# Patient Record
Sex: Female | Born: 1965 | Race: White | Hispanic: No | Marital: Single | Smoking: Never smoker
Health system: Southern US, Community
[De-identification: ages and names within clinical notes are randomized; demographics above are authoritative.]

## PROBLEM LIST (undated history)

## (undated) DIAGNOSIS — Z5189 Encounter for other specified aftercare: Secondary | ICD-10-CM

## (undated) DIAGNOSIS — D649 Anemia, unspecified: Secondary | ICD-10-CM

## (undated) DIAGNOSIS — E079 Disorder of thyroid, unspecified: Secondary | ICD-10-CM

## (undated) DIAGNOSIS — R7303 Prediabetes: Secondary | ICD-10-CM

## (undated) DIAGNOSIS — B001 Herpesviral vesicular dermatitis: Secondary | ICD-10-CM

## (undated) DIAGNOSIS — R000 Tachycardia, unspecified: Secondary | ICD-10-CM

## (undated) DIAGNOSIS — D509 Iron deficiency anemia, unspecified: Secondary | ICD-10-CM

## (undated) DIAGNOSIS — T7840XA Allergy, unspecified, initial encounter: Secondary | ICD-10-CM

## (undated) DIAGNOSIS — E049 Nontoxic goiter, unspecified: Secondary | ICD-10-CM

## (undated) DIAGNOSIS — E039 Hypothyroidism, unspecified: Secondary | ICD-10-CM

## (undated) DIAGNOSIS — C801 Malignant (primary) neoplasm, unspecified: Secondary | ICD-10-CM

## (undated) HISTORY — DX: Allergy, unspecified, initial encounter: T78.40XA

## (undated) HISTORY — DX: Prediabetes: R73.03

## (undated) HISTORY — PX: APPENDECTOMY: SHX54

## (undated) HISTORY — DX: Disorder of thyroid, unspecified: E07.9

## (undated) HISTORY — DX: Herpesviral vesicular dermatitis: B00.1

## (undated) HISTORY — DX: Nontoxic goiter, unspecified: E04.9

## (undated) HISTORY — DX: Encounter for other specified aftercare: Z51.89

## (undated) HISTORY — DX: Hypothyroidism, unspecified: E03.9

## (undated) HISTORY — DX: Anemia, unspecified: D64.9

---

## 2006-02-14 HISTORY — PX: KNEE ARTHROSCOPY: SUR90

## 2006-02-14 HISTORY — PX: KNEE SURGERY: SHX244

## 2007-01-04 HISTORY — PX: KNEE ARTHROSCOPY: SUR90

## 2008-05-15 ENCOUNTER — Inpatient Hospital Stay: Payer: Self-pay | Admitting: General Surgery

## 2008-08-09 ENCOUNTER — Emergency Department: Payer: Self-pay | Admitting: Emergency Medicine

## 2009-05-21 HISTORY — PX: BREAST BIOPSY: SHX20

## 2013-02-14 HISTORY — PX: BREAST BIOPSY: SHX20

## 2014-04-30 ENCOUNTER — Ambulatory Visit (INDEPENDENT_AMBULATORY_CARE_PROVIDER_SITE_OTHER): Payer: BC Managed Care – PPO

## 2014-04-30 ENCOUNTER — Encounter: Payer: Self-pay | Admitting: Podiatry

## 2014-04-30 ENCOUNTER — Ambulatory Visit (INDEPENDENT_AMBULATORY_CARE_PROVIDER_SITE_OTHER): Payer: BC Managed Care – PPO | Admitting: Podiatry

## 2014-04-30 VITALS — BP 119/80 | HR 82 | Resp 16 | Ht 67.5 in | Wt 165.0 lb

## 2014-04-30 DIAGNOSIS — G5782 Other specified mononeuropathies of left lower limb: Secondary | ICD-10-CM

## 2014-04-30 DIAGNOSIS — M779 Enthesopathy, unspecified: Secondary | ICD-10-CM

## 2014-04-30 DIAGNOSIS — G5762 Lesion of plantar nerve, left lower limb: Secondary | ICD-10-CM | POA: Diagnosis not present

## 2014-04-30 MED ORDER — MELOXICAM 15 MG PO TABS: 15.0000 mg | ORAL_TABLET | Freq: Every day | ORAL | Status: DC

## 2014-04-30 NOTE — Progress Notes (Signed)
   Subjective:    Patient ID: Annette Phillips, female    DOB: 1965-06-09, 49 y.o.   MRN: [id number]  HPI  It started oct/ nov i was redecorating my sons room and i was standing on a five gallon bucket . And the foot started to hurt. 4th met left foot. 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 Martin Majestic to [facility] and they did no xrays or anything. Gave me two alcohol injections my insurance will not cover that so we did a cortisone injection.  Gave me a pair of inserts that hurt more than it helped.    Review of Systems  All other systems reviewed and are negative.      Objective:   Physical Exam: I have reviewed her past history medications allergies surgery social history and review of systems. Pulses are strongly palpable bilateral. Neurologic sensorium is intact per Semmes-Weinstein monofilament. Palpable Mulder's click to the third interdigital space of the left foot with pain. Deep tendon reflexes are intact bilaterally muscle strength is 5 over 5 dorsiflexion plantar flexors and inverters everters onto the musculature is intact. Orthopedic evaluation of his result was distal to the ankle and full range of motion without crepitation. Cutaneous evaluation Mr. supple well-hydrated cutis no erythema edema cellulitis drainage or odor. Radiographs confirm normal rectus foot type without complications without fractures.        Assessment & Plan:  Assessment: Neuroma third interdigital space left foot.  Plan: We discussed the etiology pathology conservative versus surgical therapies. We discussed appropriate shoe gear stretching exercises ice therapy and shoe modifications. We discussed in great detail our options. I offered her alcohol and cortisone injections and she declined. We started her on meloxicam 15 mg 1 by mouth daily 30 with 3 refills. I also had her scan for set of orthotics. We did  discuss the possible need for surgical intervention.

## 2014-05-26 ENCOUNTER — Ambulatory Visit: Payer: BC Managed Care – PPO

## 2014-05-26 ENCOUNTER — Ambulatory Visit: Payer: BC Managed Care – PPO | Admitting: Podiatry

## 2014-05-28 ENCOUNTER — Ambulatory Visit (INDEPENDENT_AMBULATORY_CARE_PROVIDER_SITE_OTHER): Payer: BC Managed Care – PPO | Admitting: *Deleted

## 2014-05-28 DIAGNOSIS — G5782 Other specified mononeuropathies of left lower limb: Secondary | ICD-10-CM

## 2014-05-28 DIAGNOSIS — G5762 Lesion of plantar nerve, left lower limb: Secondary | ICD-10-CM

## 2014-05-28 DIAGNOSIS — M779 Enthesopathy, unspecified: Secondary | ICD-10-CM

## 2014-05-28 NOTE — Progress Notes (Signed)
Orthotics dispensed. Instructions given. Will see in 1 mo. for recheck, if needed.

## 2014-05-28 NOTE — Patient Instructions (Signed)

## 2014-06-09 ENCOUNTER — Encounter: Payer: Self-pay | Admitting: Podiatry

## 2014-06-09 ENCOUNTER — Ambulatory Visit (INDEPENDENT_AMBULATORY_CARE_PROVIDER_SITE_OTHER): Payer: BC Managed Care – PPO | Admitting: Podiatry

## 2014-06-09 VITALS — Ht 67.0 in | Wt 170.0 lb

## 2014-06-09 DIAGNOSIS — G5782 Other specified mononeuropathies of left lower limb: Secondary | ICD-10-CM

## 2014-06-09 DIAGNOSIS — G5762 Lesion of plantar nerve, left lower limb: Secondary | ICD-10-CM

## 2014-06-09 NOTE — Progress Notes (Signed)
She presents today for follow-up of her neuroma bilaterally. She states that his lungs are taken medication neuroma feels fine. However the orthotics seem to bother her particularly with metatarsal pad.  Objective: Vital signs are stable alert and oriented 3. Pulses are palpable bilateral. She has tenderness on palpation third interspace bilateral. Orthotics appear to fit well however the metatarsal pad does extend too far proximal into the mid arch.  Assessment: Neuroma bilateral.  Plan: We are sending her orthotics back to have the met pad removed. Once these return we will notify her that she may pick them up.

## 2014-06-30 ENCOUNTER — Ambulatory Visit: Payer: BC Managed Care – PPO | Admitting: Podiatry

## 2014-07-11 LAB — HM MAMMOGRAPHY

## 2015-10-15 LAB — HM COLONOSCOPY

## 2016-04-27 DIAGNOSIS — E039 Hypothyroidism, unspecified: Secondary | ICD-10-CM | POA: Insufficient documentation

## 2016-07-12 DIAGNOSIS — E785 Hyperlipidemia, unspecified: Secondary | ICD-10-CM | POA: Insufficient documentation

## 2016-12-02 ENCOUNTER — Emergency Department
Admission: EM | Admit: 2016-12-02 | Discharge: 2016-12-03 | Disposition: A | Discharge status: Home / Self Care | Payer: BC Managed Care – PPO | Attending: Emergency Medicine | Admitting: Emergency Medicine

## 2016-12-02 DIAGNOSIS — R112 Nausea with vomiting, unspecified: Secondary | ICD-10-CM | POA: Insufficient documentation

## 2016-12-02 DIAGNOSIS — R42 Dizziness and giddiness: Secondary | ICD-10-CM | POA: Diagnosis present

## 2016-12-02 DIAGNOSIS — Z79899 Other long term (current) drug therapy: Secondary | ICD-10-CM | POA: Insufficient documentation

## 2016-12-03 ENCOUNTER — Emergency Department: Payer: BC Managed Care – PPO

## 2016-12-03 LAB — URINALYSIS, COMPLETE (UACMP) WITH MICROSCOPIC
Bilirubin Urine: NEGATIVE
GLUCOSE, UA: 50 mg/dL — AB
Hgb urine dipstick: NEGATIVE
Ketones, ur: NEGATIVE mg/dL
Leukocytes, UA: NEGATIVE
NITRITE: NEGATIVE
PROTEIN: NEGATIVE mg/dL
Specific Gravity, Urine: 1.014 (ref 1.005–1.030)
pH: 8 (ref 5.0–8.0)

## 2016-12-03 LAB — COMPREHENSIVE METABOLIC PANEL
ALK PHOS: 54 U/L (ref 38–126)
ALT: 24 U/L (ref 14–54)
AST: 25 U/L (ref 15–41)
Albumin: 4.2 g/dL (ref 3.5–5.0)
Anion gap: 9 (ref 5–15)
BUN: 19 mg/dL (ref 6–20)
CALCIUM: 9 mg/dL (ref 8.9–10.3)
CO2: 25 mmol/L (ref 22–32)
CREATININE: 0.9 mg/dL (ref 0.44–1.00)
Chloride: 102 mmol/L (ref 101–111)
GLUCOSE: 158 mg/dL — AB (ref 65–99)
Potassium: 3.9 mmol/L (ref 3.5–5.1)
SODIUM: 136 mmol/L (ref 135–145)
Total Bilirubin: 0.5 mg/dL (ref 0.3–1.2)
Total Protein: 7.8 g/dL (ref 6.5–8.1)

## 2016-12-03 LAB — GLUCOSE, CAPILLARY: Glucose-Capillary: 168 mg/dL — ABNORMAL HIGH (ref 65–99)

## 2016-12-03 LAB — CBC
HEMATOCRIT: 37.1 % (ref 35.0–47.0)
Hemoglobin: 12.6 g/dL (ref 12.0–16.0)
MCH: 29.4 pg (ref 26.0–34.0)
MCHC: 34 g/dL (ref 32.0–36.0)
MCV: 86.3 fL (ref 80.0–100.0)
Platelets: 233 10*3/uL (ref 150–440)
RBC: 4.3 MIL/uL (ref 3.80–5.20)
RDW: 13.2 % (ref 11.5–14.5)
WBC: 15.7 10*3/uL — ABNORMAL HIGH (ref 3.6–11.0)

## 2016-12-03 LAB — TROPONIN I: Troponin I: <0.03 ng/mL (ref <0.03)

## 2016-12-03 LAB — LIPASE, BLOOD: Lipase: 26 U/L (ref 11–51)

## 2016-12-03 MED ORDER — MECLIZINE HCL 25 MG PO TABS: 25.0000 mg | ORAL_TABLET | Freq: Three times a day (TID) | ORAL | 0 refills | Status: DC | PRN

## 2016-12-03 MED ORDER — MECLIZINE HCL 25 MG PO TABS
25.0000 mg | ORAL_TABLET | Freq: Once | ORAL | Status: AC
  Administered 2016-12-03: 25 mg via ORAL
  Filled 2016-12-03: qty 1

## 2016-12-03 MED ORDER — ONDANSETRON HCL 4 MG/2ML IJ SOLN
INTRAMUSCULAR | Status: AC
  Administered 2016-12-03: 4 mg via INTRAVENOUS
  Filled 2016-12-03: qty 2

## 2016-12-03 MED ORDER — PROCHLORPERAZINE EDISYLATE 5 MG/ML IJ SOLN
10.0000 mg | INTRAMUSCULAR | Status: AC
  Administered 2016-12-03: 10 mg via INTRAVENOUS
  Filled 2016-12-03: qty 2

## 2016-12-03 MED ORDER — SODIUM CHLORIDE 0.9 % IV BOLUS (SEPSIS)
500.0000 mL | INTRAVENOUS | Status: AC
  Administered 2016-12-03: 500 mL via INTRAVENOUS

## 2016-12-03 MED ORDER — DIPHENHYDRAMINE HCL 50 MG/ML IJ SOLN
12.5000 mg | INTRAMUSCULAR | Status: AC
  Administered 2016-12-03: 12.5 mg via INTRAVENOUS
  Filled 2016-12-03: qty 1

## 2016-12-03 MED ORDER — ONDANSETRON HCL 4 MG/2ML IJ SOLN
4.0000 mg | Freq: Once | INTRAMUSCULAR | Status: AC
  Administered 2016-12-03: 4 mg via INTRAVENOUS

## 2016-12-03 NOTE — ED Notes (Signed)
Patient transported to MRI 

## 2016-12-03 NOTE — ED Triage Notes (Signed)
Per EMS: patient called out for nausea, emesis and dizziness. Patient had eaten dinner at 1800, began to become nauseated and vomit at approx 2300. Patient pale, diaphoretic, and clammy at EMS arrival.  Patient denies pain.

## 2016-12-03 NOTE — ED Notes (Signed)
Patient given 200 mL NaCl and 4 mg of Zofran by EMS

## 2016-12-03 NOTE — ED Notes (Signed)
Ambulated patient to the bathroom with NT Scott. Patient very unsteady on feet. MD informed

## 2016-12-03 NOTE — ED Notes (Signed)
Patient instructed that urine specimen is needed. Patient reports she will alert RN when able to give specimen

## 2016-12-03 NOTE — ED Notes (Signed)
Reviewed discharge instructions, follow-up care, prescription. Patient verbalized understanding.

## 2016-12-03 NOTE — ED Notes (Signed)
ED Provider at bedside. 

## 2016-12-03 NOTE — Discharge Instructions (Signed)
We believe your symptoms were caused by benign vertigo.  Please read through the included information and take any prescribed medication(s).  Follow up with your doctor as listed above.  If you develop any new or worsening symptoms that concern you, including but not limited to persistent dizziness/vertigo, numbness or weakness in your arms or legs, altered mental status, persistent vomiting, or fever greater than 101, please return immediately to the Emergency Department.  

## 2016-12-03 NOTE — ED Provider Notes (Signed)
[facility] Emergency Department Provider Note  ____________________________________________   First MD Initiated Contact with Patient 12/03/16 [phone]     (approximate)  I have reviewed the triage vital signs and the nursing notes.   HISTORY  Chief Complaint Emesis and Dizziness    HPI Annette Phillips is a 51 y.o. female Whose only medical history includes hypothyroidism on Synthroidwho presents by EMS for evaluation of acute onset dizziness with persistent vomiting.  This occurred a few hours ago when she was sitting on the couch and then got up and the dizziness/vertigo began acutely and was severe.  She states she has been vomiting for hours.  As long as she remains absolutely still she feels better, but any movement of her head causes her to become very nauseated, dizzy (room spinning), and to frequently vomit.  Nothing makes her symptoms better except for remaining still.  She denies any recent illnesses and denies fever/chills, chest pain, shortness of breath, abdominal pain, and dysuria.  she has never had symptoms like this before.  She has had no numbness nor tingling in any of her extremities.  She has not had any recent ear pain or infections.  she has no neck pain or stiffness.   Past Medical History:  Diagnosis Date  . Thyroid disease     There are no active problems to display for this patient.   Past Surgical History:  Procedure Laterality Date  . APPENDECTOMY    . KNEE SURGERY  2008    Prior to Admission medications   Medication Sig Start Date End Date Taking? Authorizing Provider  meclizine (ANTIVERT) 25 MG tablet Take 1 tablet (25 mg total) by mouth 3 (three) times daily as needed for dizziness. 12/03/16   Loleta Rose, MD  meloxicam (MOBIC) 15 MG tablet Take 1 tablet (15 mg total) by mouth daily. 04/30/14   Hyatt, Max T, DPM  Multiple Vitamin (MULTI-VITAMINS) TABS Take by mouth.    [provider]  SYNTHROID 100 MCG tablet   03/19/14   [provider]    Allergies Aspirin and Penicillin g  No family history on file.  Social History Social History  Substance Use Topics  . Smoking status: Never Smoker  . Smokeless tobacco: Never Used  . Alcohol use Not on file    Review of Systems Constitutional: No fever/chills Eyes: No visual changes. ENT: No sore throat. Cardiovascular: Denies chest pain. Respiratory: Denies shortness of breath. Gastrointestinal: No abdominal pain.  persistent vomiting and nausea that seems to be associated with movement of her head.  No diarrhea.  No constipation. Genitourinary: Negative for dysuria. Musculoskeletal: Negative for neck pain.  Negative for back pain. Integumentary: Negative for rash. Neurological: severe dizziness/vertigo. Negative for headaches, focal weakness or numbness.   ____________________________________________   PHYSICAL EXAM:  VITAL SIGNS: ED Triage Vitals  Enc Vitals Group     BP 12/03/16 0007 (!) 163/106     Pulse Rate 12/03/16 0007 89     Resp 12/03/16 0007 13     Temp 12/03/16 0007 97.6 F (36.4 C)     Temp Source 12/03/16 0007 Oral     SpO2 12/03/16 0001 96 %     Weight 12/03/16 0003 77.1 kg (170 lb)     Height --      Head Circumference --      Peak Flow --      Pain Score --      Pain Loc --  Pain Edu? --      Excl. in GC? --     Constitutional: Alert and oriented. appears uncomfortable and pale but nontoxic Eyes: Conjunctivae are normal. PERRL. EOMI. no nystagmus. Head: Atraumatic. Nose: No congestion/rhinnorhea. Mouth/Throat: Mucous membranes are moist. Neck: No stridor.  No meningeal signs.   Cardiovascular: Normal rate, regular rhythm. Good peripheral circulation. Grossly normal heart sounds. Respiratory: Normal respiratory effort.  No retractions. Lungs CTAB. Gastrointestinal: Soft and nontender. No distention.  Musculoskeletal: No lower extremity tenderness nor edema. No gross deformities of  extremities. Neurologic:  Normal speech and language. No gross focal neurologic deficits are appreciated. when I have the patient turn her head to the left or right she immediately becomes severely nauseated. Skin:  Skin is warm, dry and intact. No rash noted. Psychiatric: Mood and affect are normal. Speech and behavior are normal.  ____________________________________________   LABS (all labs ordered are listed, but only abnormal results are displayed)  Labs Reviewed  COMPREHENSIVE METABOLIC PANEL - Abnormal; Notable for the following:       Result Value   Glucose, Bld 158 (*)    All other components within normal limits  CBC - Abnormal; Notable for the following:    WBC 15.7 (*)    All other components within normal limits  URINALYSIS, COMPLETE (UACMP) WITH MICROSCOPIC - Abnormal; Notable for the following:    Color, Urine YELLOW (*)    APPearance CLOUDY (*)    Glucose, UA 50 (*)    Bacteria, UA RARE (*)    Squamous Epithelial / LPF 0-5 (*)    All other components within normal limits  GLUCOSE, CAPILLARY - Abnormal; Notable for the following:    Glucose-Capillary 168 (*)    All other components within normal limits  LIPASE, BLOOD  TROPONIN I  CBG MONITORING, ED   ____________________________________________  EKG  ED ECG REPORT I, Harace Mccluney, the attending physician, personally viewed and interpreted this ECG.  Date: 12/03/2016 EKG Time: 00:05 Rate: 89 Rhythm: normal sinus rhythm QRS Axis: normal Intervals: normal ST/T Wave abnormalities: normal Narrative Interpretation: Non-specific ST segment / T-wave changes, but no evidence of acute ischemia.   ____________________________________________  RADIOLOGY   Mr Angiogram Head Wo Contrast  Result Date: 12/03/2016 CLINICAL DATA:  51 y/o F; nausea, emesis, and dizziness, suspected stroke. EXAM: MRI HEAD WITHOUT CONTRAST MRA HEAD WITHOUT CONTRAST TECHNIQUE: Multiplanar, multiecho pulse sequences of the brain and  surrounding structures were obtained without intravenous contrast. Angiographic images of the head were obtained using MRA technique without contrast. COMPARISON:  None. FINDINGS: MRI HEAD FINDINGS Brain: No acute infarction, hemorrhage, hydrocephalus, extra-axial collection or mass lesion. Vascular: As below. Skull and upper cervical spine: Normal marrow signal. Sinuses/Orbits: Right maxillary sinus mucous retention cyst. Otherwise negative. Other: None. MRA HEAD FINDINGS Internal carotid arteries:  Patent. Anterior cerebral arteries: Patent bilateral ACA distributions. Large right A1, diminutive left A1, and large anterior communicating artery, normal variant. Middle cerebral arteries: Patent. Anterior communicating artery: Patent. Posterior communicating arteries: Patent right. No left identified, likely hypoplastic or absent. Posterior cerebral arteries:  Patent. Basilar artery:  Patent. Vertebral arteries:  Patent. No evidence of high-grade stenosis, large vessel occlusion, or aneurysm unless noted above. IMPRESSION: MRI head: 1. No acute intracranial abnormality. 2. Unremarkable MRI of the brain for age. MRA head Patent circle of Willis. No large vessel occlusion, aneurysm, or significant stenosis is identified. Electronically Signed   By: Mitzi HansenLance  Furusawa-Stratton M.D.   On: 12/03/2016 04:09   Mr Brain Ilda BassetWo  Contrast  Result Date: 12/03/2016 CLINICAL DATA:  51 y/o F; nausea, emesis, and dizziness, suspected stroke. EXAM: MRI HEAD WITHOUT CONTRAST MRA HEAD WITHOUT CONTRAST TECHNIQUE: Multiplanar, multiecho pulse sequences of the brain and surrounding structures were obtained without intravenous contrast. Angiographic images of the head were obtained using MRA technique without contrast. COMPARISON:  None. FINDINGS: MRI HEAD FINDINGS Brain: No acute infarction, hemorrhage, hydrocephalus, extra-axial collection or mass lesion. Vascular: As below. Skull and upper cervical spine: Normal marrow signal.  Sinuses/Orbits: Right maxillary sinus mucous retention cyst. Otherwise negative. Other: None. MRA HEAD FINDINGS Internal carotid arteries:  Patent. Anterior cerebral arteries: Patent bilateral ACA distributions. Large right A1, diminutive left A1, and large anterior communicating artery, normal variant. Middle cerebral arteries: Patent. Anterior communicating artery: Patent. Posterior communicating arteries: Patent right. No left identified, likely hypoplastic or absent. Posterior cerebral arteries:  Patent. Basilar artery:  Patent. Vertebral arteries:  Patent. No evidence of high-grade stenosis, large vessel occlusion, or aneurysm unless noted above. IMPRESSION: MRI head: 1. No acute intracranial abnormality. 2. Unremarkable MRI of the brain for age. MRA head Patent circle of Willis. No large vessel occlusion, aneurysm, or significant stenosis is identified. Electronically Signed   By: Mitzi Hansen M.D.   On: 12/03/2016 04:09    ____________________________________________   PROCEDURES  Critical Care performed: No   Procedure(s) performed:   Procedures   ____________________________________________   INITIAL IMPRESSION / ASSESSMENT AND PLAN / ED COURSE  As part of my medical decision making, I reviewed the following data within the electronic MEDICAL RECORD NUMBER Nursing notes reviewed and incorporated and Labs reviewed     Differential diagnosis includes intracranial hemorrhage, CVA/TIA, peripheral vertigo, cerebral vascular insufficiency, acute viral illness.  Less likely is an acute intra-abdominal pathology given that the patient is asymptomatic as long she is not moving.  I had my usual customary vertigo discussion with the patient and family.  Initially we will hold off on MRI given that this is most likely peripheral and I will give a dose of meclizine and some IV fluids and then reassess the need for additional medication and/or imaging.  Blood work is pending.  Clinical  Course as of Dec 04 506  Sat Dec 03, 2016  0139 patient still vomiting after2 rounds of Zofran 4 mg IV.  Given the severity of her symptoms and her difficulty even with assisted ambulation, I will check an MRI/A to rule out acute CVA and posterior circulation deficiency.  Ordering Compazine 10 mg IV with Benadryl 12.5 mg IV to assist with (thus far) intractable vomiting.  [CF]  0157 labs including UA are essentially unremarkable except for a leukocytosis of about 16.  [CF]  [phone] no evidence of acute intracranial abnormality on MRI/A MR Brain Wo Contrast [CF]  0505 the patient feels significantly better and was able to ambulate without assistance to and from the bathroom without any repeat of her vertigo/dizziness.  I updated her with the reassuring MRI findings and offered continued observation and care here at [facility] versus going home, and she prefers to go home.  Discharging with meclizine Rx and my usual/customary return precautions.  [CF]    Clinical Course User Index [CF] Loleta Rose, MD    ____________________________________________  FINAL CLINICAL IMPRESSION(S) / ED DIAGNOSES  Final diagnoses:  Vertigo     MEDICATIONS GIVEN DURING THIS VISIT:  Medications  ondansetron (ZOFRAN) injection 4 mg (4 mg Intravenous Given 12/03/16 0029)  meclizine (ANTIVERT) tablet 25 mg (25 mg Oral  Given 12/03/16 0138)  sodium chloride 0.9 % bolus 500 mL (0 mLs Intravenous Stopped 12/03/16 0230)  prochlorperazine (COMPAZINE) injection 10 mg (10 mg Intravenous Given 12/03/16 0143)  diphenhydrAMINE (BENADRYL) injection 12.5 mg (12.5 mg Intravenous Given 12/03/16 0143)     NEW OUTPATIENT MEDICATIONS STARTED DURING THIS VISIT:  New Prescriptions   MECLIZINE (ANTIVERT) 25 MG TABLET    Take 1 tablet (25 mg total) by mouth 3 (three) times daily as needed for dizziness.    Modified Medications   No medications on file    Discontinued Medications   No medications on  file     Note:  This document was prepared using Dragon voice recognition software and may include unintentional dictation errors.    Loleta Rose, MD 12/03/16 [phone]

## 2016-12-18 DIAGNOSIS — H6990 Unspecified Eustachian tube disorder, unspecified ear: Secondary | ICD-10-CM | POA: Insufficient documentation

## 2016-12-18 DIAGNOSIS — H6980 Other specified disorders of Eustachian tube, unspecified ear: Secondary | ICD-10-CM | POA: Insufficient documentation

## 2016-12-18 DIAGNOSIS — R42 Dizziness and giddiness: Secondary | ICD-10-CM | POA: Insufficient documentation

## 2017-07-13 LAB — TSH: TSH: 0.59 (ref 0.41–5.90)

## 2017-07-13 LAB — LIPID PANEL
Cholesterol: 170 (ref 0–200)
HDL: 41 (ref 35–70)
LDL Cholesterol: 106
Triglycerides: 129 (ref 40–160)

## 2017-07-13 LAB — COMPREHENSIVE METABOLIC PANEL
Albumin: 4.2 (ref 3.5–5.0)
Calcium: 9.8 (ref 8.7–10.7)
Globulin: 2.8

## 2017-07-13 LAB — HEPATIC FUNCTION PANEL
ALT: 27 (ref 7–35)
AST: 33 (ref 13–35)
Alkaline Phosphatase: 58 (ref 25–125)
Bilirubin, Total: 0.3

## 2017-07-13 LAB — BASIC METABOLIC PANEL
BUN: 21 (ref 4–21)
Creatinine: 0.8 (ref 0.5–1.1)
Glucose: 99

## 2017-07-13 LAB — CBC: RBC: 4.39 (ref 3.87–5.11)

## 2017-07-13 LAB — CBC AND DIFFERENTIAL
HCT: 39 (ref 36–46)
Hemoglobin: 12.7 (ref 12.0–16.0)
Platelets: 239 (ref 150–399)
WBC: 6.5

## 2017-07-17 LAB — HM PAP SMEAR: HM Pap smear: NORMAL

## 2017-10-25 ENCOUNTER — Ambulatory Visit (INDEPENDENT_AMBULATORY_CARE_PROVIDER_SITE_OTHER): Payer: BC Managed Care – PPO

## 2017-10-25 ENCOUNTER — Encounter: Payer: Self-pay | Admitting: Podiatry

## 2017-10-25 ENCOUNTER — Ambulatory Visit: Payer: BC Managed Care – PPO | Admitting: Podiatry

## 2017-10-25 DIAGNOSIS — M779 Enthesopathy, unspecified: Secondary | ICD-10-CM

## 2017-10-25 DIAGNOSIS — M778 Other enthesopathies, not elsewhere classified: Secondary | ICD-10-CM

## 2017-10-25 NOTE — Progress Notes (Signed)
  Subjective:  Patient ID: Annette Phillips, female    DOB: Apr 01, 1965,  MRN: [id number] HPI Chief Complaint  Patient presents with  . Foot Pain    Patient presents today for flare up of neuroma left foot under 2nd toe/forefoot x 2-3 months.  She reports the pain is stabbing and comes and goes.  She reports today is a good day.  She has been using her orthotics and hard soled shoes which help some.    52 y.o. female presents with the above complaint.   ROS: Denies fever chills nausea vomiting muscle aches pains calf pain back pain chest pain shortness of breath.  Past Medical History:  Diagnosis Date  . Thyroid disease    Past Surgical History:  Procedure Laterality Date  . APPENDECTOMY    . KNEE SURGERY  2008    Current Outpatient Medications:  Marland Kitchen  Multiple Vitamin (MULTI-VITAMINS) TABS, Take by mouth., Disp: , Rfl:  .  SYNTHROID 100 MCG tablet, , Disp: , Rfl:   Allergies  Allergen Reactions  . Aspirin Other (See Comments)  . Penicillin G Other (See Comments)   Review of Systems Objective:  There were no vitals filed for this visit.  General: Well developed, nourished, in no acute distress, alert and oriented x3   Dermatological: Skin is warm, dry and supple bilateral. Nails x 10 are well maintained; remaining integument appears unremarkable at this time. There are no open sores, no preulcerative lesions, no rash or signs of infection present.  Vascular: Dorsalis Pedis artery and Posterior Tibial artery pedal pulses are 2/4 bilateral with immedate capillary fill time. Pedal hair growth present. No varicosities and no lower extremity edema present bilateral.   Neruologic: Grossly intact via light touch bilateral. Vibratory intact via tuning fork bilateral. Protective threshold with Semmes Wienstein monofilament intact to all pedal sites bilateral. Patellar and Achilles deep tendon reflexes 2+ bilateral. No Babinski or clonus noted bilateral.   Musculoskeletal: No gross boney  pedal deformities bilateral. No pain, crepitus, or limitation noted with foot and ankle range of motion bilateral. Muscular strength 5/5 in all groups tested bilateral.  Pain on end range of motion of the second metatarsal phalangeal joint of the left foot with mild soft tissue swelling. Gait: Unassisted, Nonantalgic.    Radiographs:  No acute findings  Assessment & Plan:   Assessment: Capsulitis plantar flexed second metatarsal phalangeal joint left foot  Plan: Referred her to [facility] for a pair of orthotics to be made.  She will need to cut out beneath the second metatarsal phalangeal joint.     [place], [place]

## 2017-11-01 ENCOUNTER — Ambulatory Visit (INDEPENDENT_AMBULATORY_CARE_PROVIDER_SITE_OTHER): Payer: BC Managed Care – PPO | Admitting: Orthotics

## 2017-11-01 DIAGNOSIS — M778 Other enthesopathies, not elsewhere classified: Secondary | ICD-10-CM

## 2017-11-01 DIAGNOSIS — M779 Enthesopathy, unspecified: Secondary | ICD-10-CM | POA: Diagnosis not present

## 2017-11-01 NOTE — Progress Notes (Signed)
Patient presents today per Dr. Milinda Pointer for new CMFO; she has Richie in past and they have done her well; however they are worn out; in addition she needs more cushioning and 2nd met left

## 2017-11-22 ENCOUNTER — Ambulatory Visit: Payer: BC Managed Care – PPO | Admitting: Orthotics

## 2017-11-22 DIAGNOSIS — M779 Enthesopathy, unspecified: Principal | ICD-10-CM

## 2017-11-22 DIAGNOSIS — M778 Other enthesopathies, not elsewhere classified: Secondary | ICD-10-CM

## 2017-11-22 NOTE — Progress Notes (Addendum)
Patient came in today to pick up custom made foot orthotics.  The goals were accomplished and the patient reported no dissatisfaction with said orthotics.  Patient was advised of breakin period and how to report any issues.  Patient was unsure about met pad. If necessary we can remove.

## 2018-11-16 IMAGING — MR MR HEAD W/O CM
9 of 11 series · 32 of 48 positions shown · non-contrast
Comparison: None.

CLINICAL DATA: 50 y/o F; nausea, emesis, and dizziness, suspected
stroke.

EXAM:
MRI HEAD WITHOUT CONTRAST
MRA HEAD WITHOUT CONTRAST
TECHNIQUE: Multiplanar, multiecho pulse sequences of the brain and surrounding
structures were obtained without intravenous contrast. Angiographic
images of the head were obtained using MRA technique without
contrast.

[Series 2: GRE · sagittal · 5.0mm · 0.45mm/px · 2 of 21 slices shown (1 of 2)]
[im 1/21]
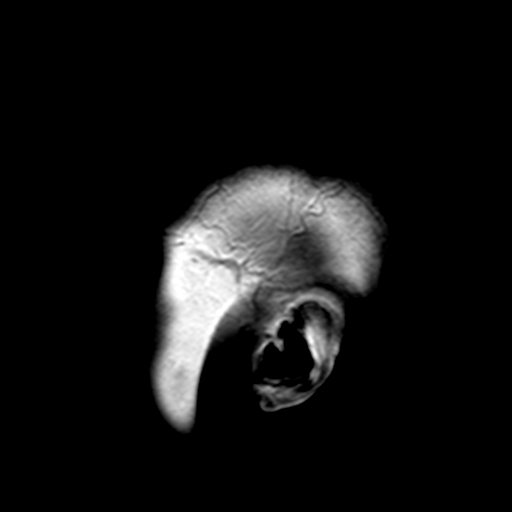
[im 21/21]
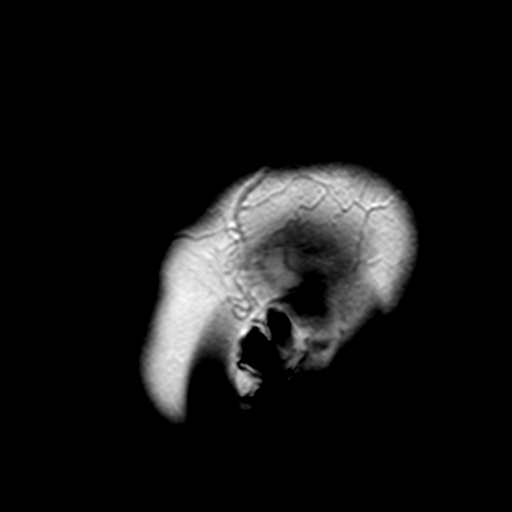

[Series 4: DWI · axial · 3.0mm · 1.80mm/px · z∈[-39,+104]mm · 5 of 48 slices shown (1 of 2)]
[im 1/48]
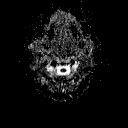
[im 12/48]
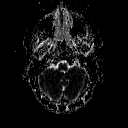
[im 24/48]
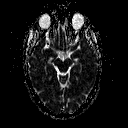
[im 36/48]
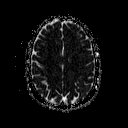
[im 48/48]
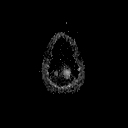

[Series 6: DWI · coronal · 3.0mm · 1.80mm/px · 4 of 44 slices shown (2 of 2)]
[im 1/44]
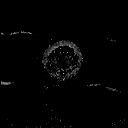
[im 15/44]
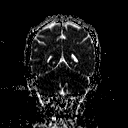
[im 29/44]
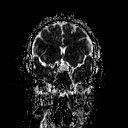
[im 44/44]
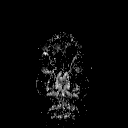

[Series 7: TOF · axial · non-contrast · 0.7mm · 0.37mm/px · z∈[-12,+17]mm · 4 of 110 slices shown]
[im 1/110]
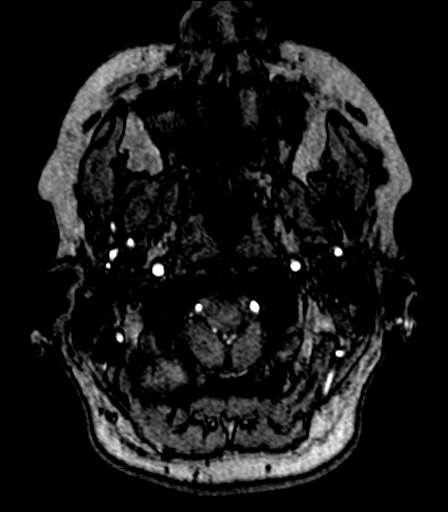
[im 22/110]
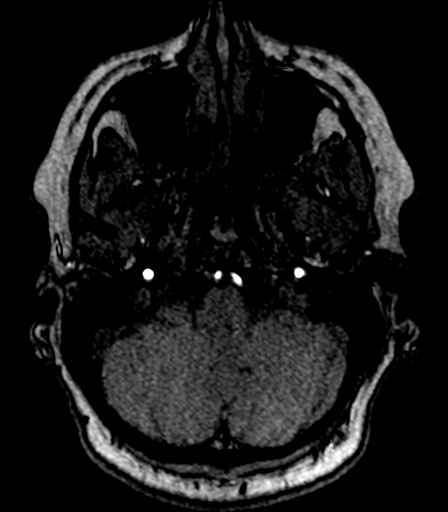
[im 33/110]
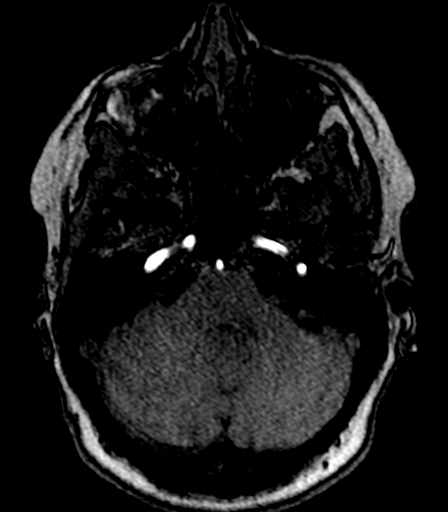
[im 44/110]
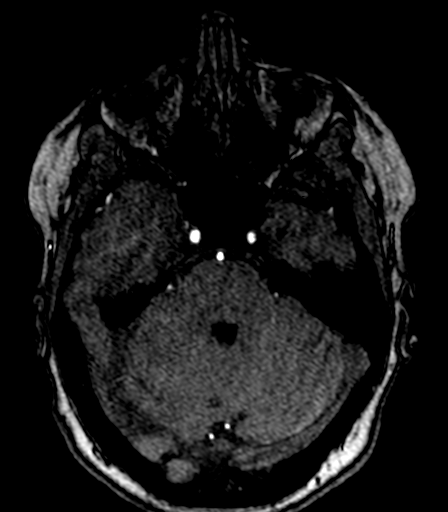

[Series 11: T2 · axial · 5.0mm · 0.45mm/px · z∈[-43,+109]mm · 3 of 25 slices shown (1 of 3)]
[im 1/25]
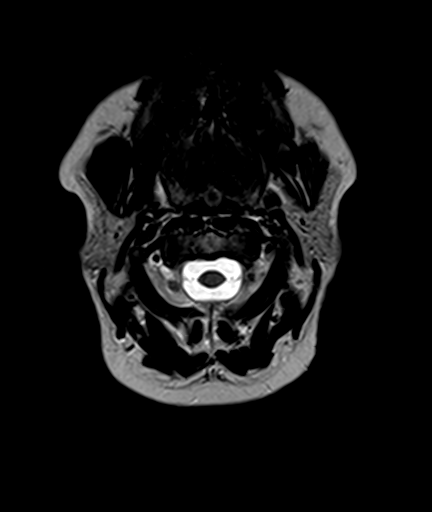
[im 13/25]
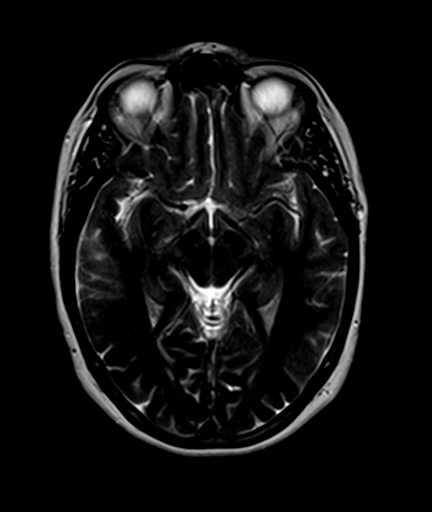
[im 25/25]
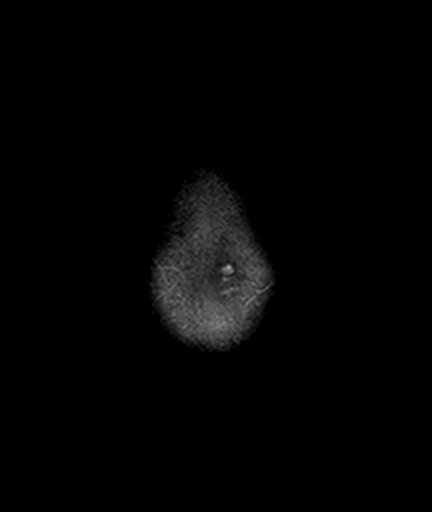

[Series 13: FLAIR · axial · 3.0mm · 0.45mm/px · z∈[-43,+109]mm · 5 of 53 slices shown]
[im 1/53]
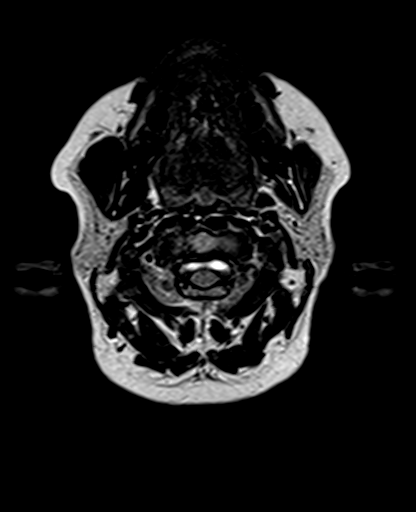
[im 14/53]
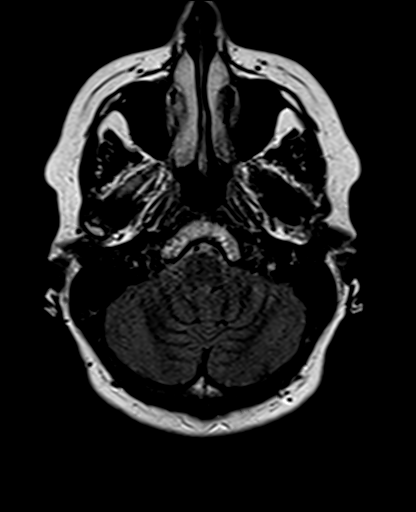
[im 27/53]
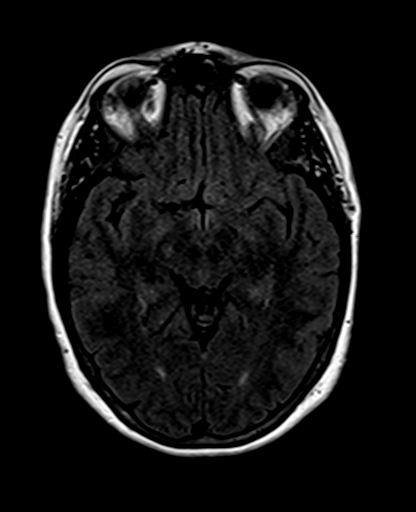
[im 40/53]
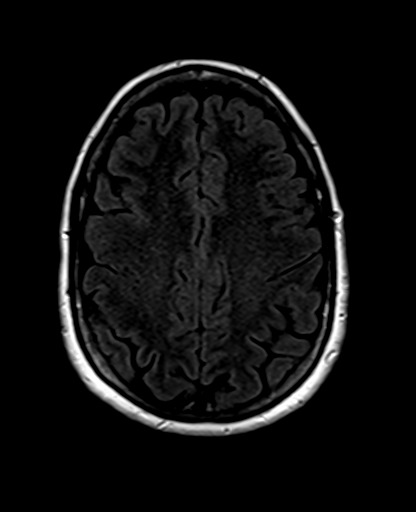
[im 53/53]
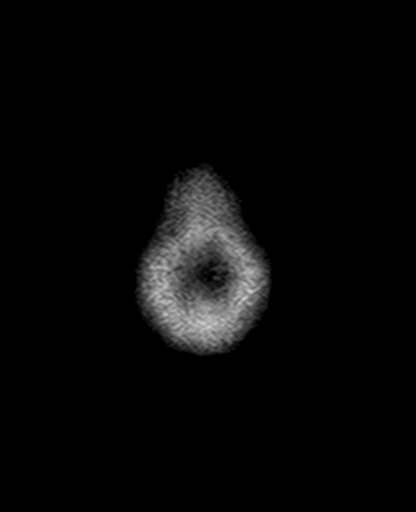

[Series 14: T2 · axial · 5.0mm · 1.20mm/px · z∈[-44,+108]mm · 3 of 25 slices shown (2 of 3)]
[im 1/25]
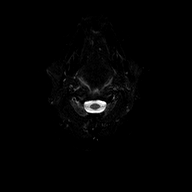
[im 13/25]
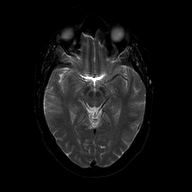
[im 25/25]
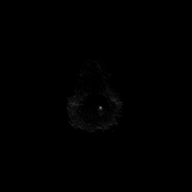

[Series 15: GRE · axial · 5.0mm · 0.45mm/px · z∈[-44,+108]mm · 3 of 25 slices shown (2 of 2)]
[im 1/25]
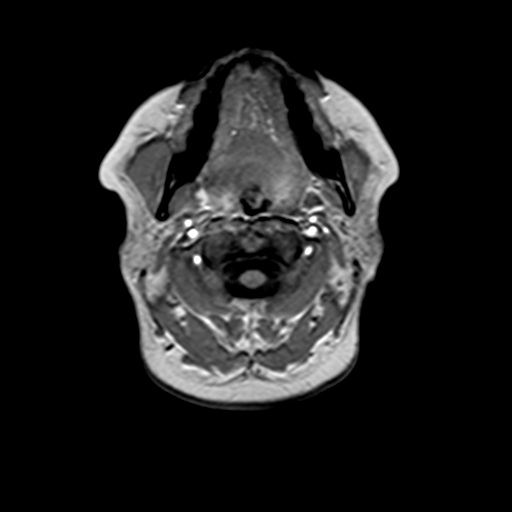
[im 13/25]
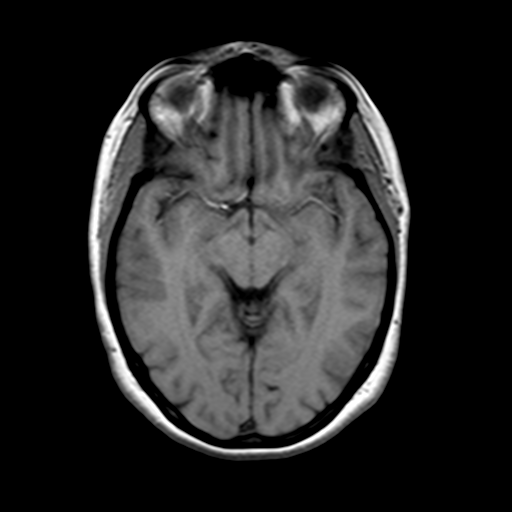
[im 25/25]
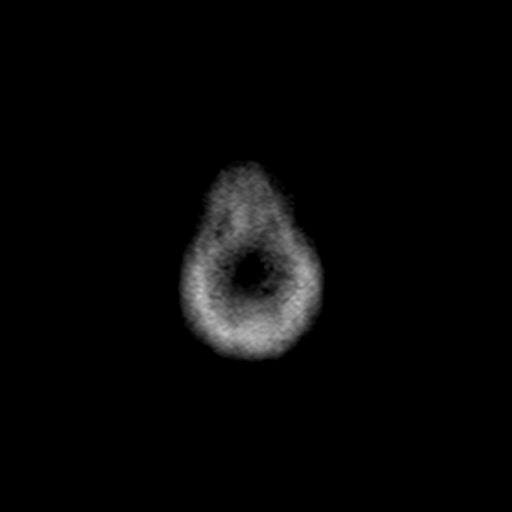

[Series 16: T2 · coronal · 5.0mm · 0.45mm/px · 3 of 27 slices shown (3 of 3)]
[im 1/27]
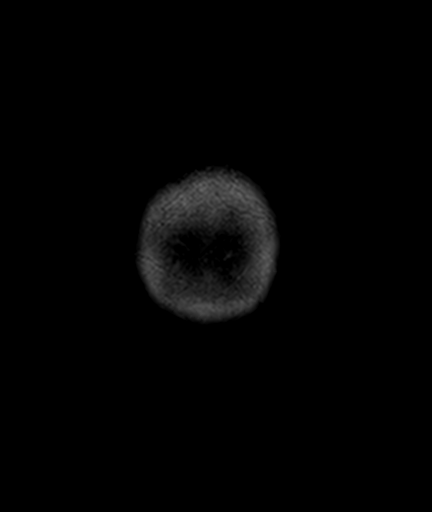
[im 14/27]
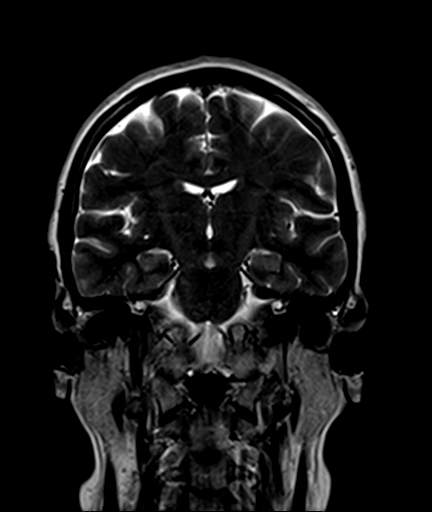
[im 27/27]
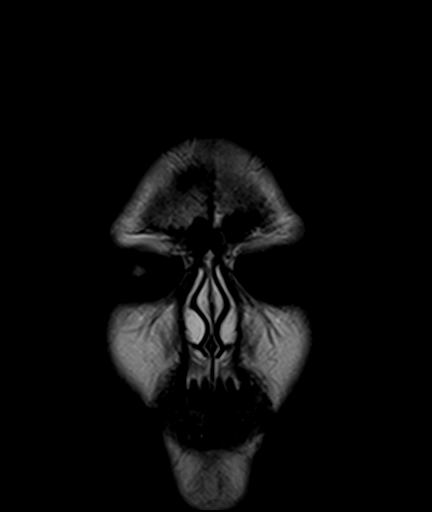

[32 of 48 positions shown; findings below may reference images not displayed]

FINDINGS: MRI HEAD FINDINGS

Brain: No acute infarction, hemorrhage, hydrocephalus, extra-axial
collection or mass lesion.

Vascular: As below.

Skull and upper cervical spine: Normal marrow signal.

Sinuses/Orbits: Right maxillary sinus mucous retention cyst.
Otherwise negative.

Other: None.

MRA HEAD FINDINGS

Internal carotid arteries:  Patent.

Anterior cerebral arteries: Patent bilateral ACA distributions.
Large right A1, diminutive left A1, and large anterior communicating
artery, normal variant.

Middle cerebral arteries: Patent.

Anterior communicating artery: Patent.

Posterior communicating arteries: Patent right. No left identified,
likely hypoplastic or absent.

Posterior cerebral arteries:  Patent.

Basilar artery:  Patent.

Vertebral arteries:  Patent.

No evidence of high-grade stenosis, large vessel occlusion, or
aneurysm unless noted above.
IMPRESSION: MRI head:

1. No acute intracranial abnormality.
2. Unremarkable MRI of the brain for age.
MRA head

Patent circle of Willis. No large vessel occlusion, aneurysm, or
significant stenosis is identified.

By: Bobsu Kytaigora M.D.

## 2019-01-30 ENCOUNTER — Encounter: Payer: Self-pay | Admitting: Family Medicine

## 2019-01-30 ENCOUNTER — Other Ambulatory Visit: Payer: Self-pay

## 2019-01-30 ENCOUNTER — Ambulatory Visit: Payer: BC Managed Care – PPO | Admitting: Family Medicine

## 2019-01-30 VITALS — BP 129/79 | HR 79 | Temp 96.9°F | Resp 16 | Ht 67.0 in | Wt 159.8 lb

## 2019-01-30 DIAGNOSIS — Z833 Family history of diabetes mellitus: Secondary | ICD-10-CM

## 2019-01-30 DIAGNOSIS — K644 Residual hemorrhoidal skin tags: Secondary | ICD-10-CM

## 2019-01-30 DIAGNOSIS — E039 Hypothyroidism, unspecified: Secondary | ICD-10-CM

## 2019-01-30 DIAGNOSIS — Z23 Encounter for immunization: Secondary | ICD-10-CM | POA: Diagnosis not present

## 2019-01-30 DIAGNOSIS — Z13220 Encounter for screening for lipoid disorders: Secondary | ICD-10-CM

## 2019-01-30 MED ORDER — HYDROCORT-PRAMOXINE (PERIANAL) 1-1 % EX FOAM: 1.0000 | Freq: Two times a day (BID) | CUTANEOUS | 3 refills | Status: DC

## 2019-01-30 NOTE — Patient Instructions (Signed)

## 2019-01-30 NOTE — Assessment & Plan Note (Signed)
Strong family history of both type II and type 1 diabetes Will check annual A1c

## 2019-01-30 NOTE — Assessment & Plan Note (Signed)
Patient with intermittently bleeding external hemorrhoids as described on exam We will first try Proctofoam twice daily If continues to struggle with bleeding despite not suffering from any constipation, we could consider having her see GI or Gen surg to discuss procedures for her hemorrhoids

## 2019-01-30 NOTE — Assessment & Plan Note (Signed)
Chronic and previously well controlled Asymptomatic Reviewed last TSH Continue Synthroid 100 mcg daily Needs brand-name Synthroid Recheck TSH today

## 2019-01-30 NOTE — Progress Notes (Signed)
Patient: Annette Phillips, Female    DOB: Mar 30, 1965, 53 y.o.   MRN: [id number] Visit Date: 01/30/2019  Today's Provider: Shirlee Latch, MD   Chief Complaint  Patient presents with  . New Patient (Initial Visit)   Subjective:    I Sulibeya S. Dimas, CMA, am acting as scribe for Shirlee Latch, MD.   Annual physical exam Annette Phillips is a 53 y.o. female who presents today to establish care. Patient reports she is transferring from [facility] in [place] (recors received and reviewed with patient).   Intermittent issues with bleeding external hemorrhoids.  More bleeding recently.  Has not used any creams or topicals.  Denies constipation. Believes these are related to past pregnancies.  Hypothyroidism: Denies any symptoms  Has been on Synthroid daily for a long time.  She takes only brand name.  Takes with good compliance.  Recurrent cold sores: Takes Valtrex prn. Seems to be related to stress. -----------------------------------------------------------------   Review of Systems  Constitutional: Negative.   HENT: Negative.   Eyes: Negative.   Respiratory: Negative.   Cardiovascular: Negative.   Gastrointestinal: Negative.   Endocrine: Negative.   Genitourinary: Negative.   Musculoskeletal: Negative.   Skin: Negative.   Allergic/Immunologic: Negative.   Neurological: Negative.   Hematological: Negative.   Psychiatric/Behavioral: Negative.     Social History      She  reports that she has never smoked. She has never used smokeless tobacco. She reports current alcohol use. She reports that she does not use drugs.       Social History   Socioeconomic History  . Marital status: Divorced    Spouse name: Not on file  . Number of children: 3  . Years of education: Not on file  . Highest education level: Not on file  Occupational History  . Not on file  Tobacco Use  . Smoking status: Never Smoker  . Smokeless tobacco: Never Used    Substance and Sexual Activity  . Alcohol use: Yes    Alcohol/week: 0.0 standard drinks    Comment: rare  . Drug use: Never  . Sexual activity: Not Currently    Birth control/protection: Post-menopausal  Other Topics Concern  . Not on file  Social History Narrative  . Not on file   Social Determinants of Health   Financial Resource Strain:   . Difficulty of Paying Living Expenses: Not on file  Food Insecurity:   . Worried About Programme researcher, broadcasting/film/video in the Last Year: Not on file  . Ran Out of Food in the Last Year: Not on file  Transportation Needs:   . Lack of Transportation (Medical): Not on file  . Lack of Transportation (Non-Medical): Not on file  Physical Activity:   . Days of Exercise per Week: Not on file  . Minutes of Exercise per Session: Not on file  Stress:   . Feeling of Stress : Not on file  Social Connections:   . Frequency of Communication with Friends and Family: Not on file  . Frequency of Social Gatherings with Friends and Family: Not on file  . Attends Religious Services: Not on file  . Active Member of Clubs or Organizations: Not on file  . Attends Banker Meetings: Not on file  . Marital Status: Not on file    Past Medical History:  Diagnosis Date  . Goiter   . Hypothyroid   . Prediabetes   . Recurrent cold sores  Patient Active Problem List   Diagnosis Date Noted  . Hypothyroid   . Eustachian tube dysfunction 12/18/2016  . Hyperlipidemia 07/12/2016    Past Surgical History:  Procedure Laterality Date  . APPENDECTOMY    . KNEE ARTHROSCOPY Left 2008   torn meniscus    Family History        Family Status  Relation Name Status  . Mother  Deceased at age 54  . Father  Alive  . Sister  Alive  . Daughter  Alive  . Son  Alive  . Son  Alive  . MGM  (Not Specified)  . MGF  (Not Specified)  . Neg Hx  (Not Specified)        Her family history includes Diabetes Mellitus I in her son; Diabetes Mellitus II in her mother;  Hypothyroidism in her daughter and son; Kidney cancer in her maternal grandmother; Lung cancer in her maternal grandfather; Thyroid disease in her father and mother. There is no history of Colon cancer or Breast cancer.      Allergies  Allergen Reactions  . Aspirin Other (See Comments)  . Penicillin G Other (See Comments)     Current Outpatient Medications:  Marland Kitchen.  Multiple Vitamin (MULTI-VITAMINS) TABS, Take by mouth., Disp: , Rfl:  .  SYNTHROID 100 MCG tablet, Take 100 mcg by mouth daily before breakfast. , Disp: , Rfl:  .  hydrocortisone-pramoxine (PROCTOFOAM-HC) rectal foam, Place 1 applicator rectally 2 (two) times daily., Disp: 10 g, Rfl: 3   Patient Care Team: Erasmo DownerBacigalupo, Tulsi Crossett M, MD as PCP - General (Family Medicine)    Objective:    Vitals: BP 129/79 (BP Location: Left Arm, Patient Position: Sitting, Cuff Size: Normal)   Pulse 79   Temp (!) 96.9 F (36.1 C) (Temporal)   Resp 16   Ht 5\' 7"  (1.702 m)   Wt 159 lb 12.8 oz (72.5 kg)   BMI 25.03 kg/m    Vitals:   01/30/19 1012  BP: 129/79  Pulse: 79  Resp: 16  Temp: (!) 96.9 F (36.1 C)  TempSrc: Temporal  Weight: 159 lb 12.8 oz (72.5 kg)  Height: 5\' 7"  (1.702 m)     Physical Exam Vitals reviewed.  Constitutional:      General: She is not in acute distress.    Appearance: Normal appearance. She is well-developed. She is not diaphoretic.  HENT:     Head: Normocephalic and atraumatic.     Right Ear: Tympanic membrane, ear canal and external ear normal.     Left Ear: Tympanic membrane, ear canal and external ear normal.  Eyes:     General: No scleral icterus.    Conjunctiva/sclera: Conjunctivae normal.     Pupils: Pupils are equal, round, and reactive to light.  Neck:     Thyroid: No thyromegaly.  Cardiovascular:     Rate and Rhythm: Normal rate and regular rhythm.     Pulses: Normal pulses.     Heart sounds: Normal heart sounds. No murmur.  Pulmonary:     Effort: Pulmonary effort is normal. No respiratory  distress.     Breath sounds: Normal breath sounds. No wheezing or rales.  Abdominal:     General: There is no distension.     Palpations: Abdomen is soft.     Tenderness: There is no abdominal tenderness.  Genitourinary:    Comments: Rectal exam: +external hemorrhoids, not thrombosed or actively bleeding. Normal rectal tone. Small internal hemorrhoids palpable. Musculoskeletal:  General: No deformity.     Cervical back: Neck supple.     Right lower leg: No edema.     Left lower leg: No edema.  Lymphadenopathy:     Cervical: No cervical adenopathy.  Skin:    General: Skin is warm and dry.     Capillary Refill: Capillary refill takes less than 2 seconds.     Findings: No rash.  Neurological:     Mental Status: She is alert and oriented to person, place, and time. Mental status is at baseline.  Psychiatric:        Mood and Affect: Mood normal.        Behavior: Behavior normal.        Thought Content: Thought content normal.      Depression Screen PHQ 2/9 Scores 01/30/2019  PHQ - 2 Score 0  PHQ- 9 Score 0      Assessment & Plan:     Establish care  Exercise Activities and Dietary recommendations Goals   None     Immunization History  Administered Date(s) Administered  . Td 01/30/2019    Health Maintenance  Topic Date Due  . HIV Screening  01/12/1980  . PAP SMEAR-Modifier  01/11/1986  . MAMMOGRAM  01/12/2015  . COLONOSCOPY  01/12/2015  . INFLUENZA VACCINE  05/15/2019 (Originally 09/15/2018)  . TETANUS/TDAP  01/29/2029     Discussed health benefits of physical activity, and encouraged her to engage in regular exercise appropriate for her age and condition.     Last colonoscopy in 2017.  Normal and recommended to repeat in 10 years.  Reviewed with the patient and will be abstracted to the chart Last Pap smear from 2019 also reviewed.  It is normal.  It will be abstracted to the chart. Patient reports she has not had a mammogram for many years.  She is  not interested in getting a mammogram at this time. Patient declines flu shot --------------------------------------------------------------------  Problem List Items Addressed This Visit      Cardiovascular and Mediastinum   External hemorrhoid, bleeding    Patient with intermittently bleeding external hemorrhoids as described on exam We will first try Proctofoam twice daily If continues to struggle with bleeding despite not suffering from any constipation, we could consider having her see GI or Gen surg to discuss procedures for her hemorrhoids      Relevant Orders   CBC   CMP (Comprehensive metabolic panel)     Endocrine   Hypothyroid - Primary    Chronic and previously well controlled Asymptomatic Reviewed last TSH Continue Synthroid 100 mcg daily Needs brand-name Synthroid Recheck TSH today      Relevant Orders   TSH   CBC   CMP (Comprehensive metabolic panel)     Other   Family history of diabetes mellitus    Strong family history of both type II and type 1 diabetes Will check annual A1c      Relevant Orders   Hemoglobin A1c    Other Visit Diagnoses    Screening for lipid disorders       Relevant Orders   Lipid panel   CBC   CMP (Comprehensive metabolic panel)   Need for Td vaccine       Relevant Orders   Td : Tetanus/diphtheria >7yo Preservative  free (Completed)       Return in about 6 months (around 07/31/2019) for CPE.   The entirety of the information documented in the History of Present Illness, Review of  Systems and Physical Exam were personally obtained by me. Portions of this information were initially documented by Rondel Baton, CMA and reviewed by me for thoroughness and accuracy.    Adonys Wildes, Marzella Schlein, MD MPH [facility]

## 2019-01-31 ENCOUNTER — Telehealth: Payer: Self-pay

## 2019-01-31 LAB — LIPID PANEL
Chol/HDL Ratio: 4.4 ratio (ref 0.0–4.4)
Cholesterol, Total: 157 mg/dL (ref 100–199)
HDL: 36 mg/dL — ABNORMAL LOW (ref >39)
LDL Chol Calc (NIH): 96 mg/dL (ref 0–99)
Triglycerides: 143 mg/dL (ref 0–149)
VLDL Cholesterol Cal: 25 mg/dL (ref 5–40)

## 2019-01-31 LAB — COMPREHENSIVE METABOLIC PANEL
ALT: 299 IU/L — ABNORMAL HIGH (ref 0–32)
AST: 177 IU/L — ABNORMAL HIGH (ref 0–40)
Albumin/Globulin Ratio: 1.7 (ref 1.2–2.2)
Albumin: 4.4 g/dL (ref 3.8–4.9)
Alkaline Phosphatase: 77 IU/L (ref 39–117)
BUN/Creatinine Ratio: 26 — ABNORMAL HIGH (ref 9–23)
BUN: 19 mg/dL (ref 6–24)
Bilirubin Total: 0.6 mg/dL (ref 0.0–1.2)
CO2: 23 mmol/L (ref 20–29)
Calcium: 9.8 mg/dL (ref 8.7–10.2)
Chloride: 102 mmol/L (ref 96–106)
Creatinine, Ser: 0.73 mg/dL (ref 0.57–1.00)
GFR calc Af Amer: 108 mL/min/{1.73_m2} (ref >59)
GFR calc non Af Amer: 94 mL/min/{1.73_m2} (ref >59)
Globulin, Total: 2.6 g/dL (ref 1.5–4.5)
Glucose: 105 mg/dL — ABNORMAL HIGH (ref 65–99)
Potassium: 4.2 mmol/L (ref 3.5–5.2)
Sodium: 138 mmol/L (ref 134–144)
Total Protein: 7 g/dL (ref 6.0–8.5)

## 2019-01-31 LAB — HEMOGLOBIN A1C
Est. average glucose Bld gHb Est-mCnc: 120 mg/dL
Hgb A1c MFr Bld: 5.8 % — ABNORMAL HIGH (ref 4.8–5.6)

## 2019-01-31 LAB — CBC
Hematocrit: 36.3 % (ref 34.0–46.6)
Hemoglobin: 12.2 g/dL (ref 11.1–15.9)
MCH: 28.7 pg (ref 26.6–33.0)
MCHC: 33.6 g/dL (ref 31.5–35.7)
MCV: 85 fL (ref 79–97)
Platelets: 223 10*3/uL (ref 150–450)
RBC: 4.25 x10E6/uL (ref 3.77–5.28)
RDW: 12.9 % (ref 11.7–15.4)
WBC: 6.5 10*3/uL (ref 3.4–10.8)

## 2019-01-31 LAB — TSH: TSH: 0.098 u[IU]/mL — ABNORMAL LOW (ref 0.450–4.500)

## 2019-01-31 NOTE — Telephone Encounter (Signed)
lmtcb

## 2019-01-31 NOTE — Telephone Encounter (Signed)
-----   Message from Virginia Crews, MD sent at 01/31/2019  8:47 AM EST ----- Normal blood counts, cholesterol, kidney function, electrolytes.  A1c, 40-month average of blood sugars, is in the prediabetic range.  In order to prevent progression to diabetes, recommend low carb diet and regular exercise.  TSH is quite low, suggesting that Synthroid dose is too high and she is overcorrected.  I know that she said she hasn't felt well when Synthroid dose has been changed in the past, but this dose is too high. We can make a small change by alternating 166mcg and 55mcg every other day.  Would recheck TSH in 2 months after change is made.  AST and ALT (liver function tests) are elevated and have increased significantly since last labs were checked.  Recommend holding any Tylenol or alcohol intake and staying well-hydrated.  Recommend rechecking in 2 weeks and if still elevated, we should get more labs to look into this further.

## 2019-02-02 ENCOUNTER — Other Ambulatory Visit: Payer: Self-pay | Admitting: Family Medicine

## 2019-02-03 NOTE — Telephone Encounter (Signed)
Requested medication (s) are due for refill today: no  Requested medication (s) are on the active medication list: yes  Last refill:  10/11/2018  Future visit scheduled: yes  Notes to clinic: review for refill Looks like dose maybe changed   Requested Prescriptions  Pending Prescriptions Disp Refills   SYNTHROID 100 MCG tablet [Pharmacy Med Name: SYNTHROID 100 MCG TABLET] 90 tablet 0    Sig: TAKE 1 TABLET BY MOUTH EVERY DAY (NEEDS OFFICE VISIT FOR FUTURE REFILL)      Endocrinology:  Hypothyroid Agents Failed - 02/02/2019  1:03 PM      Failed - TSH needs to be rechecked within 3 months after an abnormal result. Refill until TSH is due.      Failed - TSH in normal range and within 360 days    TSH  Date Value Ref Range Status  01/30/2019 0.098 (L) 0.450 - 4.500 uIU/mL Final          Passed - Valid encounter within last 12 months    Recent Outpatient Visits           4 days ago Acquired hypothyroidism   [facility], Dionne Bucy, MD

## 2019-02-04 NOTE — Telephone Encounter (Signed)
Patient advised as below. Patient refuses to change Synthroid dose. Patient is requesting an alternative for Tylenol in case she has a headache. Patient reports she can not take Ibuprofen. Please advise.

## 2019-02-04 NOTE — Telephone Encounter (Signed)
Left detailed message on voicemail.  

## 2019-02-04 NOTE — Telephone Encounter (Signed)
Ok to take Tylenol rarely, just not every day

## 2019-02-20 ENCOUNTER — Telehealth: Payer: Self-pay

## 2019-02-20 DIAGNOSIS — R748 Abnormal levels of other serum enzymes: Secondary | ICD-10-CM

## 2019-02-20 DIAGNOSIS — R7989 Other specified abnormal findings of blood chemistry: Secondary | ICD-10-CM

## 2019-02-20 NOTE — Telephone Encounter (Signed)
Orders placed.  Patient advised.

## 2019-02-20 NOTE — Telephone Encounter (Signed)
Probably easiest to just order hepatic function panel instead of AST/ALT.  Order for elevated liver enzymes. Thanks!

## 2019-02-20 NOTE — Telephone Encounter (Signed)
Order for ALT and AST pulled. Please advise if these are the correct test that you wanted to have rechecked at this time.     Copied from CRM [id number]. Topic: General - Inquiry >> Feb 20, 2019 12:07 PM Crist Infante wrote: Reason for CRM: pt would like an order for labs that Dr B said need to be repeated. Pt would like to come in the morning.  Can you put the orders in? Ok to [facility] that orders are in.

## 2019-02-22 ENCOUNTER — Ambulatory Visit: Payer: Self-pay

## 2019-02-22 ENCOUNTER — Telehealth: Payer: Self-pay

## 2019-02-22 ENCOUNTER — Encounter: Payer: Self-pay | Admitting: Family Medicine

## 2019-02-22 DIAGNOSIS — R7989 Other specified abnormal findings of blood chemistry: Secondary | ICD-10-CM

## 2019-02-22 LAB — HEPATIC FUNCTION PANEL
ALT: 278 IU/L — ABNORMAL HIGH (ref 0–32)
AST: 211 IU/L — ABNORMAL HIGH (ref 0–40)
Albumin: 4 g/dL (ref 3.8–4.9)
Alkaline Phosphatase: 83 IU/L (ref 39–117)
Bilirubin Total: 0.8 mg/dL (ref 0.0–1.2)
Bilirubin, Direct: 0.33 mg/dL (ref 0.00–0.40)
Total Protein: 6.6 g/dL (ref 6.0–8.5)

## 2019-02-22 NOTE — Telephone Encounter (Signed)
-----   Message from Angela M Bacigalupo, MD sent at 02/22/2019  9:12 AM EST ----- Liver function tests continue to rise.  Time to see GI.  Ok to place referral if patient agrees. 

## 2019-02-22 NOTE — Telephone Encounter (Signed)
-----   Message from Erasmo Downer, MD sent at 02/22/2019  9:12 AM EST ----- Liver function tests continue to rise.  Time to see GI.  Ok to place referral if patient agrees.

## 2019-02-22 NOTE — Telephone Encounter (Signed)
LMTCB

## 2019-02-22 NOTE — Telephone Encounter (Signed)
Patient states that she responded per my chart that she agrees.   Place  Referral.. Also request that communication be done via cell phone.

## 2019-02-22 NOTE — Telephone Encounter (Signed)
Referral placed.

## 2019-02-25 ENCOUNTER — Encounter: Payer: Self-pay | Admitting: Family Medicine

## 2019-02-25 ENCOUNTER — Other Ambulatory Visit: Payer: Self-pay

## 2019-02-25 DIAGNOSIS — R7989 Other specified abnormal findings of blood chemistry: Secondary | ICD-10-CM

## 2019-02-25 DIAGNOSIS — R112 Nausea with vomiting, unspecified: Secondary | ICD-10-CM

## 2019-02-25 DIAGNOSIS — R748 Abnormal levels of other serum enzymes: Secondary | ICD-10-CM

## 2019-02-25 NOTE — Telephone Encounter (Signed)
Patient advised as below.  

## 2019-02-25 NOTE — Telephone Encounter (Signed)
Can we please order lipase and abdominal US for this patient? And then let her know that it has been ordered to look closer at her liver and pancreas.  If she would like something for nausea, we can give Zofran 4mg  q8h prn #30 r0.  Eat bland diet and stay hydrated.  If she wants to discuss further, we can set up virtual visit. Thanks!

## 2019-02-26 ENCOUNTER — Inpatient Hospital Stay
Admission: EM | Admit: 2019-02-26 | Discharge: 2019-03-01 | DRG: 358 | Disposition: A | Discharge status: Home / Self Care | Payer: BC Managed Care – PPO | Source: Ambulatory Visit | Attending: Family Medicine | Admitting: Family Medicine

## 2019-02-26 ENCOUNTER — Emergency Department: Payer: BC Managed Care – PPO

## 2019-02-26 ENCOUNTER — Other Ambulatory Visit: Payer: Self-pay

## 2019-02-26 ENCOUNTER — Ambulatory Visit: Payer: Self-pay | Admitting: *Deleted

## 2019-02-26 DIAGNOSIS — R7401 Elevation of levels of liver transaminase levels: Secondary | ICD-10-CM | POA: Diagnosis present

## 2019-02-26 DIAGNOSIS — K209 Esophagitis, unspecified without bleeding: Secondary | ICD-10-CM | POA: Diagnosis not present

## 2019-02-26 DIAGNOSIS — R591 Generalized enlarged lymph nodes: Secondary | ICD-10-CM | POA: Diagnosis present

## 2019-02-26 DIAGNOSIS — K824 Cholesterolosis of gallbladder: Secondary | ICD-10-CM | POA: Diagnosis not present

## 2019-02-26 DIAGNOSIS — D649 Anemia, unspecified: Secondary | ICD-10-CM | POA: Diagnosis present

## 2019-02-26 DIAGNOSIS — K648 Other hemorrhoids: Secondary | ICD-10-CM | POA: Diagnosis present

## 2019-02-26 DIAGNOSIS — R112 Nausea with vomiting, unspecified: Secondary | ICD-10-CM

## 2019-02-26 DIAGNOSIS — K644 Residual hemorrhoidal skin tags: Secondary | ICD-10-CM | POA: Diagnosis present

## 2019-02-26 DIAGNOSIS — Z886 Allergy status to analgesic agent status: Secondary | ICD-10-CM

## 2019-02-26 DIAGNOSIS — Z20822 Contact with and (suspected) exposure to covid-19: Secondary | ICD-10-CM | POA: Diagnosis present

## 2019-02-26 DIAGNOSIS — K449 Diaphragmatic hernia without obstruction or gangrene: Secondary | ICD-10-CM | POA: Diagnosis present

## 2019-02-26 DIAGNOSIS — Z880 Allergy status to penicillin: Secondary | ICD-10-CM

## 2019-02-26 DIAGNOSIS — Z833 Family history of diabetes mellitus: Secondary | ICD-10-CM

## 2019-02-26 DIAGNOSIS — Z8349 Family history of other endocrine, nutritional and metabolic diseases: Secondary | ICD-10-CM

## 2019-02-26 DIAGNOSIS — K660 Peritoneal adhesions (postprocedural) (postinfection): Secondary | ICD-10-CM | POA: Diagnosis present

## 2019-02-26 DIAGNOSIS — E039 Hypothyroidism, unspecified: Secondary | ICD-10-CM | POA: Diagnosis present

## 2019-02-26 DIAGNOSIS — Z79890 Hormone replacement therapy (postmenopausal): Secondary | ICD-10-CM

## 2019-02-26 LAB — COMPREHENSIVE METABOLIC PANEL
ALT: 204 U/L — ABNORMAL HIGH (ref 0–44)
AST: 135 U/L — ABNORMAL HIGH (ref 15–41)
Albumin: 4.3 g/dL (ref 3.5–5.0)
Alkaline Phosphatase: 67 U/L (ref 38–126)
Anion gap: 9 (ref 5–15)
BUN: 21 mg/dL — ABNORMAL HIGH (ref 6–20)
CO2: 24 mmol/L (ref 22–32)
Calcium: 9.5 mg/dL (ref 8.9–10.3)
Chloride: 103 mmol/L (ref 98–111)
Creatinine, Ser: 0.76 mg/dL (ref 0.44–1.00)
GFR calc Af Amer: >60 mL/min (ref >60)
GFR calc non Af Amer: >60 mL/min (ref >60)
Glucose, Bld: 118 mg/dL — ABNORMAL HIGH (ref 70–99)
Potassium: 3.7 mmol/L (ref 3.5–5.1)
Sodium: 136 mmol/L (ref 135–145)
Total Bilirubin: 1.2 mg/dL (ref 0.3–1.2)
Total Protein: 8 g/dL (ref 6.5–8.1)

## 2019-02-26 LAB — URINALYSIS, COMPLETE (UACMP) WITH MICROSCOPIC
Bilirubin Urine: NEGATIVE
Glucose, UA: NEGATIVE mg/dL
Hgb urine dipstick: NEGATIVE
Ketones, ur: NEGATIVE mg/dL
Leukocytes,Ua: NEGATIVE
Nitrite: NEGATIVE
Protein, ur: NEGATIVE mg/dL
Specific Gravity, Urine: 1.02 (ref 1.005–1.030)
pH: 5 (ref 5.0–8.0)

## 2019-02-26 LAB — CBC
HCT: 34.7 % — ABNORMAL LOW (ref 36.0–46.0)
Hemoglobin: 11.4 g/dL — ABNORMAL LOW (ref 12.0–15.0)
MCH: 28.4 pg (ref 26.0–34.0)
MCHC: 32.9 g/dL (ref 30.0–36.0)
MCV: 86.3 fL (ref 80.0–100.0)
Platelets: 265 10*3/uL (ref 150–400)
RBC: 4.02 MIL/uL (ref 3.87–5.11)
RDW: 13.6 % (ref 11.5–15.5)
WBC: 7.6 10*3/uL (ref 4.0–10.5)
nRBC: 0 % (ref 0.0–0.2)

## 2019-02-26 LAB — TSH: TSH: 0.027 u[IU]/mL — ABNORMAL LOW (ref 0.350–4.500)

## 2019-02-26 LAB — LIPASE, BLOOD: Lipase: 37 U/L (ref 11–51)

## 2019-02-26 MED ORDER — ACETAMINOPHEN 325 MG PO TABS
650.0000 mg | ORAL_TABLET | Freq: Four times a day (QID) | ORAL | Status: DC | PRN
  Administered 2019-02-27: 650 mg via ORAL
  Filled 2019-02-26: qty 2

## 2019-02-26 MED ORDER — ONDANSETRON HCL 4 MG/2ML IJ SOLN: 4.0000 mg | Freq: Once | INTRAMUSCULAR | Status: AC

## 2019-02-26 MED ORDER — SODIUM CHLORIDE 0.9 % IV SOLN: INTRAVENOUS | Status: DC

## 2019-02-26 MED ORDER — MORPHINE SULFATE (PF) 2 MG/ML IV SOLN
2.0000 mg | Freq: Once | INTRAVENOUS | Status: AC
  Administered 2019-02-26: 2 mg via INTRAVENOUS
  Filled 2019-02-26: qty 1

## 2019-02-26 MED ORDER — SODIUM CHLORIDE 0.9 % IV BOLUS
1000.0000 mL | Freq: Once | INTRAVENOUS | Status: AC
  Administered 2019-02-26: 1000 mL via INTRAVENOUS

## 2019-02-26 MED ORDER — PANTOPRAZOLE SODIUM 40 MG IV SOLR
40.0000 mg | Freq: Two times a day (BID) | INTRAVENOUS | Status: DC
  Administered 2019-02-26 – 2019-03-01 (×6): 40 mg via INTRAVENOUS
  Filled 2019-02-26 (×10): qty 40

## 2019-02-26 MED ORDER — ONDANSETRON HCL 4 MG PO TABS: 4.0000 mg | ORAL_TABLET | Freq: Four times a day (QID) | ORAL | Status: DC | PRN

## 2019-02-26 MED ORDER — ACETAMINOPHEN 650 MG RE SUPP: 650.0000 mg | Freq: Four times a day (QID) | RECTAL | Status: DC | PRN

## 2019-02-26 MED ORDER — HEPARIN SODIUM (PORCINE) 5000 UNIT/ML IJ SOLN
5000.0000 [IU] | Freq: Three times a day (TID) | INTRAMUSCULAR | Status: DC
  Administered 2019-02-26 – 2019-02-27 (×3): 5000 [IU] via SUBCUTANEOUS
  Filled 2019-02-26 (×2): qty 1

## 2019-02-26 MED ORDER — ONDANSETRON HCL 4 MG/2ML IJ SOLN
4.0000 mg | Freq: Once | INTRAMUSCULAR | Status: AC
  Administered 2019-02-26: 4 mg via INTRAVENOUS
  Filled 2019-02-26: qty 2

## 2019-02-26 MED ORDER — IOHEXOL 300 MG/ML  SOLN
100.0000 mL | Freq: Once | INTRAMUSCULAR | Status: AC | PRN
  Administered 2019-02-26: 100 mL via INTRAVENOUS
  Filled 2019-02-26: qty 100

## 2019-02-26 MED ORDER — SUCRALFATE 1 GM/10ML PO SUSP
1.0000 g | Freq: Three times a day (TID) | ORAL | Status: DC
  Administered 2019-02-27 – 2019-03-01 (×6): 1 g via ORAL
  Filled 2019-02-26 (×13): qty 10

## 2019-02-26 MED ORDER — ALUM & MAG HYDROXIDE-SIMETH 200-200-20 MG/5ML PO SUSP
30.0000 mL | Freq: Once | ORAL | Status: AC
  Administered 2019-02-26: 30 mL via ORAL
  Filled 2019-02-26: qty 30

## 2019-02-26 MED ORDER — SODIUM CHLORIDE 0.9% FLUSH
3.0000 mL | Freq: Once | INTRAVENOUS | Status: AC
  Administered 2019-02-28: 3 mL via INTRAVENOUS

## 2019-02-26 MED ORDER — SUCRALFATE 1 G PO TABS
1.0000 g | ORAL_TABLET | Freq: Once | ORAL | Status: AC
  Administered 2019-02-26: 1 g via ORAL
  Filled 2019-02-26: qty 1

## 2019-02-26 MED ORDER — ONDANSETRON HCL 4 MG/2ML IJ SOLN
4.0000 mg | Freq: Four times a day (QID) | INTRAMUSCULAR | Status: DC | PRN
  Administered 2019-02-26 – 2019-02-27 (×2): 4 mg via INTRAVENOUS
  Filled 2019-02-26 (×3): qty 2

## 2019-02-26 MED ORDER — LEVOTHYROXINE SODIUM 100 MCG PO TABS
100.0000 ug | ORAL_TABLET | Freq: Every day | ORAL | Status: DC
  Administered 2019-02-27 – 2019-02-28 (×2): 100 ug via ORAL
  Filled 2019-02-26 (×3): qty 1
  Filled 2019-02-26: qty 2
  Filled 2019-02-26: qty 1

## 2019-02-26 MED ORDER — SENNOSIDES-DOCUSATE SODIUM 8.6-50 MG PO TABS
1.0000 | ORAL_TABLET | Freq: Every evening | ORAL | Status: DC | PRN
  Filled 2019-02-26: qty 1

## 2019-02-26 MED ORDER — LIDOCAINE VISCOUS HCL 2 % MT SOLN
15.0000 mL | Freq: Once | OROMUCOSAL | Status: AC
  Administered 2019-02-26: 15 mL via ORAL
  Filled 2019-02-26: qty 15

## 2019-02-26 MED ORDER — FENTANYL CITRATE (PF) 100 MCG/2ML IJ SOLN
50.0000 ug | INTRAMUSCULAR | Status: AC | PRN
  Administered 2019-02-26 – 2019-02-27 (×2): 50 ug via INTRAVENOUS
  Filled 2019-02-26 (×2): qty 2

## 2019-02-26 MED ORDER — ONDANSETRON HCL 4 MG/2ML IJ SOLN
INTRAMUSCULAR | Status: AC
  Administered 2019-02-26: 4 mg via INTRAVENOUS
  Filled 2019-02-26: qty 2

## 2019-02-26 MED ORDER — MORPHINE SULFATE (PF) 2 MG/ML IV SOLN
2.0000 mg | INTRAVENOUS | Status: DC | PRN
  Administered 2019-02-26 – 2019-02-27 (×2): 2 mg via INTRAVENOUS
  Filled 2019-02-26: qty 1

## 2019-02-26 NOTE — H&P (Signed)
History and Physical    Annette Phillips FIE:[id number] DOB: [birth year] DOA: 02/26/2019  PCP: Erasmo Downer, MD Patient coming from: Home  Chief Complaint: Nausea and abdominal pain  HPI: Annette Phillips is a 54 y.o. female with medical history significant of history of hypothyroidism, external hemorrhoids comes to the hospital with complaints of epigastric and right upper quadrant abdominal pain along with nausea.  Patient states she has been feeling nauseous for the past week but started having sharp burning epigastric and right upper quadrant abdominal pain, reports mainly happens 30 minutes after eating any type of food.  This progressively worsened over the course the past 24 hours therefore called her PCP who sent her to the hospital for further evaluation.  On her routine lab work she was noted to have elevated LFTs last month and was told she will be was referred to GI.  She had colonoscopy when she turned 49 which was normal but never had endoscopic evaluation.  Denies any weight loss, tattoos, new medications and other risk factors Denies any IV drug abuse, tobacco use.  Drinks alcohol rarely maybe 2-3 times a year.  In the ER her work-up showed mildly elevated ALT/AST with normal bilirubin.  CT of the abdomen pelvis showed esophagitis with surrounding lymphadenopathy and gallbladder polyps.  Due to intractable nausea with poor oral tolerance she was admitted to the hospital.  GI and general surgery were notified for their input.   Review of Systems: As per HPI otherwise 10 point review of systems negative.  Review of Systems Otherwise negative except as per HPI, including: General: Denies fever, chills, night sweats or unintended weight loss. Resp: Denies cough, wheezing, shortness of breath. Cardiac: Denies chest pain, palpitations, orthopnea, paroxysmal nocturnal dyspnea. GI: Denies  vomiting, diarrhea or constipation GU: Denies dysuria, frequency, hesitancy or  incontinence MS: Denies muscle aches, joint pain or swelling Neuro: Denies headache, neurologic deficits (focal weakness, numbness, tingling), abnormal gait Psych: Denies anxiety, depression, SI/HI/AVH Skin: Denies new rashes or lesions ID: Denies sick contacts, exotic exposures, travel  Past Medical History:  Diagnosis Date  . Goiter   . Hypothyroid   . Prediabetes   . Recurrent cold sores     Past Surgical History:  Procedure Laterality Date  . APPENDECTOMY    . KNEE ARTHROSCOPY Left 2008   torn meniscus    SOCIAL HISTORY:  reports that she has never smoked. She has never used smokeless tobacco. She reports current alcohol use. She reports that she does not use drugs.  Allergies  Allergen Reactions  . Aspirin Other (See Comments)  . Penicillin G Other (See Comments)    FAMILY HISTORY: Family History  Problem Relation Age of Onset  . Thyroid disease Mother   . Diabetes Mellitus II Mother   . Thyroid disease Father   . Hypothyroidism Daughter   . Hypothyroidism Son   . Diabetes Mellitus I Son   . Kidney cancer Maternal Grandmother   . Lung cancer Maternal Grandfather        smoker  . Colon cancer Neg Hx   . Breast cancer Neg Hx      Prior to Admission medications   Medication Sig Start Date End Date Taking? Authorizing Provider  hydrocortisone-pramoxine [facility]) rectal foam Place 1 applicator rectally 2 (two) times daily. 01/30/19   Erasmo Downer, MD  Multiple Vitamin (MULTI-VITAMINS) TABS Take by mouth.    [provider]  SYNTHROID 100 MCG tablet TAKE 1 TABLET BY MOUTH EVERY DAY (NEEDS OFFICE  VISIT FOR FUTURE REFILL) 02/04/19   Erasmo Downer, MD    Physical Exam: Vitals:   02/26/19 1420 02/26/19 1421 02/26/19 1620  BP: (!) 144/92  125/77  Pulse: (!) 123  66  Resp: 18  16  Temp: 98.2 F (36.8 C)    TempSrc: Oral    SpO2: 100%  99%  Weight:  70.3 kg   Height:  5\' 7"  (1.702 m)       Constitutional: Mild distress due  to feeling of nausea Eyes: PERRL, lids and conjunctivae normal ENMT: Mucous membranes are moist. Posterior pharynx clear of any exudate or lesions.Normal dentition.  Neck: normal, supple, no masses, no thyromegaly Respiratory: clear to auscultation bilaterally, no wheezing, no crackles. Normal respiratory effort. No accessory muscle use.  Cardiovascular: Regular rate and rhythm, no murmurs / rubs / gallops. No extremity edema. 2+ pedal pulses. No carotid bruits.  Abdomen: no tenderness, no masses palpated. No hepatosplenomegaly. Bowel sounds positive.  Musculoskeletal: no clubbing / cyanosis. No joint deformity upper and lower extremities. Good ROM, no contractures. Normal muscle tone.  Skin: no rashes, lesions, ulcers. No induration Neurologic: CN 2-12 grossly intact. Sensation intact, DTR normal. Strength 5/5 in all 4.  Psychiatric: Normal judgment and insight. Alert and oriented x 3. Normal mood.     Labs on Admission: I have personally reviewed following labs and imaging studies  CBC: Recent Labs  Lab 02/26/19 1431  WBC 7.6  HGB 11.4*  HCT 34.7*  MCV 86.3  PLT 265   Basic Metabolic Panel: Recent Labs  Lab 02/26/19 1431  NA 136  K 3.7  CL 103  CO2 24  GLUCOSE 118*  BUN 21*  CREATININE 0.76  CALCIUM 9.5   GFR: Estimated Creatinine Clearance: 78.2 mL/min (by C-G formula based on SCr of 0.76 mg/dL). Liver Function Tests: Recent Labs  Lab 02/21/19 0814 02/26/19 1431  AST 211* 135*  ALT 278* 204*  ALKPHOS 83 67  BILITOT 0.8 1.2  PROT 6.6 8.0  ALBUMIN 4.0 4.3   Recent Labs  Lab 02/26/19 1431  LIPASE 37   No results for input(s): AMMONIA in the last 168 hours. Coagulation Profile: No results for input(s): INR, PROTIME in the last 168 hours. Cardiac Enzymes: No results for input(s): CKTOTAL, CKMB, CKMBINDEX, TROPONINI in the last 168 hours. BNP (last 3 results) No results for input(s): PROBNP in the last 8760 hours. HbA1C: No results for input(s): HGBA1C  in the last 72 hours. CBG: No results for input(s): GLUCAP in the last 168 hours. Lipid Profile: No results for input(s): CHOL, HDL, LDLCALC, TRIG, CHOLHDL, LDLDIRECT in the last 72 hours. Thyroid Function Tests: No results for input(s): TSH, T4TOTAL, FREET4, T3FREE, THYROIDAB in the last 72 hours. Anemia Panel: No results for input(s): VITAMINB12, FOLATE, FERRITIN, TIBC, IRON, RETICCTPCT in the last 72 hours. Urine analysis:    Component Value Date/Time   COLORURINE YELLOW (A) 02/26/2019 1431   APPEARANCEUR CLEAR (A) 02/26/2019 1431   LABSPEC 1.020 02/26/2019 1431   PHURINE 5.0 02/26/2019 1431   GLUCOSEU NEGATIVE 02/26/2019 1431   HGBUR NEGATIVE 02/26/2019 1431   BILIRUBINUR NEGATIVE 02/26/2019 1431   KETONESUR NEGATIVE 02/26/2019 1431   PROTEINUR NEGATIVE 02/26/2019 1431   NITRITE NEGATIVE 02/26/2019 1431   LEUKOCYTESUR NEGATIVE 02/26/2019 1431   Sepsis Labs: !!!!!!!!!!!!!!!!!!!!!!!!!!!!!!!!!!!!!!!!!!!! @LABRCNTIP (procalcitonin:4,lacticidven:4) )No results found for this or any previous visit (from the past 240 hour(s)).   Radiological Exams on Admission: CT ABDOMEN PELVIS W CONTRAST  Result Date: 02/26/2019 CLINICAL DATA:  Right  upper quadrant pain, no fever, no elevated white blood cell count. EXAM: CT ABDOMEN AND PELVIS WITH CONTRAST TECHNIQUE: Multidetector CT imaging of the abdomen and pelvis was performed using the standard protocol following bolus administration of intravenous contrast. CONTRAST:  118mL OMNIPAQUE IOHEXOL 300 MG/ML  SOLN COMPARISON:  Same day ultrasound, CT abdomen pelvis 05/15/2008 FINDINGS: Lower chest: Atelectatic changes in the lung bases. Lung bases otherwise clear. Normal heart size. No pericardial effusion. Hepatobiliary: No focal liver abnormality is seen. Multiple gallbladder polyps are better visualized on same day ultrasound. No gallstones, gallbladder wall thickening, or biliary dilatation. Pancreas: Unremarkable. No pancreatic ductal dilatation or  surrounding inflammatory changes. Spleen: Normal in size without focal abnormality. Adrenals/Urinary Tract: Normal adrenal glands. Kidneys enhance and excrete symmetrically. No concerning renal lesions. No urolithiasis or hydronephrosis. Urinary bladder is unremarkable. Stomach/Bowel: Mild hazy stranding anterior to the GE junction with few reactive appearing lymph nodes. Minimal if any thickening in the visible mucosa though this is poorly assessed due to underdistention. Slight thickening is present at the gastric antrum as well though could reflect normal contraction. Duodenal sweep is unremarkable. No small bowel dilatation or wall thickening. The appendix is surgically absent. No colonic dilatation or wall thickening. Vascular/Lymphatic: Atherosclerotic plaque within the normal caliber aorta. Reactive adenopathy the adjacent the GE junction/upper abdomen as above with a borderline enlarged node measuring 10 mm (2/18). Reproductive: Retroflexed uterus. No concerning adnexal lesions. Other: No free fluid. No free air. No bowel containing hernias. Musculoskeletal: Multilevel degenerative changes are present in the imaged portions of the spine. No acute osseous abnormality or suspicious osseous lesion. IMPRESSION: 1. Mild hazy stranding anterior to the GE junction/upper abdomen with few reactive appearing lymph nodes. Minimal if any thickening in the visible mucosa though this is poorly assessed due to underdistention. Slight thickening is present at the gastric antrum as well though this is poorly assessed due to underdistention. In the setting upper abdominal pain, could reflect esophagitis or gastritis. Recommend correlation with upper endoscopy. 2. Multiple gallbladder polyps are better visualized on same day ultrasound. 3.  Aortic Atherosclerosis (ICD10-I70.0). 4. Retroflexed uterus. Electronically Signed   By: Lovena Le M.D.   On: 02/26/2019 16:06   US Abdomen Limited RUQ  Result Date:  02/26/2019 CLINICAL DATA:  Nausea.  Elevated liver enzymes EXAM: ULTRASOUND ABDOMEN LIMITED RIGHT UPPER QUADRANT COMPARISON:  CT abdomen and pelvis May 15, 2008 FINDINGS: Gallbladder: No within the gallbladder, there are multiple echogenic foci which neither move nor shadow consistent with cholelithiasis. There are two polyps, each measuring 6 mm in length. A 5 mm polyp is also noted. Other polyps seen measure 2 mm and 3 mm respectively. A questionable 6 polyp is present measuring 2 mm. There are no echogenic foci in the gallbladder which move and shadow as is expected with gallstones. No gallbladder wall thickening or pericholecystic fluid. No sonographic Murphy sign noted by sonographer. Common bile duct: Diameter: 3 mm. No intrahepatic or extrahepatic biliary duct dilatation. Liver: No focal lesion identified. Within normal limits in parenchymal echogenicity. Portal vein is patent on color Doppler imaging with normal direction of blood flow towards the liver. Other: None. IMPRESSION: Multiple apparent polyps in the gallbladder, largest measuring 6 mm in length. Per consensus guidelines, polyps in the size range present on this study do not warrant additional imaging surveillance. No evident gallstones, gallbladder wall thickening, or pericholecystic fluid. Study otherwise unremarkable. Electronically Signed   By: Lowella Grip III M.D.   On: 02/26/2019 15:20  All images have been reviewed by me personally.    Assessment/Plan Principal Problem:   Intractable nausea and vomiting Active Problems:   Hypothyroid   External hemorrhoid, bleeding   Transaminitis   Gallbladder polyp   Esophagitis    Intractable nausea and vomiting, multifactorial Esophagitis with lymphadenopathy, reactive? Gallbladder polyps -Admit patient to observation.  Start IV fluids -Antiemetics, clear liquid diet -Gastroenterology and general surgery consulted-awaiting input -CT scan reviewed.  Start patient on PPI  IV twice daily, Carafate 3 times daily with meals -Suspect patient would benefit from endoscopic evaluation along with possible removal of gallbladder with pathology/biopsy -Supportive care.  Transaminitis -Nonspecific.  No history of tattoos, IV drug abuse. -Check hepatitis panel.  History of hypothyroidism -Continue Synthroid.  DVT prophylaxis: Subcutaneous heparin Code Status: Full code Family Communication: None at bedside Disposition Plan: To be determined Consults called: General surgery, gastroenterology Admission status: Admit for observation for now.   Time Spent: 65 minutes.  >50% of the time was devoted to discussing the patients care, assessment, plan and disposition with other care givers along with counseling the patient about the risks and benefits of treatment.    Treyshon Buchanon Joline Maxcy MD [facility]  If 7PM-7AM, please contact night-coverage   02/26/2019, 5:32 PM

## 2019-02-26 NOTE — ED Notes (Signed)
Patient transported to CT 

## 2019-02-26 NOTE — Progress Notes (Signed)
Patient advised as below.  

## 2019-02-26 NOTE — Consult Note (Signed)
Date of Consultation:  02/26/2019  Requesting Physician:  Lannette Donath, MD  Reason for Consultation:  Gallbladder polyps  History of Present Illness: Annette Phillips is a 54 y.o. female presenting today to the ED with a 1-week history of nausea, as well as 1-day history of epigastric abdominal pain.  She recently changed PCPs and got established with Dr. Beryle Flock.  She had routine labs on 01/30/2019 including CBC and CMP and was found to have elevated AST and ALT of 177 and 299 respectively.  These labs were rechecked on 02/21/2019 and her AST was 211 and her ALT was 278.  In light of that a referral to gastroenterology was made as well's orders been placed for outpatient ultrasound and lipase.  However the patient then started having issues with nausea and then of course today started having abdominal pain.  She presented to her PCP again and her pain was getting to be too much and was told to go to the emergency room instead for further evaluation.  In the emergency room, her CBC shows a white blood cell count of 7.6, her LFTs show a total bilirubin of 1.2, AST of 135, ALT of 204, alkaline phosphatase of 67 and lipase 37.  She had ultrasound of the right upper quadrant which showed a gallbladder with 5, possibly 6, small polyps with the largest dimension of 6 mm.  There is no gallbladder wall thickening or pericholecystic fluid.  CT scan was then performed which again confirmed some of the polyps in the gallbladder I also showed thickening of the stomach wall particularly in the antrum.  I have independently viewed these imaging studies and agree with the findings.  Past Medical History: Past Medical History:  Diagnosis Date  . Goiter   . Hypothyroid   . Prediabetes   . Recurrent cold sores      Past Surgical History: Past Surgical History:  Procedure Laterality Date  . APPENDECTOMY    . KNEE ARTHROSCOPY Left 2008   torn meniscus    Home Medications: Prior to Admission medications    Medication Sig Start Date End Date Taking? Authorizing Provider  Multiple Vitamin (MULTI-VITAMINS) TABS Take 1 tablet by mouth daily.    Yes [provider]  SYNTHROID 100 MCG tablet TAKE 1 TABLET BY MOUTH EVERY DAY (NEEDS OFFICE VISIT FOR FUTURE REFILL) Patient taking differently: Take 100 mcg by mouth every evening.  02/04/19  Yes Bacigalupo, Marzella Schlein, MD  hydrocortisone-pramoxine [facility]) rectal foam Place 1 applicator rectally 2 (two) times daily. Patient not taking: Reported on 02/26/2019 01/30/19   Erasmo Downer, MD    Allergies: Allergies  Allergen Reactions  . Aspirin Other (See Comments)  . Penicillin G Other (See Comments)    Social History:  reports that she has never smoked. She has never used smokeless tobacco. She reports current alcohol use. She reports that she does not use drugs.   Family History: Family History  Problem Relation Age of Onset  . Thyroid disease Mother   . Diabetes Mellitus II Mother   . Thyroid disease Father   . Hypothyroidism Daughter   . Hypothyroidism Son   . Diabetes Mellitus I Son   . Kidney cancer Maternal Grandmother   . Lung cancer Maternal Grandfather        smoker  . Colon cancer Neg Hx   . Breast cancer Neg Hx     Review of Systems: Review of Systems  Constitutional: Negative for chills and fever.  HENT: Negative for hearing  loss.   Eyes: Negative for blurred vision.  Respiratory: Negative for shortness of breath.   Cardiovascular: Negative for chest pain.  Gastrointestinal: Positive for abdominal pain and nausea. Negative for blood in stool, constipation, diarrhea and vomiting.  Genitourinary: Negative for dysuria.  Musculoskeletal: Negative for myalgias.  Skin: Negative for rash.  Neurological: Negative for dizziness.  Psychiatric/Behavioral: Negative for depression.    Physical Exam BP 125/77 (BP Location: Right Arm)   Pulse 66   Temp 98.2 F (36.8 C) (Oral)   Resp 16   Ht 5\' 7"  (1.702 m)    Wt 70.3 kg   SpO2 99%   BMI 24.28 kg/m  CONSTITUTIONAL: No acute distress HEENT:  Normocephalic, atraumatic, extraocular motion intact. NECK: Trachea is midline, and there is no jugular venous distension. RESPIRATORY:  Lungs are clear, and breath sounds are equal bilaterally. Normal respiratory effort without pathologic use of accessory muscles. CARDIOVASCULAR: Heart is regular without murmurs, gallops, or rubs. GI: The abdomen is soft, nondistended, with some tenderness to palpation in the epigastric area as well in the right upper quadrant.  No peritonitis.  Scars from previous laparoscopic appendectomy are very faint and well-healed.   MUSCULOSKELETAL:  Normal muscle strength and tone in all four extremities.  No peripheral edema or cyanosis. SKIN: Skin turgor is normal. There are no pathologic skin lesions.  NEUROLOGIC:  Motor and sensation is grossly normal.  Cranial nerves are grossly intact. PSYCH:  Alert and oriented to person, place and time. Affect is normal.  Laboratory Analysis: Results for orders placed or performed during the hospital encounter of 02/26/19 (from the past 24 hour(s))  Lipase, blood     Status: None   Collection Time: 02/26/19  2:31 PM  Result Value Ref Range   Lipase 37 11 - 51 U/L  Comprehensive metabolic panel     Status: Abnormal   Collection Time: 02/26/19  2:31 PM  Result Value Ref Range   Sodium 136 135 - 145 mmol/L   Potassium 3.7 3.5 - 5.1 mmol/L   Chloride 103 98 - 111 mmol/L   CO2 24 22 - 32 mmol/L   Glucose, Bld 118 (H) 70 - 99 mg/dL   BUN 21 (H) 6 - 20 mg/dL   Creatinine, Ser 04/26/19 0.44 - 1.00 mg/dL   Calcium 9.5 8.9 - 1.74 mg/dL   Total Protein 8.0 6.5 - 8.1 g/dL   Albumin 4.3 3.5 - 5.0 g/dL   AST 08.1 (H) 15 - 41 U/L   ALT 204 (H) 0 - 44 U/L   Alkaline Phosphatase 67 38 - 126 U/L   Total Bilirubin 1.2 0.3 - 1.2 mg/dL   GFR calc non Af Amer >60 >60 mL/min   GFR calc Af Amer >60 >60 mL/min   Anion gap 9 5 - 15  CBC     Status:  Abnormal   Collection Time: 02/26/19  2:31 PM  Result Value Ref Range   WBC 7.6 4.0 - 10.5 K/uL   RBC 4.02 3.87 - 5.11 MIL/uL   Hemoglobin 11.4 (L) 12.0 - 15.0 g/dL   HCT 04/26/19 (L) 18.5 - 63.1 %   MCV 86.3 80.0 - 100.0 fL   MCH 28.4 26.0 - 34.0 pg   MCHC 32.9 30.0 - 36.0 g/dL   RDW 49.7 02.6 - 37.8 %   Platelets 265 150 - 400 K/uL   nRBC 0.0 0.0 - 0.2 %  Urinalysis, Complete w Microscopic     Status: Abnormal   Collection Time: 02/26/19  2:31 PM  Result Value Ref Range   Color, Urine YELLOW (A) YELLOW   APPearance CLEAR (A) CLEAR   Specific Gravity, Urine 1.020 1.005 - 1.030   pH 5.0 5.0 - 8.0   Glucose, UA NEGATIVE NEGATIVE mg/dL   Hgb urine dipstick NEGATIVE NEGATIVE   Bilirubin Urine NEGATIVE NEGATIVE   Ketones, ur NEGATIVE NEGATIVE mg/dL   Protein, ur NEGATIVE NEGATIVE mg/dL   Nitrite NEGATIVE NEGATIVE   Leukocytes,Ua NEGATIVE NEGATIVE   RBC / HPF 0-5 0 - 5 RBC/hpf   WBC, UA 0-5 0 - 5 WBC/hpf   Bacteria, UA RARE (A) NONE SEEN   Squamous Epithelial / LPF 0-5 0 - 5   Mucus PRESENT   TSH     Status: Abnormal   Collection Time: 02/26/19  2:31 PM  Result Value Ref Range   TSH 0.027 (L) 0.350 - 4.500 uIU/mL    Imaging: CT ABDOMEN PELVIS W CONTRAST  Result Date: 02/26/2019 CLINICAL DATA:  Right upper quadrant pain, no fever, no elevated white blood cell count. EXAM: CT ABDOMEN AND PELVIS WITH CONTRAST TECHNIQUE: Multidetector CT imaging of the abdomen and pelvis was performed using the standard protocol following bolus administration of intravenous contrast. CONTRAST:  OMNIPAQUE IOHEXOL 300 MG/ML  SOLN COMPARISON:  Same day ultrasound, CT abdomen pelvis 05/15/2008 FINDINGS: Lower chest: Atelectatic changes in the lung bases. Lung bases otherwise clear. Normal heart size. No pericardial effusion. Hepatobiliary: No focal liver abnormality is seen. Multiple gallbladder polyps are better visualized on same day ultrasound. No gallstones, gallbladder wall thickening, or biliary  dilatation. Pancreas: Unremarkable. No pancreatic ductal dilatation or surrounding inflammatory changes. Spleen: Normal in size without focal abnormality. Adrenals/Urinary Tract: Normal adrenal glands. Kidneys enhance and excrete symmetrically. No concerning renal lesions. No urolithiasis or hydronephrosis. Urinary bladder is unremarkable. Stomach/Bowel: Mild hazy stranding anterior to the GE junction with few reactive appearing lymph nodes. Minimal if any thickening in the visible mucosa though this is poorly assessed due to underdistention. Slight thickening is present at the gastric antrum as well though could reflect normal contraction. Duodenal sweep is unremarkable. No small bowel dilatation or wall thickening. The appendix is surgically absent. No colonic dilatation or wall thickening. Vascular/Lymphatic: Atherosclerotic plaque within the normal caliber aorta. Reactive adenopathy the adjacent the GE junction/upper abdomen as above with a borderline enlarged node measuring 10 mm (2/18). Reproductive: Retroflexed uterus. No concerning adnexal lesions. Other: No free fluid. No free air. No bowel containing hernias. Musculoskeletal: Multilevel degenerative changes are present in the imaged portions of the spine. No acute osseous abnormality or suspicious osseous lesion. IMPRESSION: 1. Mild hazy stranding anterior to the GE junction/upper abdomen with few reactive appearing lymph nodes. Minimal if any thickening in the visible mucosa though this is poorly assessed due to underdistention. Slight thickening is present at the gastric antrum as well though this is poorly assessed due to underdistention. In the setting upper abdominal pain, could reflect esophagitis or gastritis. Recommend correlation with upper endoscopy. 2. Multiple gallbladder polyps are better visualized on same day ultrasound. 3.  Aortic Atherosclerosis (ICD10-I70.0). 4. Retroflexed uterus. Electronically Signed   By: Kreg Shropshire M.D.   On:  02/26/2019 16:06   US Abdomen Limited RUQ  Result Date: 02/26/2019 CLINICAL DATA:  Nausea.  Elevated liver enzymes EXAM: ULTRASOUND ABDOMEN LIMITED RIGHT UPPER QUADRANT COMPARISON:  CT abdomen and pelvis May 15, 2008 FINDINGS: Gallbladder: No within the gallbladder, there are multiple echogenic foci which neither move nor shadow consistent with cholelithiasis. There are  two polyps, each measuring 6 mm in length. A 5 mm polyp is also noted. Other polyps seen measure 2 mm and 3 mm respectively. A questionable 6 polyp is present measuring 2 mm. There are no echogenic foci in the gallbladder which move and shadow as is expected with gallstones. No gallbladder wall thickening or pericholecystic fluid. No sonographic Murphy sign noted by sonographer. Common bile duct: Diameter: 3 mm. No intrahepatic or extrahepatic biliary duct dilatation. Liver: No focal lesion identified. Within normal limits in parenchymal echogenicity. Portal vein is patent on color Doppler imaging with normal direction of blood flow towards the liver. Other: None. IMPRESSION: Multiple apparent polyps in the gallbladder, largest measuring 6 mm in length. Per consensus guidelines, polyps in the size range present on this study do not warrant additional imaging surveillance. No evident gallstones, gallbladder wall thickening, or pericholecystic fluid. Study otherwise unremarkable. Electronically Signed   By: Lowella Grip III M.D.   On: 02/26/2019 15:20    Assessment and Plan: This is a 54 y.o. female with a 1 week history of nausea after p.o. intake as well as 1 day history of epigastric pain.  -Discussed with the patient is some of her symptoms do potentially correlate with biliary etiology although she must that her pain is in the epigastric area but on exam she also has some pain in the right upper quadrant area.  Denies any prior history of ulcers or acid reflux issues.  However the polyps in her ultrasound are very small and did not  occupy a significant volume of the gallbladder itself and did not appear to be near the neck of the gallbladder to be acting type of biliary colic or obstruction episode.  There is no pericholecystic fluid or gallbladder wall thickening either.  The AST and ALT are also elevated and these have been elevated since at least December when the patient had no abdominal symptoms at the time.  I am not sure how this may be playing a role in her current symptoms.  At this point the patient is being admitted to the medical team for further work-up.  Dr. Marius Ditch with gastroenterology will also be evaluating the patient is tentatively scheduled for an EGD for tomorrow to evaluate for the stomach thickening.  Hepatitis studies have also been ordered given the elevated AST and ALT.  I discussed with the patient that if her work-up is otherwise negative, then the other more likely reason would be the gallbladder to explain her symptoms, and as such we would proceed with cholecystectomy.  Okay to give her clear liquids today and make her n.p.o. after midnight.  No antibiotics are needed at this point as there is no evidence of cholecystitis.  Patient agrees with this plan and all of her questions have been answered.  Face-to-face time spent with the patient and care providers was 80 minutes, with more than 50% of the time spent counseling, educating, and coordinating care of the patient.     Melvyn Neth, MD [facility] Pg:  [phone]

## 2019-02-26 NOTE — ED Notes (Signed)
Admitting MD at bedside.

## 2019-02-26 NOTE — ED Notes (Signed)
Pt in US

## 2019-02-26 NOTE — ED Provider Notes (Signed)
[facility] Emergency Department Provider Note  ____________________________________________   First MD Initiated Contact with Patient 02/26/19 1507     (approximate)  I have reviewed the triage vital signs and the nursing notes.   HISTORY  Chief Complaint Abdominal Pain    HPI Annette Phillips is a 54 y.o. female below list of previous medical conditions presents to the emergency department secondary to 3-day history of nausea with upper abdominal discomfort.  She was seen by primary care provider at [facility] family practice and referred to the emergency department.  Patient admits to very poor p.o. intake secondary to pain and vomiting.  Patient states that she vomits even just with eating crackers and Jell-O.  Patient states that current pain score is 10 out of 10.        Past Medical History:  Diagnosis Date  . Goiter   . Hypothyroid   . Prediabetes   . Recurrent cold sores     Patient Active Problem List   Diagnosis Date Noted  . Intractable nausea and vomiting 02/26/2019  . Transaminitis 02/26/2019  . Gallbladder polyp 02/26/2019  . Esophagitis 02/26/2019  . External hemorrhoid, bleeding 01/30/2019  . Family history of diabetes mellitus 01/30/2019  . Hypothyroid     Past Surgical History:  Procedure Laterality Date  . APPENDECTOMY    . KNEE ARTHROSCOPY Left 2008   torn meniscus    Prior to Admission medications   Medication Sig Start Date End Date Taking? Authorizing Provider  Multiple Vitamin (MULTI-VITAMINS) TABS Take 1 tablet by mouth daily.    Yes [provider]  SYNTHROID 100 MCG tablet TAKE 1 TABLET BY MOUTH EVERY DAY (NEEDS OFFICE VISIT FOR FUTURE REFILL) Patient taking differently: Take 100 mcg by mouth every evening.  02/04/19  Yes Bacigalupo, Marzella Schlein, MD  hydrocortisone-pramoxine [facility]) rectal foam Place 1 applicator rectally 2 (two) times daily. Patient not taking: Reported on 02/26/2019 01/30/19    Erasmo Downer, MD    Allergies Aspirin and Penicillin g  Family History  Problem Relation Age of Onset  . Thyroid disease Mother   . Diabetes Mellitus II Mother   . Thyroid disease Father   . Hypothyroidism Daughter   . Hypothyroidism Son   . Diabetes Mellitus I Son   . Kidney cancer Maternal Grandmother   . Lung cancer Maternal Grandfather        smoker  . Colon cancer Neg Hx   . Breast cancer Neg Hx     Social History Social History   Tobacco Use  . Smoking status: Never Smoker  . Smokeless tobacco: Never Used  Substance Use Topics  . Alcohol use: Yes    Alcohol/week: 0.0 standard drinks    Comment: rare  . Drug use: Never    Review of Systems Constitutional: No fever/chills Eyes: No visual changes. ENT: No sore throat. Cardiovascular: Denies chest pain. Respiratory: Denies shortness of breath. Gastrointestinal: Positive for abdominal pain No diarrhea.  No constipation. Genitourinary: Negative for dysuria. Musculoskeletal: Negative for neck pain.  Negative for back pain. Integumentary: Negative for rash. Neurological: Negative for headaches, focal weakness or numbness.  ____________________________________________   PHYSICAL EXAM:  VITAL SIGNS: ED Triage Vitals  Enc Vitals Group     BP 02/26/19 1420 (!) 144/92     Pulse Rate 02/26/19 1420 (!) 123     Resp 02/26/19 1420 18     Temp 02/26/19 1420 98.2 F (36.8 C)     Temp Source 02/26/19 1420  Oral     SpO2 02/26/19 1420 100 %     Weight 02/26/19 1421 70.3 kg (155 lb)     Height 02/26/19 1421 1.702 m (5\' 7" )     Head Circumference --      Peak Flow --      Pain Score 02/26/19 1420 8     Pain Loc --      Pain Edu? --      Excl. in GC? --     Constitutional: Alert and oriented.  Eyes: Conjunctivae are normal.  Head: Atraumatic. Mouth/Throat: Patient is wearing a mask. Neck: No stridor.  No meningeal signs.   Cardiovascular: Normal rate, regular rhythm. Good peripheral circulation.  Grossly normal heart sounds. Respiratory: Normal respiratory effort.  No retractions. Gastrointestinal: Soft and nontender. No distention.  Musculoskeletal: No lower extremity tenderness nor edema. No gross deformities of extremities. Neurologic:  Normal speech and language. No gross focal neurologic deficits are appreciated.  Skin:  Skin is warm, dry and intact. Psychiatric: Mood and affect are normal. Speech and behavior are normal.  ____________________________________________   LABS (all labs ordered are listed, but only abnormal results are displayed)  Labs Reviewed  COMPREHENSIVE METABOLIC PANEL - Abnormal; Notable for the following components:      Result Value   Glucose, Bld 118 (*)    BUN 21 (*)    AST 135 (*)    ALT 204 (*)    All other components within normal limits  CBC - Abnormal; Notable for the following components:   Hemoglobin 11.4 (*)    HCT 34.7 (*)    All other components within normal limits  URINALYSIS, COMPLETE (UACMP) WITH MICROSCOPIC - Abnormal; Notable for the following components:   Color, Urine YELLOW (*)    APPearance CLEAR (*)    Bacteria, UA RARE (*)    All other components within normal limits  LIPASE, BLOOD  HEPATITIS PANEL, ACUTE  HIV ANTIBODY (ROUTINE TESTING W REFLEX)  COMPREHENSIVE METABOLIC PANEL  CBC  PROTIME-INR  APTT  TSH  BILIRUBIN, DIRECT   ____________________________________________  EKG  ED ECG REPORT I, Colville N Larita Deremer, the attending physician, personally viewed and interpreted this ECG.   Date: 02/26/2019  EKG Time: 2:25 PM  Rate: 118  Rhythm: Sinus tachycardia  Axis: Normal  Intervals: Normal  ST&T Change: None  ____________________________________________  RADIOLOGY I, Marmaduke N Vraj Denardo, personally viewed and evaluated these images (plain radiographs) as part of my medical decision making, as well as reviewing the written report by the radiologist.  ED MD interpretation: Hazy stranding anterior GE/upper  abdomen with few reactive nodes esophagitis versus gastritis on CT per radiologist.  Ultrasound revealed multiple gallbladder polyps no stone or evidence of choledocholithiasis.  Official radiology report(s): CT ABDOMEN PELVIS W CONTRAST  Result Date: 02/26/2019 CLINICAL DATA:  Right upper quadrant pain, no fever, no elevated white blood cell count. EXAM: CT ABDOMEN AND PELVIS WITH CONTRAST TECHNIQUE: Multidetector CT imaging of the abdomen and pelvis was performed using the standard protocol following bolus administration of intravenous contrast. CONTRAST:  04/26/2019 OMNIPAQUE IOHEXOL 300 MG/ML  SOLN COMPARISON:  Same day ultrasound, CT abdomen pelvis 05/15/2008 FINDINGS: Lower chest: Atelectatic changes in the lung bases. Lung bases otherwise clear. Normal heart size. No pericardial effusion. Hepatobiliary: No focal liver abnormality is seen. Multiple gallbladder polyps are better visualized on same day ultrasound. No gallstones, gallbladder wall thickening, or biliary dilatation. Pancreas: Unremarkable. No pancreatic ductal dilatation or surrounding inflammatory changes. Spleen: Normal in size without  focal abnormality. Adrenals/Urinary Tract: Normal adrenal glands. Kidneys enhance and excrete symmetrically. No concerning renal lesions. No urolithiasis or hydronephrosis. Urinary bladder is unremarkable. Stomach/Bowel: Mild hazy stranding anterior to the GE junction with few reactive appearing lymph nodes. Minimal if any thickening in the visible mucosa though this is poorly assessed due to underdistention. Slight thickening is present at the gastric antrum as well though could reflect normal contraction. Duodenal sweep is unremarkable. No small bowel dilatation or wall thickening. The appendix is surgically absent. No colonic dilatation or wall thickening. Vascular/Lymphatic: Atherosclerotic plaque within the normal caliber aorta. Reactive adenopathy the adjacent the GE junction/upper abdomen as above with a  borderline enlarged node measuring 10 mm (2/18). Reproductive: Retroflexed uterus. No concerning adnexal lesions. Other: No free fluid. No free air. No bowel containing hernias. Musculoskeletal: Multilevel degenerative changes are present in the imaged portions of the spine. No acute osseous abnormality or suspicious osseous lesion. IMPRESSION: 1. Mild hazy stranding anterior to the GE junction/upper abdomen with few reactive appearing lymph nodes. Minimal if any thickening in the visible mucosa though this is poorly assessed due to underdistention. Slight thickening is present at the gastric antrum as well though this is poorly assessed due to underdistention. In the setting upper abdominal pain, could reflect esophagitis or gastritis. Recommend correlation with upper endoscopy. 2. Multiple gallbladder polyps are better visualized on same day ultrasound. 3.  Aortic Atherosclerosis (ICD10-I70.0). 4. Retroflexed uterus. Electronically Signed   By: Kreg Shropshire M.D.   On: 02/26/2019 16:06   US Abdomen Limited RUQ  Result Date: 02/26/2019 CLINICAL DATA:  Nausea.  Elevated liver enzymes EXAM: ULTRASOUND ABDOMEN LIMITED RIGHT UPPER QUADRANT COMPARISON:  CT abdomen and pelvis May 15, 2008 FINDINGS: Gallbladder: No within the gallbladder, there are multiple echogenic foci which neither move nor shadow consistent with cholelithiasis. There are two polyps, each measuring 6 mm in length. A 5 mm polyp is also noted. Other polyps seen measure 2 mm and 3 mm respectively. A questionable 6 polyp is present measuring 2 mm. There are no echogenic foci in the gallbladder which move and shadow as is expected with gallstones. No gallbladder wall thickening or pericholecystic fluid. No sonographic Murphy sign noted by sonographer. Common bile duct: Diameter: 3 mm. No intrahepatic or extrahepatic biliary duct dilatation. Liver: No focal lesion identified. Within normal limits in parenchymal echogenicity. Portal vein is patent on  color Doppler imaging with normal direction of blood flow towards the liver. Other: None. IMPRESSION: Multiple apparent polyps in the gallbladder, largest measuring 6 mm in length. Per consensus guidelines, polyps in the size range present on this study do not warrant additional imaging surveillance. No evident gallstones, gallbladder wall thickening, or pericholecystic fluid. Study otherwise unremarkable. Electronically Signed   By: Bretta Bang III M.D.   On: 02/26/2019 15:20    ____________________________________________   Procedures   ____________________________________________   INITIAL IMPRESSION / MDM / ASSESSMENT AND PLAN / ED COURSE  As part of my medical decision making, I reviewed the following data within the electronic MEDICAL RECORD NUMBER   54 year old female presented with above-stated history and physical exam secondary to upper abdominal discomfort with differential diagnosis including but not limited to choledocholithiasis cholelithiasis gastritis esophagitis gastric CA or cholangio-CA.  Ultrasound performed which revealed multiple gallbladder polyps however no stone or evidence of cholecystitis.  CT scan of the abdomen pelvis performed which revealed stranding in the GE junction and antrum.  Given patient's pain uncontrolled pain despite morphine and inability to tolerate p.o.  Patient nausea also unrelieved with IV Zofran patient discussed with Dr. Marius Ditch gastroenterologist who recommended hospital admission for possible cholecystectomy and endoscopy.  Patient subsequently discussed with Dr. Reesa Chew  ____________________________________________  FINAL CLINICAL IMPRESSION(S) / ED DIAGNOSES  Final diagnoses:  Intractable nausea and vomiting     MEDICATIONS GIVEN DURING THIS VISIT:  Medications  sodium chloride flush (NS) 0.9 % injection 3 mL (3 mLs Intravenous Not Given 02/26/19 1513)  fentaNYL (SUBLIMAZE) injection 50 mcg (50 mcg Intravenous Given 02/26/19 1441)    levothyroxine (SYNTHROID) tablet 100 mcg (has no administration in time range)  heparin injection 5,000 Units (has no administration in time range)  0.9 %  sodium chloride infusion (has no administration in time range)  acetaminophen (TYLENOL) tablet 650 mg (has no administration in time range)    Or  acetaminophen (TYLENOL) suppository 650 mg (has no administration in time range)  senna-docusate (Senokot-S) tablet 1 tablet (has no administration in time range)  ondansetron (ZOFRAN) tablet 4 mg (has no administration in time range)    Or  ondansetron (ZOFRAN) injection 4 mg (has no administration in time range)  pantoprazole (PROTONIX) injection 40 mg (has no administration in time range)  sucralfate (CARAFATE) 1 GM/10ML suspension 1 g (has no administration in time range)  sodium chloride 0.9 % bolus 1,000 mL (0 mLs Intravenous Stopped 02/26/19 1652)  ondansetron (ZOFRAN) injection 4 mg (4 mg Intravenous Given 02/26/19 1441)  morphine 2 MG/ML injection 2 mg (2 mg Intravenous Given 02/26/19 1602)  iohexol (OMNIPAQUE) 300 MG/ML solution 100 mL (100 mLs Intravenous Contrast Given 02/26/19 1545)  sucralfate (CARAFATE) tablet 1 g (1 g Oral Given 02/26/19 1653)  alum & mag hydroxide-simeth (MAALOX/MYLANTA) 200-200-20 MG/5ML suspension 30 mL (30 mLs Oral Given 02/26/19 1653)    And  lidocaine (XYLOCAINE) 2 % viscous mouth solution 15 mL (15 mLs Oral Given 02/26/19 1653)  ondansetron (ZOFRAN) injection 4 mg (4 mg Intravenous Given 02/26/19 1701)  sodium chloride 0.9 % bolus 1,000 mL (0 mLs Intravenous Stopped 02/26/19 1730)     ED Discharge Orders    None      *Please note:  Annette Phillips was evaluated in Emergency Department on 02/26/2019 for the symptoms described in the history of present illness. She was evaluated in the context of the global COVID-19 pandemic, which necessitated consideration that the patient might be at risk for infection with the SARS-CoV-2 virus that causes COVID-19.  Institutional protocols and algorithms that pertain to the evaluation of patients at risk for COVID-19 are in a state of rapid change based on information released by regulatory bodies including the CDC and federal and state organizations. These policies and algorithms were followed during the patient's care in the ED.  Some ED evaluations and interventions may be delayed as a result of limited staffing during the pandemic.*  Note:  This document was prepared using Dragon voice recognition software and may include unintentional dictation errors.   Gregor Hams, MD 02/26/19 1806

## 2019-02-26 NOTE — Telephone Encounter (Signed)
Pt called in c/o severe abd pain in the upper center of her abd going up under her ribcage.    I've had my appendix out so I know it's not that.     See triage notes.  I called into Dr. Penelope Coop office and spoke with Michelle Nasuti since the pt spoke with their nurse yesterday.   She is for an U/S but it needs to be scheduled. Blood work was done in regards to this pain.     After discussing it with Michelle Nasuti she was referred on to the ED.   They can do the U/S there and give her pain medication IV and give her IV fluids since she is so nauseas.  Pt was agreeable to going to the ED and is going to [facility].   She's calling her daughter to take her now.  I sent my notes to the office.  Reason for Disposition . [1] SEVERE pain (e.g., excruciating) AND [2] present > 1 hour  Answer Assessment - Initial Assessment Questions 1. LOCATION: "Where does it hurt?"      I'm having nausea after eating.   Hurting in the middle above the naval under my ribcage 2. RADIATION: "Does the pain shoot anywhere else?" (e.g., chest, back)     No 3. ONSET: "When did the pain begin?" (e.g., minutes, hours or days ago)      Off and on 2 weeks but last 2 days it's been bad. 4. SUDDEN: "Gradual or sudden onset?"     Pain would hit then go away after 15 minutes.   Last 2 days has been constant. 5. PATTERN "Does the pain come and go, or is it constant?"    - If constant: "Is it getting better, staying the same, or worsening?"      (Note: Constant means the pain never goes away completely; most serious pain is constant and it progresses)     - If intermittent: "How long does it last?" "Do you have pain now?"     (Note: Intermittent means the pain goes away completely between bouts)     Comes and goes but 2 days it's been bad  Does not matter what I eat I get nausea.   No vomiting. 6. SEVERITY: "How bad is the pain?"  (e.g., Scale 1-10; mild, moderate, or severe)   - MILD (1-3): doesn't interfere with  normal activities, abdomen soft and not tender to touch    - MODERATE (4-7): interferes with normal activities or awakens from sleep, tender to touch    - SEVERE (8-10): excruciating pain, doubled over, unable to do any normal activities      The pain is worse today than yesterday.   I talked with the nurse yesterday they are going to schedule me for an U/S. 7. RECURRENT SYMPTOM: "Have you ever had this type of abdominal pain before?" If so, ask: "When was the last time?" and "What happened that time?"      When I had my appendix out. 8. CAUSE: "What do you think is causing the abdominal pain?"     Maybe gallbladder. 9. RELIEVING/AGGRAVATING FACTORS: "What makes it better or worse?" (e.g., movement, antacids, bowel movement)     Eating makes me nauseas.   Not tried any pain medications.    I had blood work and it showed my liver enzymes were very high. 10. OTHER SYMPTOMS: "Has there been any vomiting, diarrhea, constipation, or urine problems?"  nausea 11. PREGNANCY: "Is there any chance you are pregnant?" "When was your last menstrual period?"       N/A  Protocols used: ABDOMINAL PAIN - [facility]

## 2019-02-26 NOTE — ED Notes (Signed)
Patient assisted to restroom without difficulty.

## 2019-02-26 NOTE — Progress Notes (Signed)
Orders signed. Please leave lab slip up front and notify patient to come and have it drawn.

## 2019-02-26 NOTE — ED Triage Notes (Signed)
Pt c/o upper abd pain for the past 2-3 days with nausea. Pt states she was seen at her PCP at [place] [facility] today and had labs drawn.

## 2019-02-27 ENCOUNTER — Inpatient Hospital Stay: Payer: BC Managed Care – PPO | Admitting: Certified Registered Nurse Anesthetist

## 2019-02-27 ENCOUNTER — Encounter
Admission: EM | Disposition: A | Discharge status: Home / Self Care | Payer: Self-pay | Source: Ambulatory Visit | Attending: Internal Medicine

## 2019-02-27 ENCOUNTER — Encounter: Payer: Self-pay | Admitting: Internal Medicine

## 2019-02-27 DIAGNOSIS — R1013 Epigastric pain: Secondary | ICD-10-CM | POA: Diagnosis not present

## 2019-02-27 DIAGNOSIS — K449 Diaphragmatic hernia without obstruction or gangrene: Secondary | ICD-10-CM | POA: Diagnosis present

## 2019-02-27 DIAGNOSIS — Z79890 Hormone replacement therapy (postmenopausal): Secondary | ICD-10-CM | POA: Diagnosis not present

## 2019-02-27 DIAGNOSIS — E039 Hypothyroidism, unspecified: Secondary | ICD-10-CM | POA: Diagnosis present

## 2019-02-27 DIAGNOSIS — R7989 Other specified abnormal findings of blood chemistry: Secondary | ICD-10-CM | POA: Diagnosis not present

## 2019-02-27 DIAGNOSIS — R112 Nausea with vomiting, unspecified: Secondary | ICD-10-CM | POA: Diagnosis present

## 2019-02-27 DIAGNOSIS — R591 Generalized enlarged lymph nodes: Secondary | ICD-10-CM | POA: Diagnosis present

## 2019-02-27 DIAGNOSIS — R7401 Elevation of levels of liver transaminase levels: Secondary | ICD-10-CM | POA: Diagnosis present

## 2019-02-27 DIAGNOSIS — K660 Peritoneal adhesions (postprocedural) (postinfection): Secondary | ICD-10-CM | POA: Diagnosis present

## 2019-02-27 DIAGNOSIS — Z833 Family history of diabetes mellitus: Secondary | ICD-10-CM | POA: Diagnosis not present

## 2019-02-27 DIAGNOSIS — K209 Esophagitis, unspecified without bleeding: Secondary | ICD-10-CM | POA: Diagnosis present

## 2019-02-27 DIAGNOSIS — Z20822 Contact with and (suspected) exposure to covid-19: Secondary | ICD-10-CM | POA: Diagnosis present

## 2019-02-27 DIAGNOSIS — Z8349 Family history of other endocrine, nutritional and metabolic diseases: Secondary | ICD-10-CM | POA: Diagnosis not present

## 2019-02-27 DIAGNOSIS — Z886 Allergy status to analgesic agent status: Secondary | ICD-10-CM | POA: Diagnosis not present

## 2019-02-27 DIAGNOSIS — K824 Cholesterolosis of gallbladder: Secondary | ICD-10-CM | POA: Diagnosis present

## 2019-02-27 DIAGNOSIS — D649 Anemia, unspecified: Secondary | ICD-10-CM | POA: Diagnosis present

## 2019-02-27 DIAGNOSIS — K648 Other hemorrhoids: Secondary | ICD-10-CM | POA: Diagnosis present

## 2019-02-27 DIAGNOSIS — Z880 Allergy status to penicillin: Secondary | ICD-10-CM | POA: Diagnosis not present

## 2019-02-27 HISTORY — PX: ESOPHAGOGASTRODUODENOSCOPY (EGD) WITH PROPOFOL: SHX5813

## 2019-02-27 LAB — HEPATITIS PANEL, ACUTE
HCV Ab: NONREACTIVE
Hep A IgM: NONREACTIVE
Hep B C IgM: NONREACTIVE
Hepatitis B Surface Ag: NONREACTIVE

## 2019-02-27 LAB — IRON AND TIBC
Iron: 34 ug/dL (ref 28–170)
Saturation Ratios: 8 % — ABNORMAL LOW (ref 10.4–31.8)
TIBC: 419 ug/dL (ref 250–450)
UIBC: 385 ug/dL

## 2019-02-27 LAB — COMPREHENSIVE METABOLIC PANEL
ALT: 156 U/L — ABNORMAL HIGH (ref 0–44)
AST: 104 U/L — ABNORMAL HIGH (ref 15–41)
Albumin: 3.7 g/dL (ref 3.5–5.0)
Alkaline Phosphatase: 54 U/L (ref 38–126)
Anion gap: 6 (ref 5–15)
BUN: 11 mg/dL (ref 6–20)
CO2: 26 mmol/L (ref 22–32)
Calcium: 8.9 mg/dL (ref 8.9–10.3)
Chloride: 107 mmol/L (ref 98–111)
Creatinine, Ser: 0.76 mg/dL (ref 0.44–1.00)
GFR calc Af Amer: >60 mL/min (ref >60)
GFR calc non Af Amer: >60 mL/min (ref >60)
Glucose, Bld: 91 mg/dL (ref 70–99)
Potassium: 3.9 mmol/L (ref 3.5–5.1)
Sodium: 139 mmol/L (ref 135–145)
Total Bilirubin: 1.3 mg/dL — ABNORMAL HIGH (ref 0.3–1.2)
Total Protein: 7 g/dL (ref 6.5–8.1)

## 2019-02-27 LAB — RESPIRATORY PANEL BY RT PCR (FLU A&B, COVID)
Influenza A by PCR: NEGATIVE
Influenza B by PCR: NEGATIVE
SARS Coronavirus 2 by RT PCR: NEGATIVE

## 2019-02-27 LAB — CBC
HCT: 32.8 % — ABNORMAL LOW (ref 36.0–46.0)
Hemoglobin: 10.5 g/dL — ABNORMAL LOW (ref 12.0–15.0)
MCH: 28.2 pg (ref 26.0–34.0)
MCHC: 32 g/dL (ref 30.0–36.0)
MCV: 87.9 fL (ref 80.0–100.0)
Platelets: 232 10*3/uL (ref 150–400)
RBC: 3.73 MIL/uL — ABNORMAL LOW (ref 3.87–5.11)
RDW: 13.8 % (ref 11.5–15.5)
WBC: 5.9 10*3/uL (ref 4.0–10.5)
nRBC: 0 % (ref 0.0–0.2)

## 2019-02-27 LAB — BILIRUBIN, DIRECT: Bilirubin, Direct: 0.3 mg/dL — ABNORMAL HIGH (ref 0.0–0.2)

## 2019-02-27 LAB — GLUCOSE, CAPILLARY
Glucose-Capillary: 128 mg/dL — ABNORMAL HIGH (ref 70–99)
Glucose-Capillary: 78 mg/dL (ref 70–99)

## 2019-02-27 LAB — HIV ANTIBODY (ROUTINE TESTING W REFLEX): HIV Screen 4th Generation wRfx: NONREACTIVE

## 2019-02-27 LAB — FOLATE: Folate: 36 ng/mL (ref >5.9)

## 2019-02-27 LAB — FERRITIN: Ferritin: 15 ng/mL (ref 11–307)

## 2019-02-27 LAB — APTT: aPTT: 35 seconds (ref 24–36)

## 2019-02-27 LAB — SARS CORONAVIRUS 2 (TAT 6-24 HRS): SARS Coronavirus 2: NEGATIVE

## 2019-02-27 LAB — VITAMIN B12: Vitamin B-12: 1474 pg/mL — ABNORMAL HIGH (ref 180–914)

## 2019-02-27 LAB — LIPASE: Lipase: 48 U/L (ref 14–72)

## 2019-02-27 LAB — PROTIME-INR
INR: 1.1 (ref 0.8–1.2)
Prothrombin Time: 14 seconds (ref 11.4–15.2)

## 2019-02-27 SURGERY — EGD (ESOPHAGOGASTRODUODENOSCOPY)
Anesthesia: General

## 2019-02-27 SURGERY — ESOPHAGOGASTRODUODENOSCOPY (EGD) WITH PROPOFOL
Anesthesia: General

## 2019-02-27 MED ORDER — DEXAMETHASONE SODIUM PHOSPHATE 4 MG/ML IJ SOLN
INTRAMUSCULAR | Status: AC
  Filled 2019-02-27: qty 2

## 2019-02-27 MED ORDER — SUCCINYLCHOLINE CHLORIDE 20 MG/ML IJ SOLN
INTRAMUSCULAR | Status: DC | PRN
  Administered 2019-02-27: 80 mg via INTRAVENOUS

## 2019-02-27 MED ORDER — CIPROFLOXACIN IN D5W 400 MG/200ML IV SOLN
400.0000 mg | INTRAVENOUS | Status: AC
  Administered 2019-02-28: 400 mg via INTRAVENOUS

## 2019-02-27 MED ORDER — PROMETHAZINE HCL 25 MG/ML IJ SOLN: 12.5000 mg | Freq: Four times a day (QID) | INTRAMUSCULAR | Status: DC | PRN

## 2019-02-27 MED ORDER — ONDANSETRON HCL 4 MG/2ML IJ SOLN
INTRAMUSCULAR | Status: AC
  Filled 2019-02-27: qty 2

## 2019-02-27 MED ORDER — LIDOCAINE HCL (CARDIAC) PF 100 MG/5ML IV SOSY
PREFILLED_SYRINGE | INTRAVENOUS | Status: DC | PRN
  Administered 2019-02-27: 80 mg via INTRAVENOUS

## 2019-02-27 MED ORDER — SODIUM CHLORIDE 0.9 % IV SOLN
510.0000 mg | Freq: Once | INTRAVENOUS | Status: AC
  Administered 2019-02-27: 510 mg via INTRAVENOUS
  Filled 2019-02-27: qty 17

## 2019-02-27 MED ORDER — LACTATED RINGERS IV SOLN: INTRAVENOUS | Status: DC | PRN

## 2019-02-27 MED ORDER — ONDANSETRON HCL 4 MG/2ML IJ SOLN: 4.0000 mg | Freq: Once | INTRAMUSCULAR | Status: DC | PRN

## 2019-02-27 MED ORDER — PROPOFOL 500 MG/50ML IV EMUL
INTRAVENOUS | Status: DC | PRN
  Administered 2019-02-27: 100 ug via INTRAVENOUS

## 2019-02-27 MED ORDER — FENTANYL CITRATE (PF) 100 MCG/2ML IJ SOLN
INTRAMUSCULAR | Status: AC
  Filled 2019-02-27: qty 2

## 2019-02-27 MED ORDER — FENTANYL CITRATE (PF) 100 MCG/2ML IJ SOLN: 25.0000 ug | INTRAMUSCULAR | Status: DC | PRN

## 2019-02-27 MED ORDER — ONDANSETRON HCL 4 MG/2ML IJ SOLN
INTRAMUSCULAR | Status: DC | PRN
  Administered 2019-02-27: 4 mg via INTRAVENOUS

## 2019-02-27 MED ORDER — ROCURONIUM BROMIDE 50 MG/5ML IV SOLN
INTRAVENOUS | Status: AC
  Filled 2019-02-27: qty 1

## 2019-02-27 MED ORDER — PROMETHAZINE HCL 25 MG/ML IJ SOLN
INTRAMUSCULAR | Status: AC
  Administered 2019-02-27: 12.5 mg via INTRAVENOUS
  Filled 2019-02-27: qty 1

## 2019-02-27 MED ORDER — DEXAMETHASONE SODIUM PHOSPHATE 10 MG/ML IJ SOLN
INTRAMUSCULAR | Status: DC | PRN
  Administered 2019-02-27: 8 mg via INTRAVENOUS

## 2019-02-27 MED ORDER — DEXTROSE-NACL 5-0.45 % IV SOLN: INTRAVENOUS | Status: AC

## 2019-02-27 MED ORDER — HEPARIN SODIUM (PORCINE) 5000 UNIT/ML IJ SOLN
INTRAMUSCULAR | Status: AC
  Filled 2019-02-27: qty 1

## 2019-02-27 MED ORDER — PROPOFOL 10 MG/ML IV BOLUS
INTRAVENOUS | Status: DC | PRN
  Administered 2019-02-27: 150 mg via INTRAVENOUS

## 2019-02-27 NOTE — ED Notes (Signed)
Per GI consult, pt should have rapid Covid swab sent d/t need to go to endoscopy ASAP. Order placed by [facility].

## 2019-02-27 NOTE — Progress Notes (Signed)
PROGRESS NOTE    Annette Phillips  RJJ:[id number] DOB: 27-Feb-1965 DOA: 02/26/2019 PCP: Erasmo Downer, MD   Brief Narrative:  54 y.o. female with medical history significant of history of hypothyroidism, external hemorrhoids comes to the hospital with complaints of epigastric and right upper quadrant abdominal pain along with nausea.  Was found to have transaminitis, esophagitis with reactive lymphadenopathy and gallbladder polyp.  GI and general surgery were consulted.  GI planning of endoscopic evaluation.   Assessment & Plan:   Principal Problem:   Intractable nausea and vomiting Active Problems:   Hypothyroid   External hemorrhoid, bleeding   Transaminitis   Gallbladder polyp   Esophagitis  Intractable nausea and vomiting, multifactorial Esophagitis with lymphadenopathy, reactive? Gallbladder polyps -Patient is still nauseous unable to tolerate p.o. this morning. -CT scan reviewed.  PPI IV twice daily, Carafate 3 times daily with meals -GI following-plans for endoscopic evaluation today. -General surgery following-if necessary and failure of resolution of her symptoms, she will likely require laparoscopic cholecystectomy -Supportive care.  Transaminitis -Morning labs and acute hepatitis panel is pending.  History of hypothyroidism -Continue Synthroid.    DVT prophylaxis: SQ hep, to be help prior to her procedure at GI discretion  Code Status: Full code Family Communication: None Disposition Plan: Maintain hospital stay as patient is still quite nauseous, she will require endoscopic evaluation daily and possibly laparoscopic cholecystectomy depending on her symptoms  Consultants:   GI  General surgery  Procedures:   None thus far  Antimicrobials:   None   Subjective: Still having feeling of nausea.  Describes her pain is still in epigastric region going towards the right closer to her back.  Review of Systems Otherwise negative except as per HPI,  including: General: Denies fever, chills, night sweats or unintended weight loss. Resp: Denies cough, wheezing, shortness of breath. Cardiac: Denies chest pain, palpitations, orthopnea, paroxysmal nocturnal dyspnea. GI: Denies, diarrhea or constipation GU: Denies dysuria, frequency, hesitancy or incontinence MS: Denies muscle aches, joint pain or swelling Neuro: Denies headache, neurologic deficits (focal weakness, numbness, tingling), abnormal gait Psych: Denies anxiety, depression, SI/HI/AVH Skin: Denies new rashes or lesions ID: Denies sick contacts, exotic exposures, travel  Objective: Vitals:   02/26/19 1620 02/26/19 1830 02/27/19 0600 02/27/19 0634  BP: 125/77 140/77  (!) 123/95  Pulse: 66  62   Resp: 16   16  Temp:    98.5 F (36.9 C)  TempSrc:    Oral  SpO2: 99%  96%   Weight:      Height:        Intake/Output Summary (Last 24 hours) at 02/27/2019 0807 Last data filed at 02/26/2019 1652 Gross per 24 hour  Intake 1000 ml  Output --  Net 1000 ml   Filed Weights   02/26/19 1421  Weight: 70.3 kg    Examination:  General exam: Appears calm and comfortable  Respiratory system: Clear to auscultation. Respiratory effort normal. Cardiovascular system: S1 & S2 heard, RRR. No JVD, murmurs, rubs, gallops or clicks. No pedal edema. Gastrointestinal system: Abdomen is nondistended, soft.  Tenderness to deep palpation in the epigastric region no organomegaly or masses felt. Normal bowel sounds heard. Central nervous system: Alert and oriented. No focal neurological deficits. Extremities: Symmetric 5 x 5 power. Skin: No rashes, lesions or ulcers Psychiatry: Judgement and insight appear normal. Mood & affect appropriate.     Data Reviewed:   CBC: Recent Labs  Lab 02/26/19 1431  WBC 7.6  HGB 11.4*  HCT 34.7*  MCV 86.3  PLT 265   Basic Metabolic Panel: Recent Labs  Lab 02/26/19 1431  NA 136  K 3.7  CL 103  CO2 24  GLUCOSE 118*  BUN 21*  CREATININE 0.76   CALCIUM 9.5   GFR: Estimated Creatinine Clearance: 78.2 mL/min (by C-G formula based on SCr of 0.76 mg/dL). Liver Function Tests: Recent Labs  Lab 02/21/19 0814 02/26/19 1431  AST 211* 135*  ALT 278* 204*  ALKPHOS 83 67  BILITOT 0.8 1.2  PROT 6.6 8.0  ALBUMIN 4.0 4.3   Recent Labs  Lab 02/26/19 0927 02/26/19 1431  LIPASE 48 37   No results for input(s): AMMONIA in the last 168 hours. Coagulation Profile: No results for input(s): INR, PROTIME in the last 168 hours. Cardiac Enzymes: No results for input(s): CKTOTAL, CKMB, CKMBINDEX, TROPONINI in the last 168 hours. BNP (last 3 results) No results for input(s): PROBNP in the last 8760 hours. HbA1C: No results for input(s): HGBA1C in the last 72 hours. CBG: Recent Labs  Lab 02/27/19 0801  GLUCAP 78   Lipid Profile: No results for input(s): CHOL, HDL, LDLCALC, TRIG, CHOLHDL, LDLDIRECT in the last 72 hours. Thyroid Function Tests: Recent Labs    02/26/19 1431  TSH 0.027*   Anemia Panel: No results for input(s): VITAMINB12, FOLATE, FERRITIN, TIBC, IRON, RETICCTPCT in the last 72 hours. Sepsis Labs: No results for input(s): PROCALCITON, LATICACIDVEN in the last 168 hours.  No results found for this or any previous visit (from the past 240 hour(s)).       Radiology Studies: CT ABDOMEN PELVIS W CONTRAST  Result Date: 02/26/2019 CLINICAL DATA:  Right upper quadrant pain, no fever, no elevated white blood cell count. EXAM: CT ABDOMEN AND PELVIS WITH CONTRAST TECHNIQUE: Multidetector CT imaging of the abdomen and pelvis was performed using the standard protocol following bolus administration of intravenous contrast. CONTRAST:  OMNIPAQUE IOHEXOL 300 MG/ML  SOLN COMPARISON:  Same day ultrasound, CT abdomen pelvis 05/15/2008 FINDINGS: Lower chest: Atelectatic changes in the lung bases. Lung bases otherwise clear. Normal heart size. No pericardial effusion. Hepatobiliary: No focal liver abnormality is seen. Multiple  gallbladder polyps are better visualized on same day ultrasound. No gallstones, gallbladder wall thickening, or biliary dilatation. Pancreas: Unremarkable. No pancreatic ductal dilatation or surrounding inflammatory changes. Spleen: Normal in size without focal abnormality. Adrenals/Urinary Tract: Normal adrenal glands. Kidneys enhance and excrete symmetrically. No concerning renal lesions. No urolithiasis or hydronephrosis. Urinary bladder is unremarkable. Stomach/Bowel: Mild hazy stranding anterior to the GE junction with few reactive appearing lymph nodes. Minimal if any thickening in the visible mucosa though this is poorly assessed due to underdistention. Slight thickening is present at the gastric antrum as well though could reflect normal contraction. Duodenal sweep is unremarkable. No small bowel dilatation or wall thickening. The appendix is surgically absent. No colonic dilatation or wall thickening. Vascular/Lymphatic: Atherosclerotic plaque within the normal caliber aorta. Reactive adenopathy the adjacent the GE junction/upper abdomen as above with a borderline enlarged node measuring 10 mm (2/18). Reproductive: Retroflexed uterus. No concerning adnexal lesions. Other: No free fluid. No free air. No bowel containing hernias. Musculoskeletal: Multilevel degenerative changes are present in the imaged portions of the spine. No acute osseous abnormality or suspicious osseous lesion. IMPRESSION: 1. Mild hazy stranding anterior to the GE junction/upper abdomen with few reactive appearing lymph nodes. Minimal if any thickening in the visible mucosa though this is poorly assessed due to underdistention. Slight thickening is present at the gastric antrum as well  though this is poorly assessed due to underdistention. In the setting upper abdominal pain, could reflect esophagitis or gastritis. Recommend correlation with upper endoscopy. 2. Multiple gallbladder polyps are better visualized on same day ultrasound. 3.   Aortic Atherosclerosis (ICD10-I70.0). 4. Retroflexed uterus. Electronically Signed   By: Lovena Le M.D.   On: 02/26/2019 16:06   US Abdomen Limited RUQ  Result Date: 02/26/2019 CLINICAL DATA:  Nausea.  Elevated liver enzymes EXAM: ULTRASOUND ABDOMEN LIMITED RIGHT UPPER QUADRANT COMPARISON:  CT abdomen and pelvis May 15, 2008 FINDINGS: Gallbladder: No within the gallbladder, there are multiple echogenic foci which neither move nor shadow consistent with cholelithiasis. There are two polyps, each measuring 6 mm in length. A 5 mm polyp is also noted. Other polyps seen measure 2 mm and 3 mm respectively. A questionable 6 polyp is present measuring 2 mm. There are no echogenic foci in the gallbladder which move and shadow as is expected with gallstones. No gallbladder wall thickening or pericholecystic fluid. No sonographic Murphy sign noted by sonographer. Common bile duct: Diameter: 3 mm. No intrahepatic or extrahepatic biliary duct dilatation. Liver: No focal lesion identified. Within normal limits in parenchymal echogenicity. Portal vein is patent on color Doppler imaging with normal direction of blood flow towards the liver. Other: None. IMPRESSION: Multiple apparent polyps in the gallbladder, largest measuring 6 mm in length. Per consensus guidelines, polyps in the size range present on this study do not warrant additional imaging surveillance. No evident gallstones, gallbladder wall thickening, or pericholecystic fluid. Study otherwise unremarkable. Electronically Signed   By: Lowella Grip III M.D.   On: 02/26/2019 15:20        Scheduled Meds: . heparin  5,000 Units Subcutaneous Q8H  . levothyroxine  100 mcg Oral Q0600  . pantoprazole (PROTONIX) IV  40 mg Intravenous Q12H  . sodium chloride flush  3 mL Intravenous Once  . sucralfate  1 g Oral TID WC & HS   Continuous Infusions: . sodium chloride 100 mL/hr at 02/27/19 0633     LOS: 0 days   Time spent= 35 mins    Ivori Storr Arsenio Loader, MD [facility]  If 7PM-7AM, please contact night-coverage  02/27/2019, 8:07 AM

## 2019-02-27 NOTE — Anesthesia Preprocedure Evaluation (Addendum)
Anesthesia Evaluation  Patient identified by MRN, date of birth, ID band Patient awake    Reviewed: Allergy & Precautions, H&P , NPO status , Patient's Chart, lab work & pertinent test results, reviewed documented beta blocker date and time   Airway Mallampati: II  TM Distance: >3 FB Neck ROM: full    Dental  (+) Teeth Intact   Pulmonary neg pulmonary ROS,    Pulmonary exam normal        Cardiovascular negative cardio ROS Normal cardiovascular exam Rhythm:regular Rate:Normal     Neuro/Psych negative neurological ROS  negative psych ROS   GI/Hepatic negative GI ROS, Neg liver ROS,   Endo/Other  negative endocrine ROS  Renal/GU negative Renal ROS  negative genitourinary   Musculoskeletal   Abdominal   Peds  Hematology negative hematology ROS (+)   Anesthesia Other Findings Past Medical History: No date: Goiter No date: Hypothyroid No date: Prediabetes 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     Dental Advisory Given  Plan Discussed with: CRNA  Anesthesia Plan Comments:         Anesthesia Quick Evaluation

## 2019-02-27 NOTE — Consult Note (Addendum)
Cephas Darby, MD [address]  [place]  [place], [place] [address]  Main: [phone]  Fax: [phone] Pager: [phone]   Consultation  Referring Provider:     No ref. provider found Primary Care Physician:  Virginia Crews, MD Primary Gastroenterologist: Althia Forts        Reason for Consultation:     Dyspepsia, elevated LFTs  Date of Admission:  02/26/2019 Date of Consultation:  02/27/2019         HPI:   Annette Phillips is a 54 y.o. female with history of hypothyroidism on Synthroid who presented with 1 week history of epigastric pain, right upper quadrant pain associated with nausea and poor p.o. intake.  Patient went to see her PCP in last week of December for annual physical, labs incidentally revealed elevated transaminases AST 211, ALT 278.  She was asymptomatic at her PCPs appointment.  She has been feeling nauseous for last 1 week, yesterday she had severe epigastric pain radiating to her right lower back as well as in the right upper quadrant which was mild.  She denies diarrhea, fever, chills, vomiting, melena, rectal bleeding.  She works at [employer].  Denies any contacts with known Covid.  Denies consuming contaminated food, undercooked meat or expired food.  Today her labs revealed AST 104, ALT 156, T bili 1.3, alkaline phosphatase 54.  Lipase was normal.  No evidence of leukocytosis.  Does have mild normocytic anemia Patient underwent ultrasound right upper quadrant and CT abdomen and pelvis with contrast in the ER which revealed gallbladder polyps, gastric wall thickening Therefore, GI is consulted for further evaluation Patient reports mild epigastric and right upper quadrant discomfort COVID-19 swab has been sent She denies smoking or alcohol use NSAIDs: None  Antiplts/Anticoagulants/Anti thrombotics: None  GI Procedures: None She denies family history of GI malignancy, inflammatory bowel disease, celiac disease  Past Medical History:    Diagnosis Date  . Goiter   . Hypothyroid   . Prediabetes   . Recurrent cold sores     Past Surgical History:  Procedure Laterality Date  . APPENDECTOMY    . KNEE ARTHROSCOPY Left 2008   torn meniscus    Prior to Admission medications   Medication Sig Start Date End Date Taking? Authorizing Provider  Multiple Vitamin (MULTI-VITAMINS) TABS Take 1 tablet by mouth daily.    Yes [provider]  SYNTHROID 100 MCG tablet TAKE 1 TABLET BY MOUTH EVERY DAY (NEEDS OFFICE VISIT FOR FUTURE REFILL) Patient taking differently: Take 100 mcg by mouth every evening.  02/04/19  Yes Bacigalupo, Dionne Bucy, MD  hydrocortisone-pramoxine [facility]) rectal foam Place 1 applicator rectally 2 (two) times daily. Patient not taking: Reported on 02/26/2019 01/30/19   Virginia Crews, MD   Current Facility-Administered Medications:  .  acetaminophen (TYLENOL) tablet 650 mg, 650 mg, Oral, Q6H PRN **OR** acetaminophen (TYLENOL) suppository 650 mg, 650 mg, Rectal, Q6H PRN, Amin, Ankit Chirag, MD .  dextrose 5 %-0.45 % sodium chloride infusion, , Intravenous, Continuous, Amin, Ankit Chirag, MD, Last Rate: 75 mL/hr at 02/27/19 0821, New Bag at 02/27/19 0821 .  heparin injection 5,000 Units, 5,000 Units, Subcutaneous, Q8H, Amin, Ankit Chirag, MD, 5,000 Units at 02/27/19 8676 .  levothyroxine (SYNTHROID) tablet 100 mcg, 100 mcg, Oral, Q0600, Amin, Ankit Chirag, MD .  morphine 2 MG/ML injection 2 mg, 2 mg, Intravenous, Q4H PRN, Amin, Ankit Chirag, MD, 2 mg at 02/27/19 0053 .  ondansetron (ZOFRAN) tablet 4 mg, 4 mg, Oral,  Q6H PRN **OR** ondansetron (ZOFRAN) injection 4 mg, 4 mg, Intravenous, Q6H PRN, Amin, Ankit Chirag, MD, 4 mg at 02/27/19 0959 .  pantoprazole (PROTONIX) injection 40 mg, 40 mg, Intravenous, Q12H, Amin, Ankit Chirag, MD, 40 mg at 02/27/19 0959 .  senna-docusate (Senokot-S) tablet 1 tablet, 1 tablet, Oral, QHS PRN, Amin, Ankit Chirag, MD .  sodium chloride flush (NS) 0.9 % injection 3 mL,  3 mL, Intravenous, Once, Gregor Hams, MD .  sucralfate (CARAFATE) 1 GM/10ML suspension 1 g, 1 g, Oral, TID WC & HS, Damita Lack, MD, Stopped at 02/27/19 5102  Current Outpatient Medications:  Marland Kitchen  Multiple Vitamin (MULTI-VITAMINS) TABS, Take 1 tablet by mouth daily. , Disp: , Rfl:  .  SYNTHROID 100 MCG tablet, TAKE 1 TABLET BY MOUTH EVERY DAY (NEEDS OFFICE VISIT FOR FUTURE REFILL) (Patient taking differently: Take 100 mcg by mouth every evening. ), Disp: 90 tablet, Rfl: 0 .  hydrocortisone-pramoxine (PROCTOFOAM-HC) rectal foam, Place 1 applicator rectally 2 (two) times daily. (Patient not taking: Reported on 02/26/2019), Disp: 10 g, Rfl: 3   Family History  Problem Relation Age of Onset  . Thyroid disease Mother   . Diabetes Mellitus II Mother   . Thyroid disease Father   . Hypothyroidism Daughter   . Hypothyroidism Son   . Diabetes Mellitus I Son   . Kidney cancer Maternal Grandmother   . Lung cancer Maternal Grandfather        smoker  . Colon cancer Neg Hx   . Breast cancer Neg Hx      Social History   Tobacco Use  . Smoking status: Never Smoker  . Smokeless tobacco: Never Used  Substance Use Topics  . Alcohol use: Yes    Alcohol/week: 0.0 standard drinks    Comment: rare  . Drug use: Never    Allergies as of 02/26/2019 - Review Complete 02/26/2019  Allergen Reaction Noted  . Aspirin Other (See Comments) 04/30/2014  . Penicillin g Other (See Comments) 04/30/2014    Review of Systems:    All systems reviewed and negative except where noted in HPI.   Physical Exam:  Vital signs in last 24 hours: Temp:  [98.2 F (36.8 C)-98.5 F (36.9 C)] 98.5 F (36.9 C) (01/13 0634) Pulse Rate:  [62-123] 70 (01/13 0815) Resp:  [16-18] 18 (01/13 0815) BP: (120-144)/(70-95) 120/70 (01/13 0815) SpO2:  [96 %-100 %] 96 % (01/13 0815) Weight:  [70.3 kg] 70.3 kg (01/12 1421)   General:   Pleasant, cooperative in NAD Head:  Normocephalic and atraumatic. Eyes:   No  icterus.   Conjunctiva pink. PERRLA. Ears:  Normal auditory acuity. Neck:  Supple; no masses or thyroidomegaly Lungs: Respirations even and unlabored. Lungs clear to auscultation bilaterally.   No wheezes, crackles, or rhonchi.  Heart:  Regular rate and rhythm;  Without murmur, clicks, rubs or gallops Abdomen:  Soft, nondistended, mild epigastric and right upper quadrant tenderness. Normal bowel sounds. No appreciable masses or hepatomegaly.  No rebound or guarding.  Rectal:  Not performed. Msk:  Symmetrical without gross deformities.  Strength normal Extremities:  Without edema, cyanosis or clubbing. Neurologic:  Alert and oriented x3;  grossly normal neurologically. Skin:  Intact without significant lesions or rashes. Psych:  Alert and cooperative. Normal affect.  LAB RESULTS: CBC Latest Ref Rng & Units 02/27/2019 02/26/2019 01/30/2019  WBC 4.0 - 10.5 K/uL 5.9 7.6 6.5  Hemoglobin 12.0 - 15.0 g/dL 10.5(L) 11.4(L) 12.2  Hematocrit 36.0 - 46.0 % 32.8(L) 34.7(L) 36.3  Platelets 150 - 400 K/uL 232 265 223    BMET BMP Latest Ref Rng & Units 02/27/2019 02/26/2019 01/30/2019  Glucose 70 - 99 mg/dL 91 118(H) 105(H)  BUN 6 - 20 mg/dL 11 21(H) 19  Creatinine 0.44 - 1.00 mg/dL 0.76 0.76 0.73  BUN/Creat Ratio 9 - 23 - - 26(H)  Sodium 135 - 145 mmol/L 139 136 138  Potassium 3.5 - 5.1 mmol/L 3.9 3.7 4.2  Chloride 98 - 111 mmol/L 107 103 102  CO2 22 - 32 mmol/L '26 24 23  ' Calcium 8.9 - 10.3 mg/dL 8.9 9.5 9.8    LFT Hepatic Function Latest Ref Rng & Units 02/27/2019 02/26/2019 02/21/2019  Total Protein 6.5 - 8.1 g/dL 7.0 8.0 6.6  Albumin 3.5 - 5.0 g/dL 3.7 4.3 4.0  AST 15 - 41 U/L 104(H) 135(H) 211(H)  ALT 0 - 44 U/L 156(H) 204(H) 278(H)  Alk Phosphatase 38 - 126 U/L 54 67 83  Total Bilirubin 0.3 - 1.2 mg/dL 1.3(H) 1.2 0.8  Bilirubin, Direct 0.0 - 0.2 mg/dL - 0.3(H) 0.33     STUDIES: CT ABDOMEN PELVIS W CONTRAST  Result Date: 02/26/2019 CLINICAL DATA:  Right upper quadrant pain, no fever, no  elevated white blood cell count. EXAM: CT ABDOMEN AND PELVIS WITH CONTRAST TECHNIQUE: Multidetector CT imaging of the abdomen and pelvis was performed using the standard protocol following bolus administration of intravenous contrast. CONTRAST:  182m OMNIPAQUE IOHEXOL 300 MG/ML  SOLN COMPARISON:  Same day ultrasound, CT abdomen pelvis 05/15/2008 FINDINGS: Lower chest: Atelectatic changes in the lung bases. Lung bases otherwise clear. Normal heart size. No pericardial effusion. Hepatobiliary: No focal liver abnormality is seen. Multiple gallbladder polyps are better visualized on same day ultrasound. No gallstones, gallbladder wall thickening, or biliary dilatation. Pancreas: Unremarkable. No pancreatic ductal dilatation or surrounding inflammatory changes. Spleen: Normal in size without focal abnormality. Adrenals/Urinary Tract: Normal adrenal glands. Kidneys enhance and excrete symmetrically. No concerning renal lesions. No urolithiasis or hydronephrosis. Urinary bladder is unremarkable. Stomach/Bowel: Mild hazy stranding anterior to the GE junction with few reactive appearing lymph nodes. Minimal if any thickening in the visible mucosa though this is poorly assessed due to underdistention. Slight thickening is present at the gastric antrum as well though could reflect normal contraction. Duodenal sweep is unremarkable. No small bowel dilatation or wall thickening. The appendix is surgically absent. No colonic dilatation or wall thickening. Vascular/Lymphatic: Atherosclerotic plaque within the normal caliber aorta. Reactive adenopathy the adjacent the GE junction/upper abdomen as above with a borderline enlarged node measuring 10 mm (2/18). Reproductive: Retroflexed uterus. No concerning adnexal lesions. Other: No free fluid. No free air. No bowel containing hernias. Musculoskeletal: Multilevel degenerative changes are present in the imaged portions of the spine. No acute osseous abnormality or suspicious osseous  lesion. IMPRESSION: 1. Mild hazy stranding anterior to the GE junction/upper abdomen with few reactive appearing lymph nodes. Minimal if any thickening in the visible mucosa though this is poorly assessed due to underdistention. Slight thickening is present at the gastric antrum as well though this is poorly assessed due to underdistention. In the setting upper abdominal pain, could reflect esophagitis or gastritis. Recommend correlation with upper endoscopy. 2. Multiple gallbladder polyps are better visualized on same day ultrasound. 3.  Aortic Atherosclerosis (ICD10-I70.0). 4. Retroflexed uterus. Electronically Signed   By: PLovena LeM.D.   On: 02/26/2019 16:06   [place]Abdomen Limited RUQ  Result Date: 02/26/2019 CLINICAL DATA:  Nausea.  Elevated liver enzymes EXAM: ULTRASOUND ABDOMEN LIMITED RIGHT  UPPER QUADRANT COMPARISON:  CT abdomen and pelvis May 15, 2008 FINDINGS: Gallbladder: No within the gallbladder, there are multiple echogenic foci which neither move nor shadow consistent with cholelithiasis. There are two polyps, each measuring 6 mm in length. A 5 mm polyp is also noted. Other polyps seen measure 2 mm and 3 mm respectively. A questionable 6 polyp is present measuring 2 mm. There are no echogenic foci in the gallbladder which move and shadow as is expected with gallstones. No gallbladder wall thickening or pericholecystic fluid. No sonographic Murphy sign noted by sonographer. Common bile duct: Diameter: 3 mm. No intrahepatic or extrahepatic biliary duct dilatation. Liver: No focal lesion identified. Within normal limits in parenchymal echogenicity. Portal vein is patent on color Doppler imaging with normal direction of blood flow towards the liver. Other: None. IMPRESSION: Multiple apparent polyps in the gallbladder, largest measuring 6 mm in length. Per consensus guidelines, polyps in the size range present on this study do not warrant additional imaging surveillance. No evident gallstones,  gallbladder wall thickening, or pericholecystic fluid. Study otherwise unremarkable. Electronically Signed   By: Lowella Grip III M.D.   On: 02/26/2019 15:20      Impression / Plan:   Annette Phillips is a 54 y.o. female with with history of hypothyroidism on Synthroid seen in consultation for dyspepsia, elevated transaminases  Dyspepsia Recommend EGD with gastric and duodenal biopsies for further evaluation after Covid test result N.p.o. except meds PPI twice daily  Elevated LFTs with no evidence of biliary obstruction Acute viral hepatitis panel pending General surgery is on board to evaluate for gallbladder etiology  Normocytic anemia, no signs or symptoms to suggest GI bleed Check iron panel, B12 and folate panel  Thank you for involving me in the care of this patient.  We will follow along with you    LOS: 0 days   Sherri Sear, MD  02/27/2019, 10:55 AM   Note: This dictation was prepared with Dragon dictation along with smaller phrase technology. Any transcriptional errors that result from this process are unintentional.

## 2019-02-27 NOTE — ED Notes (Signed)
Pt transported to Endo at this time 

## 2019-02-27 NOTE — Anesthesia Preprocedure Evaluation (Addendum)
Anesthesia Evaluation  Patient identified by MRN, date of birth, ID band Patient awake    Reviewed: Allergy & Precautions, NPO status , Patient's Chart, lab work & pertinent test results  History of Anesthesia Complications Negative for: history of anesthetic complications  Airway Mallampati: II  TM Distance: >3 FB Neck ROM: Full    Dental no notable dental hx. (+) Teeth Intact, Dental Advisory Given   Pulmonary neg pulmonary ROS, neg sleep apnea, neg COPD, Patient abstained from smoking.Not current smoker,    Pulmonary exam normal breath sounds clear to auscultation       Cardiovascular Exercise Tolerance: Good METS(-) hypertension(-) CAD and (-) Past MI negative cardio ROS Normal cardiovascular exam(-) dysrhythmias  Rhythm:Regular Rate:Normal - Systolic murmurs    Neuro/Psych negative neurological ROS  negative psych ROS   GI/Hepatic Neg liver ROS, neg GERD  ,  Endo/Other  neg diabetesHypothyroidism   Renal/GU negative Renal ROS  negative genitourinary   Musculoskeletal negative musculoskeletal ROS (+)   Abdominal Normal abdominal exam  (+)   Peds negative pediatric ROS (+)  Hematology negative hematology ROS (+)   Anesthesia Other Findings Past Medical History: No date: Goiter No date: Hypothyroid No date: Prediabetes 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  Reproductive/Obstetrics                            Anesthesia Physical Anesthesia Plan  ASA: II  Anesthesia Plan: General   Post-op Pain Management:    Induction: Intravenous, Rapid sequence and Cricoid pressure planned  PONV Risk Score and Plan: 3 and TIVA, Propofol infusion, Dexamethasone and Ondansetron  Airway Management Planned: Oral ETT  Additional Equipment: None  Intra-op Plan:   Post-operative Plan: Extubation in OR  Informed Consent: I have reviewed the patients History and Physical, chart, labs and discussed the  procedure including the risks, benefits and alternatives for the proposed anesthesia with the patient or authorized representative who has indicated his/her understanding and acceptance.     Dental advisory given  Plan Discussed with: CRNA and Surgeon  Anesthesia Plan Comments: (Discussed risks of anesthesia with patient, including PONV, aspiration, sore throat, lip/dental damage. Rare risks discussed as well, such as cardiorespiratory sequelae. Patient understands.  Patient actively nauseated and vomiting. Will plan for GETA)      Anesthesia Quick Evaluation

## 2019-02-27 NOTE — ED Notes (Signed)
Pt ambulatory to bathroom independently

## 2019-02-27 NOTE — Anesthesia Procedure Notes (Signed)
Procedure Name: Intubation Performed by: Demetrius Charity, CRNA Pre-anesthesia Checklist: Patient identified, Patient being monitored, Timeout performed, Emergency Drugs available and Suction available Patient Re-evaluated:Patient Re-evaluated prior to induction Oxygen Delivery Method: Circle system utilized Preoxygenation: Pre-oxygenation with 100% oxygen Induction Type: IV induction, Rapid sequence and Cricoid Pressure applied Laryngoscope Size: Mac and 3 Grade View: Grade II Tube type: Oral Tube size: 7.0 mm Number of attempts: 1 Airway Equipment and Method: Stylet Placement Confirmation: ETT inserted through vocal cords under direct vision,  positive ETCO2 and breath sounds checked- equal and bilateral Secured at: 21 cm Tube secured with: Tape Dental Injury: Teeth and Oropharynx as per pre-operative assessment

## 2019-02-27 NOTE — Op Note (Signed)
[facility] Patient Name: Annette Phillips Procedure Date: 02/27/2019 11:52 AM MRN: [id number] Account #: [id number] Date of Birth: May 02, 1965 Admit Type: Outpatient Age: 54 Room: [facility] ENDO ROOM 2 Gender: Female Note Status: Finalized Procedure:             Upper GI endoscopy Indications:           Epigastric abdominal pain, Abdominal pain in the right                         upper quadrant, Dyspepsia Providers:             Toney Reil MD, MD Referring MD:          Marzella Schlein. Bacigalupo (Referring MD) Medicines:             Monitored Anesthesia Care, General Anesthesia Complications:         No immediate complications. Estimated blood loss: None. Procedure:             Pre-Anesthesia Assessment:                        - Prior to the procedure, a History and Physical was                         performed, and patient medications and allergies were                         reviewed. The patient is competent. The risks and                         benefits of the procedure and the sedation options and                         risks were discussed with the patient. All questions                         were answered and informed consent was obtained.                         Patient identification and proposed procedure were                         verified by the physician, the nurse, the                         anesthesiologist, the anesthetist and the technician                         in the pre-procedure area in the procedure room in the                         endoscopy suite. Mental Status Examination: alert and                         oriented. Airway Examination: normal oropharyngeal                         airway and neck mobility. Respiratory Examination:  clear to auscultation. CV Examination: normal.                         Prophylactic Antibiotics: The patient does not require                         prophylactic  antibiotics. Prior Anticoagulants: The                         patient has taken no previous anticoagulant or                         antiplatelet agents. ASA Grade Assessment: II - A                         patient with mild systemic disease. After reviewing                         the risks and benefits, the patient was deemed in                         satisfactory condition to undergo the procedure. The                         anesthesia plan was to use general anesthesia.                         Immediately prior to administration of medications,                         the patient was re-assessed for adequacy to receive                         sedatives. The heart rate, respiratory rate, oxygen                         saturations, blood pressure, adequacy of pulmonary                         ventilation, and response to care were monitored                         throughout the procedure. The physical status of the                         patient was re-assessed after the procedure.                        After obtaining informed consent, the endoscope was                         passed under direct vision. Throughout the procedure,                         the patient's blood pressure, pulse, and oxygen                         saturations were monitored continuously. The Endoscope  was introduced through the mouth, and advanced to the                         second part of duodenum. The upper GI endoscopy was                         accomplished without difficulty. The patient tolerated                         the procedure well. Findings:      The duodenal bulb and second portion of the duodenum were normal.       Biopsies for histology were taken with a cold forceps for evaluation of       celiac disease.      A small hiatal hernia was present.      The entire examined stomach was normal. Biopsies were taken with a cold       forceps for Helicobacter pylori  testing. Petechiael spots in fundus are       noticed from retching and vomiting in pre-op area      Esophagogastric landmarks were identified: the gastroesophageal junction       was found at 40 cm from the incisors.      The gastroesophageal junction and examined esophagus were normal.      Bilious fluid was found in the gastric fundus and in the gastric body. Impression:            - Normal duodenal bulb and second portion of the                         duodenum. Biopsied.                        - Small hiatal hernia.                        - Normal stomach. Biopsied.                        - Esophagogastric landmarks identified.                        - Normal gastroesophageal junction and esophagus.                        - Bilious gastric fluid. Recommendation:        - Return patient to hospital ward for ongoing care.                        - Advance diet as tolerated today.                        - Continue present medications.                        - Await pathology results. Procedure Code(s):     --- Professional ---                        [id number], Esophagogastroduodenoscopy, flexible,  transoral; with biopsy, single or multiple Diagnosis Code(s):     --- Professional ---                        K44.9, Diaphragmatic hernia without obstruction or                         gangrene                        R10.13, Epigastric pain                        R10.11, Right upper quadrant pain CPT copyright 2019 American Medical Association. All rights reserved. The codes documented in this report are preliminary and upon coder review may  be revised to meet current compliance requirements. Dr. Libby Maw Toney Reil MD, MD 02/27/2019 1:59:14 PM This report has been signed electronically. Number of Addenda: 0 Note Initiated On: 02/27/2019 11:52 AM Estimated Blood Loss:  Estimated blood loss: none.      [facility]

## 2019-02-27 NOTE — Transfer of Care (Signed)
Immediate Anesthesia Transfer of Care Note  Patient: Ladena Jacquez  Procedure(s) Performed: ESOPHAGOGASTRODUODENOSCOPY (EGD) WITH PROPOFOL (N/A )  Patient Location: PACU  Anesthesia Type:General  Level of Consciousness: awake and alert   Airway & Oxygen Therapy: Patient Spontanous Breathing and Patient connected to nasal cannula oxygen  Post-op Assessment: Report given to RN and Post -op Vital signs reviewed and stable  Post vital signs: Reviewed and stable  Last Vitals:  Vitals Value Taken Time  BP    Temp    Pulse 79 02/27/19 1410  Resp 10 02/27/19 1410  SpO2 100 % 02/27/19 1410  Vitals shown include unvalidated device data.  Last Pain:  Vitals:   02/27/19 1316  TempSrc: Oral  PainSc:       Patients Stated Pain Goal: 0 (02/27/19 0057)  Complications: No apparent anesthesia complications

## 2019-02-28 ENCOUNTER — Inpatient Hospital Stay: Payer: BC Managed Care – PPO | Admitting: Anesthesiology

## 2019-02-28 ENCOUNTER — Encounter: Payer: Self-pay | Admitting: Internal Medicine

## 2019-02-28 ENCOUNTER — Encounter
Admission: EM | Disposition: A | Discharge status: Home / Self Care | Payer: Self-pay | Source: Ambulatory Visit | Attending: Internal Medicine

## 2019-02-28 DIAGNOSIS — R112 Nausea with vomiting, unspecified: Secondary | ICD-10-CM

## 2019-02-28 HISTORY — PX: CHOLECYSTECTOMY: SHX55

## 2019-02-28 LAB — COMPREHENSIVE METABOLIC PANEL
ALT: 123 U/L — ABNORMAL HIGH (ref 0–44)
AST: 71 U/L — ABNORMAL HIGH (ref 15–41)
Albumin: 3.7 g/dL (ref 3.5–5.0)
Alkaline Phosphatase: 58 U/L (ref 38–126)
Anion gap: 6 (ref 5–15)
BUN: 14 mg/dL (ref 6–20)
CO2: 24 mmol/L (ref 22–32)
Calcium: 9.1 mg/dL (ref 8.9–10.3)
Chloride: 107 mmol/L (ref 98–111)
Creatinine, Ser: 0.74 mg/dL (ref 0.44–1.00)
GFR calc Af Amer: >60 mL/min (ref >60)
GFR calc non Af Amer: >60 mL/min (ref >60)
Glucose, Bld: 158 mg/dL — ABNORMAL HIGH (ref 70–99)
Potassium: 3.9 mmol/L (ref 3.5–5.1)
Sodium: 137 mmol/L (ref 135–145)
Total Bilirubin: 1.4 mg/dL — ABNORMAL HIGH (ref 0.3–1.2)
Total Protein: 6.8 g/dL (ref 6.5–8.1)

## 2019-02-28 LAB — GLUCOSE, CAPILLARY
Glucose-Capillary: 130 mg/dL — ABNORMAL HIGH (ref 70–99)
Glucose-Capillary: 140 mg/dL — ABNORMAL HIGH (ref 70–99)

## 2019-02-28 LAB — BILIRUBIN, DIRECT: Bilirubin, Direct: 0.2 mg/dL (ref 0.0–0.2)

## 2019-02-28 SURGERY — LAPAROSCOPIC CHOLECYSTECTOMY
Anesthesia: General | Site: Abdomen

## 2019-02-28 MED ORDER — OXYCODONE HCL 5 MG PO TABS: 5.0000 mg | ORAL_TABLET | Freq: Once | ORAL | Status: DC | PRN

## 2019-02-28 MED ORDER — ONDANSETRON HCL 4 MG/2ML IJ SOLN: 4.0000 mg | Freq: Once | INTRAMUSCULAR | Status: DC | PRN

## 2019-02-28 MED ORDER — KETOROLAC TROMETHAMINE 30 MG/ML IJ SOLN
INTRAMUSCULAR | Status: DC | PRN
  Administered 2019-02-28: 30 mg via INTRAVENOUS

## 2019-02-28 MED ORDER — DEXAMETHASONE SODIUM PHOSPHATE 10 MG/ML IJ SOLN
INTRAMUSCULAR | Status: AC
  Filled 2019-02-28: qty 1

## 2019-02-28 MED ORDER — DEXAMETHASONE SODIUM PHOSPHATE 10 MG/ML IJ SOLN
INTRAMUSCULAR | Status: DC | PRN
  Administered 2019-02-28: 8 mg via INTRAVENOUS

## 2019-02-28 MED ORDER — LIDOCAINE HCL (CARDIAC) PF 100 MG/5ML IV SOSY
PREFILLED_SYRINGE | INTRAVENOUS | Status: DC | PRN
  Administered 2019-02-28: 100 mg via INTRAVENOUS

## 2019-02-28 MED ORDER — FENTANYL CITRATE (PF) 100 MCG/2ML IJ SOLN
INTRAMUSCULAR | Status: DC | PRN
  Administered 2019-02-28 (×2): 50 ug via INTRAVENOUS

## 2019-02-28 MED ORDER — EPINEPHRINE PF 1 MG/ML IJ SOLN
INTRAMUSCULAR | Status: AC
  Filled 2019-02-28: qty 1

## 2019-02-28 MED ORDER — ROCURONIUM BROMIDE 50 MG/5ML IV SOLN
INTRAVENOUS | Status: AC
  Filled 2019-02-28: qty 1

## 2019-02-28 MED ORDER — FENTANYL CITRATE (PF) 100 MCG/2ML IJ SOLN
INTRAMUSCULAR | Status: AC
  Administered 2019-02-28: 25 ug via INTRAVENOUS
  Filled 2019-02-28: qty 2

## 2019-02-28 MED ORDER — HEPARIN SODIUM (PORCINE) 5000 UNIT/ML IJ SOLN
INTRAMUSCULAR | Status: AC
  Administered 2019-02-28: 5000 [IU] via SUBCUTANEOUS
  Filled 2019-02-28: qty 1

## 2019-02-28 MED ORDER — 0.9 % SODIUM CHLORIDE (POUR BTL) OPTIME
TOPICAL | Status: DC | PRN
  Administered 2019-02-28: 500 mL

## 2019-02-28 MED ORDER — OXYCODONE HCL 5 MG PO TABS
ORAL_TABLET | ORAL | Status: AC
  Administered 2019-02-28: 5 mg via ORAL
  Filled 2019-02-28: qty 1

## 2019-02-28 MED ORDER — PROPOFOL 10 MG/ML IV BOLUS
INTRAVENOUS | Status: DC | PRN
  Administered 2019-02-28: 200 mg via INTRAVENOUS

## 2019-02-28 MED ORDER — MIDAZOLAM HCL 2 MG/2ML IJ SOLN
INTRAMUSCULAR | Status: AC
  Filled 2019-02-28: qty 2

## 2019-02-28 MED ORDER — PHENYLEPHRINE HCL (PRESSORS) 10 MG/ML IV SOLN
INTRAVENOUS | Status: DC | PRN
  Administered 2019-02-28: 50 ug via INTRAVENOUS

## 2019-02-28 MED ORDER — SODIUM CHLORIDE 0.9 % IV SOLN: INTRAVENOUS | Status: AC

## 2019-02-28 MED ORDER — ROCURONIUM BROMIDE 100 MG/10ML IV SOLN
INTRAVENOUS | Status: DC | PRN
  Administered 2019-02-28: 40 mg via INTRAVENOUS

## 2019-02-28 MED ORDER — KETOROLAC TROMETHAMINE 30 MG/ML IJ SOLN: 30.0000 mg | Freq: Four times a day (QID) | INTRAMUSCULAR | Status: DC

## 2019-02-28 MED ORDER — KETOROLAC TROMETHAMINE 30 MG/ML IJ SOLN
INTRAMUSCULAR | Status: AC
  Filled 2019-02-28: qty 1

## 2019-02-28 MED ORDER — FENTANYL CITRATE (PF) 100 MCG/2ML IJ SOLN
INTRAMUSCULAR | Status: AC
  Filled 2019-02-28: qty 2

## 2019-02-28 MED ORDER — ONDANSETRON HCL 4 MG/2ML IJ SOLN
INTRAMUSCULAR | Status: AC
  Filled 2019-02-28: qty 2

## 2019-02-28 MED ORDER — ONDANSETRON HCL 4 MG/2ML IJ SOLN
INTRAMUSCULAR | Status: DC | PRN
  Administered 2019-02-28: 4 mg via INTRAVENOUS

## 2019-02-28 MED ORDER — KETOROLAC TROMETHAMINE 30 MG/ML IJ SOLN
INTRAMUSCULAR | Status: AC
  Administered 2019-02-28: 30 mg via INTRAVENOUS
  Filled 2019-02-28: qty 1

## 2019-02-28 MED ORDER — ACETAMINOPHEN 500 MG PO TABS: 1000.0000 mg | ORAL_TABLET | Freq: Four times a day (QID) | ORAL | Status: DC | PRN

## 2019-02-28 MED ORDER — MIDAZOLAM HCL 2 MG/2ML IJ SOLN
INTRAMUSCULAR | Status: DC | PRN
  Administered 2019-02-28: 2 mg via INTRAVENOUS

## 2019-02-28 MED ORDER — PROPOFOL 10 MG/ML IV BOLUS
INTRAVENOUS | Status: AC
  Filled 2019-02-28: qty 20

## 2019-02-28 MED ORDER — OXYCODONE HCL 5 MG/5ML PO SOLN: 5.0000 mg | Freq: Once | ORAL | Status: DC | PRN

## 2019-02-28 MED ORDER — CIPROFLOXACIN IN D5W 400 MG/200ML IV SOLN
INTRAVENOUS | Status: AC
  Filled 2019-02-28: qty 200

## 2019-02-28 MED ORDER — SUGAMMADEX SODIUM 200 MG/2ML IV SOLN
INTRAVENOUS | Status: AC
  Filled 2019-02-28: qty 2

## 2019-02-28 MED ORDER — EPHEDRINE SULFATE 50 MG/ML IJ SOLN
INTRAMUSCULAR | Status: DC | PRN
  Administered 2019-02-28: 5 mg via INTRAVENOUS

## 2019-02-28 MED ORDER — BUPIVACAINE-EPINEPHRINE (PF) 0.25% -1:200000 IJ SOLN
INTRAMUSCULAR | Status: DC | PRN
  Administered 2019-02-28: 30 mL

## 2019-02-28 MED ORDER — BUPIVACAINE HCL (PF) 0.25 % IJ SOLN
INTRAMUSCULAR | Status: AC
  Filled 2019-02-28: qty 30

## 2019-02-28 MED ORDER — OXYCODONE HCL 5 MG PO TABS: 5.0000 mg | ORAL_TABLET | ORAL | Status: DC | PRN

## 2019-02-28 MED ORDER — FENTANYL CITRATE (PF) 100 MCG/2ML IJ SOLN
25.0000 ug | INTRAMUSCULAR | Status: DC | PRN
  Administered 2019-02-28 (×2): 25 ug via INTRAVENOUS

## 2019-02-28 MED ORDER — LIDOCAINE HCL (PF) 2 % IJ SOLN
INTRAMUSCULAR | Status: AC
  Filled 2019-02-28: qty 5

## 2019-02-28 MED ORDER — ACETAMINOPHEN 10 MG/ML IV SOLN: 1000.0000 mg | Freq: Once | INTRAVENOUS | Status: DC | PRN

## 2019-02-28 SURGICAL SUPPLY — 47 items
APPLIER CLIP 5 13 M/L LIGAMAX5 (MISCELLANEOUS) ×3
BLADE SURG 15 STRL LF DISP TIS (BLADE) ×1 IMPLANT
BLADE SURG 15 STRL SS (BLADE) ×2
CANISTER SUCT 1200ML W/VALVE (MISCELLANEOUS) ×3 IMPLANT
CATH CHOLANGI 4FR 420404F (CATHETERS) IMPLANT
CHLORAPREP W/TINT 26 (MISCELLANEOUS) ×3 IMPLANT
CLIP APPLIE 5 13 M/L LIGAMAX5 (MISCELLANEOUS) ×1 IMPLANT
CONRAY 60ML FOR OR (MISCELLANEOUS) ×3 IMPLANT
COVER WAND RF STERILE (DRAPES) ×3 IMPLANT
DEFOGGER SCOPE WARMER CLEARIFY (MISCELLANEOUS) ×3 IMPLANT
DERMABOND ADVANCED (GAUZE/BANDAGES/DRESSINGS) ×2
DERMABOND ADVANCED .7 DNX12 (GAUZE/BANDAGES/DRESSINGS) ×1 IMPLANT
DRAPE C-ARM XRAY 36X54 (DRAPES) IMPLANT
ELECT CAUTERY BLADE TIP 2.5 (TIP) ×3
ELECT REM PT RETURN 9FT ADLT (ELECTROSURGICAL) ×3
ELECTRODE CAUTERY BLDE TIP 2.5 (TIP) ×1 IMPLANT
ELECTRODE REM PT RTRN 9FT ADLT (ELECTROSURGICAL) ×1 IMPLANT
GLOVE SURG SYN 7.0 (GLOVE) ×3 IMPLANT
GLOVE SURG SYN 7.5  E (GLOVE) ×2
GLOVE SURG SYN 7.5 E (GLOVE) ×1 IMPLANT
GOWN STRL REUS W/ TWL LRG LVL3 (GOWN DISPOSABLE) ×3 IMPLANT
GOWN STRL REUS W/TWL LRG LVL3 (GOWN DISPOSABLE) ×6
IRRIGATION STRYKERFLOW (MISCELLANEOUS) IMPLANT
IRRIGATOR STRYKERFLOW (MISCELLANEOUS)
IV CATH ANGIO 12GX3 LT BLUE (NEEDLE) ×3 IMPLANT
IV NS 1000ML (IV SOLUTION)
IV NS 1000ML BAXH (IV SOLUTION) IMPLANT
JACKSON PRATT 10 (INSTRUMENTS) IMPLANT
L-HOOK LAP DISP 36CM (ELECTROSURGICAL) ×3
LABEL OR SOLS (LABEL) ×3 IMPLANT
LHOOK LAP DISP 36CM (ELECTROSURGICAL) ×1 IMPLANT
NEEDLE HYPO 22GX1.5 SAFETY (NEEDLE) ×6 IMPLANT
PACK LAP CHOLECYSTECTOMY (MISCELLANEOUS) ×3 IMPLANT
PENCIL ELECTRO HAND CTR (MISCELLANEOUS) ×3 IMPLANT
POUCH SPECIMEN RETRIEVAL 10MM (ENDOMECHANICALS) ×3 IMPLANT
SCISSORS METZENBAUM CVD 33 (INSTRUMENTS) ×3 IMPLANT
SET TUBE SMOKE EVAC HIGH FLOW (TUBING) ×3 IMPLANT
SLEEVE ADV FIXATION 5X100MM (TROCAR) ×9 IMPLANT
SPONGE VERSALON 4X4 4PLY (MISCELLANEOUS) IMPLANT
SUT MNCRL 4-0 (SUTURE) ×4
SUT MNCRL 4-0 27XMFL (SUTURE) ×2
SUT VIC AB 3-0 SH 27 (SUTURE) ×2
SUT VIC AB 3-0 SH 27X BRD (SUTURE) ×1 IMPLANT
SUT VICRYL 0 AB UR-6 (SUTURE) ×3 IMPLANT
SUTURE MNCRL 4-0 27XMF (SUTURE) ×2 IMPLANT
TROCAR BALLN GELPORT 12X130M (ENDOMECHANICALS) ×3 IMPLANT
TROCAR Z-THREAD OPTICAL 5X100M (TROCAR) ×3 IMPLANT

## 2019-02-28 NOTE — Progress Notes (Signed)
[facility] SURGICAL PROGRESS NOTE  Hospital Day(s): 1.   Post op day(s): 1 Day Post-Op.   Interval History:  Patient seen and examined no acute events or new complaints overnight.  Patient reports her abdominal pain and nausea have improved No fever, chills.  No new complaints.  Plan for laparoscopic cholecystectomy this afternoon   Vital signs in last 24 hours: [min-max] current  Temp:  [97 F (36.1 C)-98.7 F (37.1 C)] 98.1 F (36.7 C) (01/14 0351) Pulse Rate:  [63-105] 67 (01/14 0351) Resp:  [12-19] 17 (01/13 2049) BP: (114-183)/(70-107) 124/75 (01/14 0351) SpO2:  [96 %-100 %] 96 % (01/14 0351) Weight:  [70.8 kg] 70.8 kg (01/14 0525)     Height: 5\' 7"  (170.2 cm) Weight: 70.8 kg BMI (Calculated): 24.44   Intake/Output last 2 shifts:  01/13 0701 - 01/14 0700 In: 500 [I.V.:500] Out: -    Physical Exam:  Constitutional: alert, cooperative and no distress  Respiratory: breathing non-labored at rest  Cardiovascular: regular rate and sinus rhythm  Gastrointestinal: soft, mild RUQ soreness, and non-distended Integumentary: warm, dry  Labs:  CBC Latest Ref Rng & Units 02/27/2019 02/26/2019 01/30/2019  WBC 4.0 - 10.5 K/uL 5.9 7.6 6.5  Hemoglobin 12.0 - 15.0 g/dL 10.5(L) 11.4(L) 12.2  Hematocrit 36.0 - 46.0 % 32.8(L) 34.7(L) 36.3  Platelets 150 - 400 K/uL 232 265 223   CMP Latest Ref Rng & Units 02/28/2019 02/27/2019 02/26/2019  Glucose 70 - 99 mg/dL 04/26/2019) 91 465(K)  BUN 6 - 20 mg/dL 14 11 354(S)  Creatinine 0.44 - 1.00 mg/dL 56(C 1.27 5.17  Sodium 135 - 145 mmol/L 137 139 136  Potassium 3.5 - 5.1 mmol/L 3.9 3.9 3.7  Chloride 98 - 111 mmol/L 107 107 103  CO2 22 - 32 mmol/L 24 26 24   Calcium 8.9 - 10.3 mg/dL 9.1 8.9 9.5  Total Protein 6.5 - 8.1 g/dL 6.8 7.0 8.0  Total Bilirubin 0.3 - 1.2 mg/dL 0.01) 1.3(H) 1.2  Alkaline Phos 38 - 126 U/L 58 54 67  AST 15 - 41 U/L 71(H) 104(H) 135(H)  ALT 0 - 44 U/L 123(H) 156(H) 204(H)     Imaging studies: No new  pertinent imaging studies   Assessment/Plan:  54 y.o. female with improvement in nausea/abdominal pain whose symptoms may represent gallbladder etiology   - Plan for laparoscopic cholecystectomy this afternoon with Dr 7.4(B pending OR/Anesthesia availability  - All risks, benefits, and alternatives to above procedure(s) were discussed with the patient and her family, all of their questions were answered to their expressed satisfaction, patient expresses she wishes to proceed, and informed consent was obtained.    - NPO + IVF  - ABx on call to OR   - pain control prn; antiemetics prn  - monitor abdominal examination  - further management per primary service   All of the above findings and recommendations were discussed with the patient, and the medical team, and all of patient's questions were answered to her expressed satisfaction.  -- 56, PA-C [facility] 02/28/2019, 7:26 AM [phone] M-F: 7am - 4pm

## 2019-02-28 NOTE — Anesthesia Procedure Notes (Signed)
Procedure Name: Intubation Date/Time: 02/28/2019 2:01 PM Performed by: Ginger Carne, CRNA Pre-anesthesia Checklist: Patient identified, Emergency Drugs available, Suction available, Patient being monitored and Timeout performed Patient Re-evaluated:Patient Re-evaluated prior to induction Oxygen Delivery Method: Circle system utilized Preoxygenation: Pre-oxygenation with 100% oxygen Induction Type: IV induction Ventilation: Mask ventilation without difficulty Laryngoscope Size: Miller and 2 Grade View: Grade I Tube type: Oral Tube size: 7.0 mm Number of attempts: 1 Airway Equipment and Method: Stylet and Oral airway Placement Confirmation: ETT inserted through vocal cords under direct vision,  positive ETCO2 and breath sounds checked- equal and bilateral Secured at: 20 cm Tube secured with: Tape Dental Injury: Teeth and Oropharynx as per pre-operative assessment

## 2019-02-28 NOTE — Anesthesia Postprocedure Evaluation (Signed)
Anesthesia Post Note  Patient: Annette Phillips  Procedure(s) Performed: ESOPHAGOGASTRODUODENOSCOPY (EGD) WITH PROPOFOL (N/A )  Patient location during evaluation: PACU Anesthesia Type: General Level of consciousness: awake and alert Pain management: pain level controlled Vital Signs Assessment: post-procedure vital signs reviewed and stable Respiratory status: spontaneous breathing, nonlabored ventilation, respiratory function stable and patient connected to nasal cannula oxygen Cardiovascular status: blood pressure returned to baseline and stable Postop Assessment: no apparent nausea or vomiting Anesthetic complications: no     Last Vitals:  Vitals:   02/28/19 0351 02/28/19 0750  BP: 124/75 128/73  Pulse: 67 78  Resp:  18  Temp: 36.7 C 36.6 C  SpO2: 96% 96%    Last Pain:  Vitals:   02/28/19 0750  TempSrc: Oral  PainSc: 2                  Corinda Gubler

## 2019-02-28 NOTE — Anesthesia Postprocedure Evaluation (Signed)
Anesthesia Post Note  Patient: Tanairi Cypert  Procedure(s) Performed: LAPAROSCOPIC CHOLECYSTECTOMY (N/A Abdomen)  Patient location during evaluation: PACU Anesthesia Type: General Level of consciousness: awake and alert Pain management: pain level controlled Vital Signs Assessment: post-procedure vital signs reviewed and stable Respiratory status: spontaneous breathing, nonlabored ventilation, respiratory function stable and patient connected to nasal cannula oxygen Cardiovascular status: blood pressure returned to baseline and stable Postop Assessment: no apparent nausea or vomiting Anesthetic complications: no     Last Vitals:  Vitals:   02/28/19 1614 02/28/19 1715  BP:  (!) 151/82  Pulse: 83 69  Resp: 15 16  Temp: 36.5 C 36.6 C  SpO2: 95% 99%    Last Pain:  Vitals:   02/28/19 1801  TempSrc:   PainSc: 2                  Yevette Edwards

## 2019-02-28 NOTE — Transfer of Care (Signed)
Immediate Anesthesia Transfer of Care Note  Patient: Annette Phillips  Procedure(s) Performed: LAPAROSCOPIC CHOLECYSTECTOMY (N/A Abdomen)  Patient Location: PACU  Anesthesia Type:General  Level of Consciousness: drowsy  Airway & Oxygen Therapy: Patient Spontanous Breathing and Patient connected to nasal cannula oxygen  Post-op Assessment: Report given to RN and Post -op Vital signs reviewed and stable  Post vital signs: Reviewed and stable  Last Vitals:  Vitals Value Taken Time  BP 144/88 02/28/19 1523  Temp    Pulse 86 02/28/19 1523  Resp 14 02/28/19 1523  SpO2 100 % 02/28/19 1523  Vitals shown include unvalidated device data.  Last Pain:  Vitals:   02/28/19 1326  TempSrc: Tympanic  PainSc:       Patients Stated Pain Goal: 0 (02/27/19 0057)  Complications: No apparent anesthesia complications

## 2019-02-28 NOTE — Op Note (Signed)
  Procedure Date:  02/28/2019  Pre-operative Diagnosis:  Gallbladder polyps  Post-operative Diagnosis:  Gallbladder polyps  Procedure:  Laparoscopic cholecystectomy  Surgeon:  Howie Ill, MD  Assistant:  Lindell Spar, PA-S  Anesthesia:  General endotracheal  Estimated Blood Loss:  5 ml  Specimens:  gallbladder  Complications:  None  Indications for Procedure:  This is a 54 y.o. female who presents with abdominal pain and workup revealing gallbladder polyps.  The benefits, complications, treatment options, and expected outcomes were discussed with the patient. The risks of bleeding, infection, recurrence of symptoms, failure to resolve symptoms, bile duct damage, bile duct leak, retained common bile duct stone, bowel injury, and need for further procedures were all discussed with the patient and she was willing to proceed.  Description of Procedure: The patient was correctly identified in the preoperative area and brought into the operating room.  The patient was placed supine with VTE prophylaxis in place.  Appropriate time-outs were performed.  Anesthesia was induced and the patient was intubated.  Appropriate antibiotics were infused.  The abdomen was prepped and draped in a sterile fashion. An infraumbilical incision was made. A cutdown technique was used to enter the abdominal cavity without injury, and a Hasson trocar was inserted.  Pneumoperitoneum was obtained with appropriate opening pressures.  A 5-mm port was placed in the subxiphoid area and two 5-mm ports were placed in the right upper quadrant under direct visualization.  The gallbladder was identified.  The fundus was grasped and retracted cephalad.  Adhesions were lysed bluntly and with electrocautery. The infundibulum was grasped and retracted laterally, exposing the peritoneum overlying the gallbladder.  This was incised with electrocautery and extended on either side of the gallbladder.  The cystic duct and cystic  artery were clearly identified and bluntly dissected.  Both were clipped twice proximally and once distally, cutting in between.  The gallbladder was taken from the gallbladder fossa in a retrograde fashion with electrocautery. The gallbladder was placed in an Endocatch bag. The liver bed was inspected and any bleeding was controlled with electrocautery. The right upper quadrant was then inspected again revealing intact clips, no bleeding, and no ductal injury.  The 5 mm ports were removed under direct visualization and the Hasson trocar was removed.  The Endocatch bag was brought out via the umbilical incision. The fascial opening was closed using 0 vicryl suture.  Local anesthetic was infused in all incisions and the incisions were closed with 4-0 Monocryl.  The wounds were cleaned and sealed with DermaBond.  The patient was emerged from anesthesia and extubated and brought to the recovery room for further management.  The patient tolerated the procedure well and all counts were correct at the end of the case.   Howie Ill, MD

## 2019-02-28 NOTE — Progress Notes (Signed)
PROGRESS NOTE    Annette Phillips  ZYS:[id number] DOB: Jul 27, 1965 DOA: 02/26/2019 PCP: Erasmo Downer, MD   Brief Narrative:  54 y.o. female with medical history significant of history of hypothyroidism, external hemorrhoids comes to the hospital with complaints of epigastric and right upper quadrant abdominal pain along with nausea.  Was found to have transaminitis, esophagitis with reactive lymphadenopathy and gallbladder polyp.  GI and general surgery were consulted.  GI planning of endoscopic evaluation.   Assessment & Plan:   Principal Problem:   Intractable nausea and vomiting Active Problems:   Hypothyroid   External hemorrhoid, bleeding   Transaminitis   Gallbladder polyp   Esophagitis  Intractable nausea and vomiting, multifactorial Esophagitis with lymphadenopathy, reactive? Gallbladder polyps Patient is to undergo laparoscopic cholecystectomy this afternoon as patient is still nauseous unable to tolerate p.o. this morning. -Status post EGD on 02/27/2019 -- negative for any ulcers or gastritis -Continue PPI IV twice daily, Carafate 3 times daily with meals -GI following-plans for endoscopic evaluation today. -Pain management -N.p.o. for now; diet per surgery  Transaminitis -Improving this morning  History of hypothyroidism --no acute issue -Continue Synthroid.   DVT prophylaxis: SQ hep, to be help prior to her procedure at GI discretion  Code Status: Full code Family Communication: None Disposition Plan: Maintain hospital stay as patient is still quite nauseous, she will require endoscopic evaluation daily and possibly laparoscopic cholecystectomy depending on her symptoms  Consultants:   GI  General surgery  Procedures:   None thus far  Antimicrobials:   None   Subjective: Still having feeling of nausea.  Describes her pain is still in epigastric region going towards the right closer to her back.  Review of Systems Otherwise negative except  as per HPI, including: General: Denies fever, chills, night sweats or unintended weight loss. Resp: Denies cough, wheezing, shortness of breath. Cardiac: Denies chest pain, palpitations, orthopnea, paroxysmal nocturnal dyspnea. GI: Denies, diarrhea or constipation GU: Denies dysuria, frequency, hesitancy or incontinence MS: Denies muscle aches, joint pain or swelling Neuro: Denies headache, neurologic deficits (focal weakness, numbness, tingling), abnormal gait Psych: Denies anxiety, depression, SI/HI/AVH Skin: Denies new rashes or lesions ID: Denies sick contacts, exotic exposures, travel  Objective: Vitals:   02/28/19 0351 02/28/19 0525 02/28/19 0750 02/28/19 1326  BP: 124/75  128/73 132/79  Pulse: 67  78 (!) 105  Resp:   18 18  Temp: 98.1 F (36.7 C)  97.8 F (36.6 C) 97.8 F (36.6 C)  TempSrc:   Oral Tympanic  SpO2: 96%  96% 97%  Weight:  70.8 kg    Height:        Intake/Output Summary (Last 24 hours) at 02/28/2019 1430 Last data filed at 02/28/2019 1405 Gross per 24 hour  Intake 210 ml  Output --  Net 210 ml   Filed Weights   02/26/19 1421 02/28/19 0525  Weight: 70.3 kg 70.8 kg    Examination:  General exam: Appears calm and comfortable  Respiratory system: Clear to auscultation. Respiratory effort normal. Cardiovascular system: S1 & S2 heard, RRR. No JVD, murmurs, rubs, gallops or clicks. No pedal edema. Gastrointestinal system: Abdomen is nondistended, soft.  Tenderness to deep palpation in the epigastric region no organomegaly or masses felt. Normal bowel sounds heard. Central nervous system: Alert and oriented. No focal neurological deficits. Extremities: Symmetric 5 x 5 power. Skin: No rashes, lesions or ulcers Psychiatry: Judgement and insight appear normal. Mood & affect appropriate.     Data Reviewed:   CBC: Recent  Labs  Lab 02/26/19 1431 02/27/19 0729  WBC 7.6 5.9  HGB 11.4* 10.5*  HCT 34.7* 32.8*  MCV 86.3 87.9  PLT 265 035   Basic  Metabolic Panel: Recent Labs  Lab 02/26/19 1431 02/27/19 0729 02/28/19 0538  NA 136 139 137  K 3.7 3.9 3.9  CL 103 107 107  CO2 24 26 24   GLUCOSE 118* 91 158*  BUN 21* 11 14  CREATININE 0.76 0.76 0.74  CALCIUM 9.5 8.9 9.1   GFR: Estimated Creatinine Clearance: 78.2 mL/min (by C-G formula based on SCr of 0.74 mg/dL). Liver Function Tests: Recent Labs  Lab 02/26/19 1431 02/27/19 0729 02/28/19 0538  AST 135* 104* 71*  ALT 204* 156* 123*  ALKPHOS 67 54 58  BILITOT 1.2 1.3* 1.4*  PROT 8.0 7.0 6.8  ALBUMIN 4.3 3.7 3.7   Recent Labs  Lab 02/26/19 0927 02/26/19 1431  LIPASE 48 37   No results for input(s): AMMONIA in the last 168 hours. Coagulation Profile: Recent Labs  Lab 02/27/19 0729  INR 1.1   Cardiac Enzymes: No results for input(s): CKTOTAL, CKMB, CKMBINDEX, TROPONINI in the last 168 hours. BNP (last 3 results) No results for input(s): PROBNP in the last 8760 hours. HbA1C: No results for input(s): HGBA1C in the last 72 hours. CBG: Recent Labs  Lab 02/27/19 0801 02/27/19 2350 02/28/19 0755  GLUCAP 78 128* 130*   Lipid Profile: No results for input(s): CHOL, HDL, LDLCALC, TRIG, CHOLHDL, LDLDIRECT in the last 72 hours. Thyroid Function Tests: Recent Labs    02/26/19 1431  TSH 0.027*   Anemia Panel: Recent Labs    02/27/19 0729  VITAMINB12 1,474*  FOLATE 36.0  FERRITIN 15  TIBC 419  IRON 34   Sepsis Labs: No results for input(s): PROCALCITON, LATICACIDVEN in the last 168 hours.  Recent Results (from the past 240 hour(s))  SARS CORONAVIRUS 2 (TAT 6-24 HRS) Nasopharyngeal Nasopharyngeal Swab     Status: None   Collection Time: 02/27/19  2:43 AM   Specimen: Nasopharyngeal Swab  Result Value Ref Range Status   SARS Coronavirus 2 NEGATIVE NEGATIVE Final    Comment: (NOTE) SARS-CoV-2 target nucleic acids are NOT DETECTED. The SARS-CoV-2 RNA is generally detectable in upper and lower respiratory specimens during the acute phase of infection.  Negative results do not preclude SARS-CoV-2 infection, do not rule out co-infections with other pathogens, and should not be used as the sole basis for treatment or other patient management decisions. Negative results must be combined with clinical observations, patient history, and epidemiological information. The expected result is Negative. Fact Sheet for Patients: SugarRoll.be Fact Sheet for Healthcare Providers: https://www.woods-mathews.com/ This test is not yet approved or cleared by the [place] FDA and  has been authorized for detection and/or diagnosis of SARS-CoV-2 by FDA under an Emergency Use Authorization (EUA). This EUA will remain  in effect (meaning this test can be used) for the duration of the COVID-19 declaration under Section 56 4(b)(1) of the Act, 21 U.S.C. section 360bbb-3(b)(1), unless the authorization is terminated or revoked sooner. Performed at [facility], [place] [address], [place], [place] [address]   Respiratory Panel by RT PCR (Flu A&B, Covid) - Nasopharyngeal Swab     Status: None   Collection Time: 02/27/19 11:15 AM   Specimen: Nasopharyngeal Swab  Result Value Ref Range Status   SARS Coronavirus 2 by RT PCR NEGATIVE NEGATIVE Final    Comment: (NOTE) SARS-CoV-2 target nucleic acids are NOT DETECTED. The SARS-CoV-2 RNA  is generally detectable in upper respiratoy specimens during the acute phase of infection. The lowest concentration of SARS-CoV-2 viral copies this assay can detect is 131 copies/mL. A negative result does not preclude SARS-Cov-2 infection and should not be used as the sole basis for treatment or other patient management decisions. A negative result may occur with  improper specimen collection/handling, submission of specimen other than nasopharyngeal swab, presence of viral mutation(s) within the areas targeted by this assay, and inadequate number of viral copies (<131 copies/mL). A  negative result must be combined with clinical observations, patient history, and epidemiological information. The expected result is Negative. Fact Sheet for Patients:  https://www.moore.com/ Fact Sheet for Healthcare Providers:  https://www.young.biz/ This test is not yet ap proved or cleared by the [place] FDA and  has been authorized for detection and/or diagnosis of SARS-CoV-2 by FDA under an Emergency Use Authorization (EUA). This EUA will remain  in effect (meaning this test can be used) for the duration of the COVID-19 declaration under Section 564(b)(1) of the Act, 21 U.S.C. section 360bbb-3(b)(1), unless the authorization is terminated or revoked sooner.    Influenza A by PCR NEGATIVE NEGATIVE Final   Influenza B by PCR NEGATIVE NEGATIVE Final    Comment: (NOTE) The Xpert Xpress SARS-CoV-2/FLU/RSV assay is intended as an aid in  the diagnosis of influenza from Nasopharyngeal swab specimens and  should not be used as a sole basis for treatment. Nasal washings and  aspirates are unacceptable for Xpert Xpress SARS-CoV-2/FLU/RSV  testing. Fact Sheet for Patients: https://www.moore.com/ Fact Sheet for Healthcare Providers: https://www.young.biz/ This test is not yet approved or cleared by the [place] FDA and  has been authorized for detection and/or diagnosis of SARS-CoV-2 by  FDA under an Emergency Use Authorization (EUA). This EUA will remain  in effect (meaning this test can be used) for the duration of the  Covid-19 declaration under Section 564(b)(1) of the Act, 21  U.S.C. section 360bbb-3(b)(1), unless the authorization is  terminated or revoked. Performed at [facility], [address], [place], [place] [address]          Radiology Studies: CT ABDOMEN PELVIS W CONTRAST  Result Date: 02/26/2019 CLINICAL DATA:  Right upper quadrant pain, no fever, no elevated  white blood cell count. EXAM: CT ABDOMEN AND PELVIS WITH CONTRAST TECHNIQUE: Multidetector CT imaging of the abdomen and pelvis was performed using the standard protocol following bolus administration of intravenous contrast. CONTRAST:  OMNIPAQUE IOHEXOL 300 MG/ML  SOLN COMPARISON:  Same day ultrasound, CT abdomen pelvis 05/15/2008 FINDINGS: Lower chest: Atelectatic changes in the lung bases. Lung bases otherwise clear. Normal heart size. No pericardial effusion. Hepatobiliary: No focal liver abnormality is seen. Multiple gallbladder polyps are better visualized on same day ultrasound. No gallstones, gallbladder wall thickening, or biliary dilatation. Pancreas: Unremarkable. No pancreatic ductal dilatation or surrounding inflammatory changes. Spleen: Normal in size without focal abnormality. Adrenals/Urinary Tract: Normal adrenal glands. Kidneys enhance and excrete symmetrically. No concerning renal lesions. No urolithiasis or hydronephrosis. Urinary bladder is unremarkable. Stomach/Bowel: Mild hazy stranding anterior to the GE junction with few reactive appearing lymph nodes. Minimal if any thickening in the visible mucosa though this is poorly assessed due to underdistention. Slight thickening is present at the gastric antrum as well though could reflect normal contraction. Duodenal sweep is unremarkable. No small bowel dilatation or wall thickening. The appendix is surgically absent. No colonic dilatation or wall thickening. Vascular/Lymphatic: Atherosclerotic plaque within the normal caliber aorta. Reactive adenopathy the  adjacent the GE junction/upper abdomen as above with a borderline enlarged node measuring 10 mm (2/18). Reproductive: Retroflexed uterus. No concerning adnexal lesions. Other: No free fluid. No free air. No bowel containing hernias. Musculoskeletal: Multilevel degenerative changes are present in the imaged portions of the spine. No acute osseous abnormality or suspicious osseous lesion.  IMPRESSION: 1. Mild hazy stranding anterior to the GE junction/upper abdomen with few reactive appearing lymph nodes. Minimal if any thickening in the visible mucosa though this is poorly assessed due to underdistention. Slight thickening is present at the gastric antrum as well though this is poorly assessed due to underdistention. In the setting upper abdominal pain, could reflect esophagitis or gastritis. Recommend correlation with upper endoscopy. 2. Multiple gallbladder polyps are better visualized on same day ultrasound. 3.  Aortic Atherosclerosis (ICD10-I70.0). 4. Retroflexed uterus. Electronically Signed   By: Kreg Shropshire M.D.   On: 02/26/2019 16:06   US Abdomen Limited RUQ  Result Date: 02/26/2019 CLINICAL DATA:  Nausea.  Elevated liver enzymes EXAM: ULTRASOUND ABDOMEN LIMITED RIGHT UPPER QUADRANT COMPARISON:  CT abdomen and pelvis May 15, 2008 FINDINGS: Gallbladder: No within the gallbladder, there are multiple echogenic foci which neither move nor shadow consistent with cholelithiasis. There are two polyps, each measuring 6 mm in length. A 5 mm polyp is also noted. Other polyps seen measure 2 mm and 3 mm respectively. A questionable 6 polyp is present measuring 2 mm. There are no echogenic foci in the gallbladder which move and shadow as is expected with gallstones. No gallbladder wall thickening or pericholecystic fluid. No sonographic Murphy sign noted by sonographer. Common bile duct: Diameter: 3 mm. No intrahepatic or extrahepatic biliary duct dilatation. Liver: No focal lesion identified. Within normal limits in parenchymal echogenicity. Portal vein is patent on color Doppler imaging with normal direction of blood flow towards the liver. Other: None. IMPRESSION: Multiple apparent polyps in the gallbladder, largest measuring 6 mm in length. Per consensus guidelines, polyps in the size range present on this study do not warrant additional imaging surveillance. No evident gallstones, gallbladder  wall thickening, or pericholecystic fluid. Study otherwise unremarkable. Electronically Signed   By: Bretta Bang III M.D.   On: 02/26/2019 15:20        Scheduled Meds: . [MAR Hold] levothyroxine  100 mcg Oral Q0600  . [MAR Hold] pantoprazole (PROTONIX) IV  40 mg Intravenous Q12H  . [MAR Hold] sucralfate  1 g Oral TID WC & HS   Continuous Infusions: . sodium chloride 50 mL/hr at 02/28/19 1322  . acetaminophen       LOS: 1 day   Time spent= 35 mins    Thomasenia Bottoms, MD [facility]  If 7PM-7AM, please contact night-coverage  02/28/2019, 2:30 PM

## 2019-03-01 LAB — GLUCOSE, CAPILLARY: Glucose-Capillary: 103 mg/dL — ABNORMAL HIGH (ref 70–99)

## 2019-03-01 MED ORDER — PSYLLIUM 95 % PO PACK: 1.0000 | PACK | Freq: Every day | ORAL | 0 refills | Status: DC

## 2019-03-01 MED ORDER — KETOROLAC TROMETHAMINE 30 MG/ML IJ SOLN
INTRAMUSCULAR | Status: AC
  Administered 2019-03-01: 30 mg via INTRAVENOUS
  Filled 2019-03-01: qty 1

## 2019-03-01 MED ORDER — SENNOSIDES-DOCUSATE SODIUM 8.6-50 MG PO TABS: 1.0000 | ORAL_TABLET | Freq: Every evening | ORAL | 0 refills | Status: DC | PRN

## 2019-03-01 MED ORDER — IBUPROFEN 800 MG PO TABS: 800.0000 mg | ORAL_TABLET | Freq: Three times a day (TID) | ORAL | 0 refills | Status: DC | PRN

## 2019-03-01 MED ORDER — OXYCODONE HCL 5 MG PO TABS: 5.0000 mg | ORAL_TABLET | ORAL | 0 refills | Status: DC | PRN

## 2019-03-01 MED ORDER — ONDANSETRON 4 MG PO TBDP: 4.0000 mg | ORAL_TABLET | Freq: Three times a day (TID) | ORAL | 0 refills | Status: DC | PRN

## 2019-03-01 MED ORDER — ACETAMINOPHEN 325 MG PO TABS: 650.0000 mg | ORAL_TABLET | Freq: Four times a day (QID) | ORAL | 0 refills | Status: DC | PRN

## 2019-03-01 MED ORDER — PANTOPRAZOLE SODIUM 40 MG PO TBEC: 40.0000 mg | DELAYED_RELEASE_TABLET | Freq: Every day | ORAL | 0 refills | Status: DC

## 2019-03-01 NOTE — Progress Notes (Signed)
Patient discharge via wheelchair. Discharge instructions given to patient and her daughter. VSS no complaints of pain currently.

## 2019-03-01 NOTE — Discharge Instructions (Signed)
In addition to included general post-operative instructions for laparoscopic cholecystectomy,  Diet: Resume home heart healthy diet.   Activity: No heavy lifting >20 pounds (children, pets, laundry, garbage) for 4 weeks, but light activity and walking are encouraged. Do not drive or drink alcohol if taking narcotic pain medications or having pain that might distract from driving.  Wound care: 2 days after surgery (03/02/2019), you may shower/get incision wet with soapy water and pat dry (do not rub incisions), but no baths or submerging incision underwater until follow-up.   Medications: Resume all home medications. For mild to moderate pain: acetaminophen (Tylenol) or ibuprofen/naproxen (if no kidney disease). Combining Tylenol with alcohol can substantially increase your risk of causing liver disease. Narcotic pain medications, if prescribed, can be used for severe pain, though may cause nausea, constipation, and drowsiness. Do not combine Tylenol and Percocet (or similar) within a 6 hour period as Percocet (and similar) contain(s) Tylenol. If you do not need the narcotic pain medication, you do not need to fill the prescription.  Call office [phone] / [phone]) at any time if any questions, worsening pain, fevers/chills, bleeding, drainage from incision site, or other concerns.

## 2019-03-01 NOTE — Discharge Summary (Signed)
Physician Discharge Summary  Patient ID: Annette Phillips MRN: [id number] DOB/AGE: Jul 20, 1965 54 y.o.  Admit date: 02/26/2019 Discharge date: 03/01/2019  Admission Diagnoses: Intractable nausea and vomiting  Discharge Diagnoses:  Principal Problem:   Intractable nausea and vomiting Active Problems:   Hypothyroid   External hemorrhoid, bleeding   Transaminitis   Gallbladder polyp   Esophagitis   Discharged Condition: fair  Hospital Course: Brief Narrative:  54 y.o.femalewith medical history significant ofhistory of hypothyroidism, external hemorrhoids comes to the hospital with complaints of epigastric and right upper quadrant abdominal pain along with nausea.  Was found to have transaminitis, esophagitis with reactive lymphadenopathy and gallbladder polyp.  GI and general surgery were consulted.   Intractable nausea and vomiting, multifactorial Esophagitis with lymphadenopathy Gallbladder polyps Status post laparoscopic cholecystectomy on 02/28/2019  -Tolerated the entire procedure well -Pain significantly improved.  GI vomiting resolved -Status post EGD on 02/27/2019 -- negative for any ulcers or gastritis -Was on PPI IV twice daily; changed to oral once daily on discharge -Adequate pain management provided -Patient tolerated clear liquid diet and advance diet to solid diet which she tolerated as well -Passed gas was able to ambulate on her own maintain saturation on room air -Patient is to follow-up with surgery in 2 weeks, appointments given to patient -Also follow-up with PCP recommended  Transaminitis -Improved significantly -May follow-up outpatient PCP  History of hypothyroidism --no acute issue -Continue Synthroid.  Consults: GI and Surgery  Significant Diagnostic Studies: Radiologic imaging, EGD and blood work  Treatments: As per hospital course and discharge med list  Discharge Exam: Blood pressure 131/79, pulse 73, temperature (!) 96.9 F (36.1 C),  temperature source Tympanic, resp. rate 16, height 5\' 7"  (1.702 m), weight 70.8 kg, SpO2 97 %.  General exam: Appears calm and comfortable  Respiratory system: Clear to auscultation. Respiratory effort normal. Cardiovascular system: S1 & S2 heard, RRR. No JVD, murmurs, rubs, gallops or clicks. No pedal edema. Gastrointestinal system: Abdomen is nondistended, soft.    Positive bowel sound, no rebound tenderness or guarding  -Surgical site appears dry and clean and gauzes on Central nervous system: Alert and oriented. No focal neurological deficits. Extremities: Symmetric 5 x 5 power. Skin: No rashes, lesions or ulcers Psychiatry: Judgement and insight appear normal. Mood & affect appropriate.   Disposition: Discharge disposition: 01-Home or Self Care     Stable to be discharged home with close follow-up with PCP and surgery.  And solid diet well. Discharge Instructions    Call MD for:  persistant nausea and vomiting   Complete by: As directed    Call MD for:  redness, tenderness, or signs of infection (pain, swelling, redness, odor or green/yellow discharge around incision site)   Complete by: As directed    Call MD for:  temperature >100.4   Complete by: As directed    Diet - low sodium heart healthy   Complete by: As directed    Small meal at a time and more fluid intake encouraged   Increase activity slowly   Complete by: As directed      Allergies as of 03/01/2019      Reactions   Aspirin Other (See Comments)   Penicillin G Other (See Comments)      Medication List    TAKE these medications   acetaminophen 325 MG tablet Commonly known as: Tylenol Take 2 tablets (650 mg total) by mouth every 6 (six) hours as needed.   hydrocortisone-pramoxine rectal foam Commonly known as: PROCTOFOAM-HC Place 1  applicator rectally 2 (two) times daily.   ibuprofen 800 MG tablet Commonly known as: ADVIL Take 1 tablet (800 mg total) by mouth every 8 (eight) hours as needed.    Multi-Vitamins Tabs Take 1 tablet by mouth daily.   ondansetron 4 MG disintegrating tablet Commonly known as: Zofran ODT Take 1 tablet (4 mg total) by mouth every 8 (eight) hours as needed for nausea or vomiting.   oxyCODONE 5 MG immediate release tablet Commonly known as: Oxy IR/ROXICODONE Take 1 tablet (5 mg total) by mouth every 4 (four) hours as needed for moderate pain, severe pain or breakthrough pain.   pantoprazole 40 MG tablet Commonly known as: Protonix Take 1 tablet (40 mg total) by mouth daily.   psyllium 95 % Pack Commonly known as: HYDROCIL/METAMUCIL Take 1 packet by mouth daily.   senna-docusate 8.6-50 MG tablet Commonly known as: Senokot-S Take 1 tablet by mouth at bedtime as needed for mild constipation.   Synthroid 100 MCG tablet Generic drug: levothyroxine TAKE 1 TABLET BY MOUTH EVERY DAY (NEEDS OFFICE VISIT FOR FUTURE REFILL) What changed: See the new instructions.      Follow-up Information    Henrene Dodge, MD. Go on 03/15/2019.   Specialty: General Surgery Why: s/p lap chole  10am  Contact information: [address] [place] [address] [phone]        Erasmo Downer, MD. Schedule an appointment as soon as possible for a visit in 2 week(s).   Specialty: Family Medicine Why: follow up with Primary Care Doctor in 2 weeks.  Contact information: [address] [place] [address] [phone]           Signed: Thomasenia Bottoms 03/01/2019, 5:39 PM

## 2019-03-01 NOTE — Progress Notes (Addendum)
[facility] SURGICAL PROGRESS NOTE  Hospital Day(s): 2.   Post op day(s): 1 Day Post-Op.   Interval History:  Patient seen and examined no acute events or new complaints overnight.  Patient reports she is "feeling much better" Mild incisional soreness and discomfort in right shoulder.  No fever, chills, nausea, or emesis Tolerating diet No additional complaints.    Vital signs in last 24 hours: [min-max] current  Temp:  [96.9 F (36.1 C)-97.8 F (36.6 C)] 97.6 F (36.4 C) (01/15 0400) Pulse Rate:  [66-105] 66 (01/15 0400) Resp:  [12-18] 16 (01/14 1715) BP: (113-151)/(73-88) 113/74 (01/15 0400) SpO2:  [95 %-100 %] 97 % (01/15 0400) Weight:  [70.8 kg] 70.8 kg (01/15 0615)     Height: 5\' 7"  (170.2 cm) Weight: 70.8 kg BMI (Calculated): 24.44   Intake/Output last 2 shifts:  01/14 0701 - 01/15 0700 In: 2833.6 [P.O.:900; I.V.:1183.6; IV Piggyback:750] Out: 5 [Blood:5]   Physical Exam:  Constitutional: alert, cooperative and no distress  Respiratory: breathing non-labored at rest  Cardiovascular: regular rate and sinus rhythm  Gastrointestinal: soft, expected incisional soreness, and non-distended, no rebound/guarding Integumentary: Laparoscopic incisions are CDI with dermabond, no erythema or drainage   Labs:  CBC Latest Ref Rng & Units 02/27/2019 02/26/2019 01/30/2019  WBC 4.0 - 10.5 K/uL 5.9 7.6 6.5  Hemoglobin 12.0 - 15.0 g/dL 10.5(L) 11.4(L) 12.2  Hematocrit 36.0 - 46.0 % 32.8(L) 34.7(L) 36.3  Platelets 150 - 400 K/uL 232 265 223   CMP Latest Ref Rng & Units 02/28/2019 02/27/2019 02/26/2019  Glucose 70 - 99 mg/dL 04/26/2019) 91 664(Q)  BUN 6 - 20 mg/dL 14 11 034(V)  Creatinine 0.44 - 1.00 mg/dL 42(V 9.56 3.87  Sodium 135 - 145 mmol/L 137 139 136  Potassium 3.5 - 5.1 mmol/L 3.9 3.9 3.7  Chloride 98 - 111 mmol/L 107 107 103  CO2 22 - 32 mmol/L 24 26 24   Calcium 8.9 - 10.3 mg/dL 9.1 8.9 9.5  Total Protein 6.5 - 8.1 g/dL 6.8 7.0 8.0  Total Bilirubin 0.3 - 1.2  mg/dL 5.64) 1.3(H) 1.2  Alkaline Phos 38 - 126 U/L 58 54 67  AST 15 - 41 U/L 71(H) 104(H) 135(H)  ALT 0 - 44 U/L 123(H) 156(H) 204(H)     Imaging studies: No new pertinent imaging studies   Assessment/Plan:  54 y.o. female with improvement in symptoms 1 Day Post-Op s/p laparoscopic cholecystectomy for gallbladder polyps and biliary symptoms.   - Continue diet as toelrated   - pain control prn  - monitor abdominal examination   - further management per primary service    - Discharge Planning; Okay for discharge from surgical standpoint, reviewed post-op instructions; follow up with surgery in 2 weeks; pain control Rx completed  All of the above findings and recommendations were discussed with the patient, and the medical team, and all of patient's questions were answered to her expressed satisfaction.  -- 3.3(I, PA-C [facility] 03/01/2019, 7:31 AM [phone] M-F: 7am - 4pm

## 2019-03-01 NOTE — OR Nursing (Signed)
FSBS not done because patient is not NPO anymore.   Taking fluids well and will be ordering regular food for breakfast.

## 2019-03-04 ENCOUNTER — Telehealth: Payer: Self-pay

## 2019-03-04 LAB — SURGICAL PATHOLOGY

## 2019-03-04 NOTE — Telephone Encounter (Signed)
No TCM completed, patient does not qualify for TCM services due to insurance coverage. HFU scheduled 03/15/19 @ 2:40 PM. Lorain Childes!

## 2019-03-08 ENCOUNTER — Ambulatory Visit: Payer: BC Managed Care – PPO

## 2019-03-15 ENCOUNTER — Ambulatory Visit: Payer: BC Managed Care – PPO | Admitting: Family Medicine

## 2019-03-15 ENCOUNTER — Other Ambulatory Visit: Payer: Self-pay

## 2019-03-15 ENCOUNTER — Encounter: Payer: Self-pay | Admitting: Family Medicine

## 2019-03-15 ENCOUNTER — Ambulatory Visit (INDEPENDENT_AMBULATORY_CARE_PROVIDER_SITE_OTHER): Payer: Self-pay | Admitting: Cardiothoracic Surgery

## 2019-03-15 ENCOUNTER — Encounter: Payer: Self-pay | Admitting: Emergency Medicine

## 2019-03-15 ENCOUNTER — Encounter: Payer: Self-pay | Admitting: Cardiothoracic Surgery

## 2019-03-15 VITALS — BP 133/86 | HR 98 | Temp 97.9°F | Resp 12 | Ht 67.0 in | Wt 152.0 lb

## 2019-03-15 VITALS — BP 124/80 | HR 92 | Temp 97.1°F | Wt 153.0 lb

## 2019-03-15 DIAGNOSIS — R7401 Elevation of levels of liver transaminase levels: Secondary | ICD-10-CM | POA: Diagnosis not present

## 2019-03-15 DIAGNOSIS — Z9049 Acquired absence of other specified parts of digestive tract: Secondary | ICD-10-CM | POA: Diagnosis not present

## 2019-03-15 DIAGNOSIS — K810 Acute cholecystitis: Secondary | ICD-10-CM

## 2019-03-15 NOTE — Progress Notes (Signed)
[name] returns today in follow-up.  She is 2 weeks out from her laparoscopic cholecystectomy for obstructing polyps in her gallbladder.  She states that she has done very well postop.  She is has no complaints.  She has had no right upper quadrant pain.  She is able to eat normally without any nausea or vomiting.  She has had no constipation or diarrhea.  On exam all of her wounds are healing as expected.  There is no erythema or drainage.  She has normal bowel sounds.  There is no sign of hernia.  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  She had all of her questions answered.  Return visit was not made.

## 2019-03-15 NOTE — Patient Instructions (Addendum)
Please call the office if you have any questions or concerns.   GENERAL POST-OPERATIVE PATIENT INSTRUCTIONS   WOUND CARE INSTRUCTIONS:  Keep a dry clean dressing on the wound if there is drainage. The initial bandage may be removed after 24 hours.  Once the wound has quit draining you may leave it open to air.  If clothing rubs against the wound or causes irritation and the wound is not draining you may cover it with a dry dressing during the daytime.  Try to keep the wound dry and avoid ointments on the wound unless directed to do so.  If the wound becomes bright red and painful or starts to drain infected material that is not clear, please contact your physician immediately.  If the wound is mildly pink and has a thick firm ridge underneath it, this is normal, and is referred to as a healing ridge.  This will resolve over the next 4-6 weeks.  BATHING: You may shower if you have been informed of this by your surgeon. However, Please do not submerge in a tub, hot tub, or pool until incisions are completely sealed or have been told by your surgeon that you may do so.  DIET:  You may eat any foods that you can tolerate.  It is a good idea to eat a high fiber diet and take in plenty of fluids to prevent constipation.  If you do become constipated you may want to take a mild laxative or take ducolax tablets on a daily basis until your bowel habits are regular.  Constipation can be very uncomfortable, along with straining, after recent surgery.  ACTIVITY:  You are encouraged to cough and deep breath or use your incentive spirometer if you were given one, every 15-30 minutes when awake.  This will help prevent respiratory complications and low grade fevers post-operatively if you had a general anesthetic.  You may want to hug a pillow when coughing and sneezing to add additional support to the surgical area, if you had abdominal or chest surgery, which will decrease pain during these times.  You are encouraged  to walk and engage in light activity for the next two weeks.  You should not lift more than 20 pounds, until 04/11/2019 as it could put you at increased risk for complications.  Twenty pounds is roughly equivalent to a plastic bag of groceries. At that time- Listen to your body when lifting, if you have pain when lifting, stop and then try again in a few days. Soreness after doing exercises or activities of daily living is normal as you get back in to your normal routine.  MEDICATIONS:  Try to take narcotic medications and anti-inflammatory medications, such as tylenol, ibuprofen, naprosyn, etc., with food.  This will minimize stomach upset from the medication.  Should you develop nausea and vomiting from the pain medication, or develop a rash, please discontinue the medication and contact your physician.  You should not drive, make important decisions, or operate machinery when taking narcotic pain medication.  SUNBLOCK Use sun block to incision area over the next year if this area will be exposed to sun. This helps decrease scarring and will allow you avoid a permanent darkened area over your incision.  QUESTIONS:  Please feel free to call our office if you have any questions, and we will be glad to assist you.

## 2019-03-15 NOTE — Progress Notes (Signed)
Patient: Annette Phillips Female    DOB: [birth year]   54 y.o.   MRN: [id number] Visit Date: 03/15/2019  Today's Provider: Lavon Paganini, MD   Chief Complaint  Patient presents with  . Hospitalization Follow-up   Subjective:     HPI   Follow up Hospitalization  Patient was admitted to [facility] on 02/27/2019 and discharged on 03/01/2019. She was treated for epigastric and RUQ abd pain, found to be secondary to chronic cholecystitis with gallbladder polyps, but no cholelithiasis. Treatment for this included EGD that showed no ulcers or esophagitis she was treated with IV PPI during hospitalization and sent home with 1 month supply of oral PPI she also underwent laparoscopic cholecystectomy.  Her pain has resolved after cholecystectomy.  She is followed up with her surgeon who has cleared her to return to work.  Her abdominal pain and nausea have resolved.. Telephone follow up was done on 03/04/2019 She reports excellent compliance with treatment. She reports this condition is Improved.  ------------------------------------------------------------------------------------      Allergies  Allergen Reactions  . Aspirin Other (See Comments)  . Penicillin G Other (See Comments)     Current Outpatient Medications:  .  docusate sodium (COLACE) 100 MG capsule, Take 100 mg by mouth 2 (two) times daily., Disp: , Rfl:  .  ibuprofen (ADVIL) 800 MG tablet, Take 1 tablet (800 mg total) by mouth every 8 (eight) hours as needed., Disp: 30 tablet, Rfl: 0 .  Multiple Vitamin (MULTI-VITAMINS) TABS, Take 1 tablet by mouth daily. , Disp: , Rfl:  .  pantoprazole (PROTONIX) 40 MG tablet, Take 1 tablet (40 mg total) by mouth daily., Disp: 30 tablet, Rfl: 0 .  SYNTHROID 100 MCG tablet, TAKE 1 TABLET BY MOUTH EVERY DAY (NEEDS OFFICE VISIT FOR FUTURE REFILL) (Patient taking differently: Take 100 mcg by mouth every evening. ), Disp: 90 tablet, Rfl: 0 .  hydrocortisone-pramoxine (PROCTOFOAM-HC)  rectal foam, Place 1 applicator rectally 2 (two) times daily. (Patient not taking: Reported on 02/26/2019), Disp: 10 g, Rfl: 3 .  psyllium (HYDROCIL/METAMUCIL) 95 % PACK, Take 1 packet by mouth daily., Disp: 240 each, Rfl: 0 .  senna-docusate (SENOKOT-S) 8.6-50 MG tablet, Take 1 tablet by mouth at bedtime as needed for mild constipation., Disp: 30 tablet, Rfl: 0  Review of Systems  Constitutional: Negative.   Respiratory: Negative.   Cardiovascular: Negative.   Gastrointestinal: Negative.   Genitourinary: Negative.   Skin: Negative.     Social History   Tobacco Use  . Smoking status: Never Smoker  . Smokeless tobacco: Never Used  Substance Use Topics  . Alcohol use: Yes    Alcohol/week: 0.0 standard drinks    Comment: rare      Objective:   BP 124/80 (BP Location: Left Arm, Patient Position: Sitting, Cuff Size: Large)   Pulse 92   Temp (!) 97.1 F (36.2 C) (Temporal)   Wt 153 lb (69.4 kg)   SpO2 99%   BMI 23.96 kg/m  Vitals:   03/15/19 1439  BP: 124/80  Pulse: 92  Temp: (!) 97.1 F (36.2 C)  TempSrc: Temporal  SpO2: 99%  Weight: 153 lb (69.4 kg)  Body mass index is 23.96 kg/m.   Physical Exam Vitals reviewed.  Constitutional:      General: She is not in acute distress.    Appearance: Normal appearance. She is well-developed. She is not diaphoretic.  HENT:     Head: Normocephalic and atraumatic.  Eyes:  General: No scleral icterus.    Conjunctiva/sclera: Conjunctivae normal.  Neck:     Thyroid: No thyromegaly.  Cardiovascular:     Rate and Rhythm: Normal rate and regular rhythm.     Pulses: Normal pulses.     Heart sounds: Normal heart sounds. No murmur.  Pulmonary:     Effort: Pulmonary effort is normal. No respiratory distress.     Breath sounds: Normal breath sounds. No wheezing, rhonchi or rales.  Abdominal:     General: There is no distension.     Palpations: Abdomen is soft.     Tenderness: There is no abdominal tenderness.     Comments:  Surgical incisions are clean, dry, intact  Musculoskeletal:     Cervical back: Neck supple.     Right lower leg: No edema.     Left lower leg: No edema.  Lymphadenopathy:     Cervical: No cervical adenopathy.  Skin:    General: Skin is warm and dry.     Capillary Refill: Capillary refill takes less than 2 seconds.     Findings: No rash.  Neurological:     Mental Status: She is alert and oriented to person, place, and time. Mental status is at baseline.  Psychiatric:        Mood and Affect: Mood normal.        Behavior: Behavior normal.      No results found for any visits on 03/15/19.     Assessment & Plan    1. Transaminitis 2. Status post laparoscopic cholecystectomy -Transaminitis seems to be related to chronic cholecystitis -Transaminitis was improving prior to discharge -She did have mild anemia during hospitalization, likely related to her surgery and procedures -We will recheck CBC and CMP today to ensure things are normalizing -She is doing well after her surgery and hospitalization and all symptoms have resolved -We will continue Protonix for a few more weeks, but she likely does not need this long-term -Continue gentle gallbladder diet -Discussed possibility of diarrhea after cholecystectomy and return precautions - Comprehensive metabolic panel - CBC  Other orders - docusate sodium (COLACE) 100 MG capsule; Take 100 mg by mouth 2 (two) times daily.    Return in about 5 months (around 08/13/2019) for chronic disease f/u, as scheduled.   The entirety of the information documented in the History of Present Illness, Review of Systems and Physical Exam were personally obtained by me. Portions of this information were initially documented by Kavin Leech, CMA and reviewed by me for thoroughness and accuracy.    Analea Muller, Marzella Schlein, MD MPH [facility]

## 2019-03-16 LAB — COMPREHENSIVE METABOLIC PANEL
ALT: 43 IU/L — ABNORMAL HIGH (ref 0–32)
AST: 32 IU/L (ref 0–40)
Albumin/Globulin Ratio: 1.6 (ref 1.2–2.2)
Albumin: 4.3 g/dL (ref 3.8–4.9)
Alkaline Phosphatase: 64 IU/L (ref 39–117)
BUN/Creatinine Ratio: 21 (ref 9–23)
BUN: 14 mg/dL (ref 6–24)
Bilirubin Total: 0.4 mg/dL (ref 0.0–1.2)
CO2: 24 mmol/L (ref 20–29)
Calcium: 9.6 mg/dL (ref 8.7–10.2)
Chloride: 102 mmol/L (ref 96–106)
Creatinine, Ser: 0.67 mg/dL (ref 0.57–1.00)
GFR calc Af Amer: 115 mL/min/{1.73_m2} (ref >59)
GFR calc non Af Amer: 100 mL/min/{1.73_m2} (ref >59)
Globulin, Total: 2.7 g/dL (ref 1.5–4.5)
Glucose: 94 mg/dL (ref 65–99)
Potassium: 4.1 mmol/L (ref 3.5–5.2)
Sodium: 137 mmol/L (ref 134–144)
Total Protein: 7 g/dL (ref 6.0–8.5)

## 2019-03-16 LAB — CBC
Hematocrit: 35 % (ref 34.0–46.6)
Hemoglobin: 11.5 g/dL (ref 11.1–15.9)
MCH: 28.7 pg (ref 26.6–33.0)
MCHC: 32.9 g/dL (ref 31.5–35.7)
MCV: 87 fL (ref 79–97)
Platelets: 260 10*3/uL (ref 150–450)
RBC: 4.01 x10E6/uL (ref 3.77–5.28)
RDW: 14.2 % (ref 11.7–15.4)
WBC: 6 10*3/uL (ref 3.4–10.8)

## 2019-03-18 ENCOUNTER — Telehealth: Payer: Self-pay

## 2019-03-18 NOTE — Telephone Encounter (Signed)
   Result Notes and Comments to Patient Comment seen by patient Annette Phillips on 03/18/2019 11:31 AM EST

## 2019-03-18 NOTE — Telephone Encounter (Signed)
-----   Message from Erasmo Downer, MD sent at 03/18/2019 11:27 AM EST ----- Normal labs. Liver function is almost back to totally normal

## 2019-04-01 ENCOUNTER — Ambulatory Visit (INDEPENDENT_AMBULATORY_CARE_PROVIDER_SITE_OTHER): Payer: Self-pay | Admitting: Surgery

## 2019-04-01 ENCOUNTER — Other Ambulatory Visit: Payer: Self-pay

## 2019-04-01 ENCOUNTER — Encounter: Payer: Self-pay | Admitting: Surgery

## 2019-04-01 VITALS — BP 134/88 | HR 91 | Temp 97.7°F | Resp 12 | Ht 67.0 in | Wt 152.0 lb

## 2019-04-01 DIAGNOSIS — Z09 Encounter for follow-up examination after completed treatment for conditions other than malignant neoplasm: Secondary | ICD-10-CM

## 2019-04-01 DIAGNOSIS — T8149XA Infection following a procedure, other surgical site, initial encounter: Secondary | ICD-10-CM

## 2019-04-01 MED ORDER — NYSTATIN 100000 UNIT/GM EX POWD: 1.0000 "application " | Freq: Three times a day (TID) | CUTANEOUS | 0 refills | Status: DC

## 2019-04-01 MED ORDER — SULFAMETHOXAZOLE-TRIMETHOPRIM 800-160 MG PO TABS: 1.0000 | ORAL_TABLET | Freq: Two times a day (BID) | ORAL | 0 refills | Status: DC

## 2019-04-01 NOTE — Progress Notes (Signed)
04/01/2019  HPI: Annette Phillips is a 54 y.o. female s/p laparoscopic cholecystectomy on 02/28/19.  He was seen by Dr. Thelma Barge on post-op check on 1/29 at which time she was doing well.  However, she called the office today because she has gotten a rash over two of the incisions.  She denies any drainage or tenderness.    Vital signs: BP 134/88   Pulse 91   Temp 97.7 F (36.5 C)   Resp 12   Ht 5\' 7"  (1.702 m)   Wt 152 lb (68.9 kg)   SpO2 99%   BMI 23.81 kg/m    Physical Exam: Constitutional: No acute distress Abdomen:  Soft, non-distended, non-tender to palpation.  The umbilical incision has some erythema at the edges with mild induration at the edges as well, with a macular rash on either side of the incision.  The right mid-clavicular incision also has the same characteristics.  The other two incisions are clean, intact, without any evidence of infection.  Assessment/Plan: This is a 54 y.o. female s/p laparoscopic cholecystectomy, with possible wound infections.  --Discussed with the patient that these could potentially be yeast infections on the incision, but I cannot rule out bacterial infection.  As such, we will prescribe Bactrim DS for 1 week course, in addition to Nystatin powder to help in case of fungal or bacterial etiology. --Will follow up on 2/24 for wound check.  She understands to call 3/24 sooner if any worsening of the incisions.   [place], MD [facility]

## 2019-04-01 NOTE — Patient Instructions (Addendum)
Please pick up your prescriptions at your pharmacy. When putting the powder prescription on you may apply a dry gauze over the area to keep the powder in please.   We will see you in 1 week to follow up. If you feel like everything is looking okay you may cancel the appointment. Please see your follow up appointment below.  Please call the office if you have any questions or concerns.

## 2019-04-05 ENCOUNTER — Encounter: Payer: BC Managed Care – PPO | Admitting: Surgery

## 2019-04-10 ENCOUNTER — Other Ambulatory Visit: Payer: Self-pay

## 2019-04-10 ENCOUNTER — Ambulatory Visit (INDEPENDENT_AMBULATORY_CARE_PROVIDER_SITE_OTHER): Payer: Self-pay | Admitting: Surgery

## 2019-04-10 ENCOUNTER — Encounter: Payer: Self-pay | Admitting: Surgery

## 2019-04-10 VITALS — BP 124/81 | HR 88 | Temp 97.2°F | Resp 12 | Ht 67.0 in | Wt 152.2 lb

## 2019-04-10 DIAGNOSIS — Z09 Encounter for follow-up examination after completed treatment for conditions other than malignant neoplasm: Secondary | ICD-10-CM

## 2019-04-10 DIAGNOSIS — T8149XA Infection following a procedure, other surgical site, initial encounter: Secondary | ICD-10-CM

## 2019-04-10 NOTE — Patient Instructions (Signed)
You may continue to use the nystatin powder as needed.  Please call the office if you have any questions or concerns.

## 2019-04-10 NOTE — Progress Notes (Signed)
04/10/2019  HPI: Annette Phillips is a 54 y.o. female s/p laparoscopic cholecystectomy on 02/28/19.  She presents today for follow up.  She was seen on 2/15 at which point two of her incisions had a mild infection.  She was treated with Bactrim and Nystatin powder.  Today, she reports the wounds are much better.  Denies any drainage from the incisions.  No pain issues.  Vital signs: BP 124/81   Pulse 88   Temp (!) 97.2 F (36.2 C)   Resp 12   Ht 5\' 7"  (1.702 m)   Wt 152 lb 3.2 oz (69 kg)   SpO2 97%   BMI 23.84 kg/m    Physical Exam: Constitutional:  No acute distress Abdomen:  Soft, nondistended, nontender to palpation.  Incisions healing well, without any further evidence of infection.    Assessment/Plan: This is a 54 y.o. female s/p laparoscopic cholecystectomy with post-op wound infection.  --Patient is healing well, no further antibiotics needed. --She can resume normal activities. --Follow up prn.   56, MD [facility]

## 2019-04-30 ENCOUNTER — Telehealth: Payer: Self-pay | Admitting: Family Medicine

## 2019-04-30 DIAGNOSIS — E039 Hypothyroidism, unspecified: Secondary | ICD-10-CM

## 2019-04-30 NOTE — Telephone Encounter (Signed)
Please advise 

## 2019-04-30 NOTE — Telephone Encounter (Signed)
Patient reports that she is still taking Synthroid daily. Patient does not want to change the dose. Patient reports that when she was here for her post-op, she was told that labs including TSH would be checked in June 2021. Please advise.

## 2019-04-30 NOTE — Telephone Encounter (Signed)
Did she decrease her dose of synthroid in December when TSH was low, because it was even lower in January?  Synthroid dose was too high.  Due to recheck TSH.

## 2019-04-30 NOTE — Telephone Encounter (Signed)
Requested Prescriptions  Pending Prescriptions Disp Refills  . SYNTHROID 100 MCG tablet [Pharmacy Med Name: SYNTHROID 100 MCG TABLET] 90 tablet 0    Sig: TAKE 1 TABLET BY MOUTH EVERY DAY (NEEDS OFFICE VISIT FOR FUTURE REFILL)     Endocrinology:  Hypothyroid Agents Failed - 04/30/2019  1:30 AM      Failed - TSH needs to be rechecked within 3 months after an abnormal result. Refill until TSH is due.      Failed - TSH in normal range and within 360 days    TSH  Date Value Ref Range Status  02/26/2019 0.027 (L) 0.350 - 4.500 uIU/mL Final    Comment:    Performed by a 3rd Generation assay with a functional sensitivity of <=0.01 uIU/mL. Performed at [facility], [address] [place], [place] [address]   01/30/2019 0.098 (L) 0.450 - 4.500 uIU/mL Final         Passed - Valid encounter within last 12 months    Recent Outpatient Visits          1 month ago Transaminitis   [facility] [place], Marzella Schlein, MD   3 months ago Acquired hypothyroidism   [facility], Marzella Schlein, MD

## 2019-04-30 NOTE — Telephone Encounter (Signed)
Patient advised as below. Patient verbalizes understanding and is in agreement with treatment plan.  

## 2019-04-30 NOTE — Telephone Encounter (Signed)
I changed my mind after seeing that even lower TSH. Please have her recheck next month. It can be just as problematic to be overcorrected as it is to be under corrected. If it is still low, we will need to adjust the dose

## 2019-04-30 NOTE — Addendum Note (Signed)
Addended by: Hyacinth Meeker on: 04/30/2019 02:59 PM   Modules accepted: Orders

## 2019-07-12 NOTE — Progress Notes (Signed)
Complete physical exam   Patient: Annette Phillips   DOB: 09/14/1965   54 y.o. Female  MRN: [id number] Visit Date: 07/16/2019  Today's healthcare provider: Shirlee Latch, MD   Chief Complaint  Patient presents with  . Annual Exam   Subjective    Annette Phillips is a 54 y.o. female who presents today for a complete physical exam.  She reports consuming a general diet. Home exercise routine includes stretching. She generally feels well. She reports sleeping well. She does not have additional problems to discuss today.  HPI  08/13/2017 Pap-normal 07/11/2014 Mammogram-BI-RADS 1 10/15/2015 Colonoscopy-normal   Past Medical History:  Diagnosis Date  . Goiter   . Hypothyroid   . Prediabetes   . Recurrent cold sores    Past Surgical History:  Procedure Laterality Date  . APPENDECTOMY    . CHOLECYSTECTOMY N/A 02/28/2019   Procedure: LAPAROSCOPIC CHOLECYSTECTOMY;  Surgeon: Henrene Dodge, MD;  Location: [facility] ORS;  Service: General;  Laterality: N/A;  . ESOPHAGOGASTRODUODENOSCOPY (EGD) WITH PROPOFOL N/A 02/27/2019   Procedure: ESOPHAGOGASTRODUODENOSCOPY (EGD) WITH PROPOFOL;  Surgeon: Toney Reil, MD;  Location: [facility] ENDOSCOPY;  Service: Gastroenterology;  Laterality: N/A;  . KNEE ARTHROSCOPY Left 2008   torn meniscus   Social History   Socioeconomic History  . Marital status: Divorced    Spouse name: Not on file  . Number of children: 3  . Years of education: Not on file  . Highest education level: Not on file  Occupational History  . Not on file  Tobacco Use  . Smoking status: Never Smoker  . Smokeless tobacco: Never Used  Substance and Sexual Activity  . Alcohol use: Yes    Alcohol/week: 0.0 standard drinks    Comment: rare  . Drug use: Never  . Sexual activity: Not Currently    Birth control/protection: Post-menopausal  Other Topics Concern  . Not on file  Social History Narrative  . Not on file   Social Determinants of Health   Financial Resource  Strain:   . Difficulty of Paying Living Expenses:   Food Insecurity:   . Worried About Programme researcher, broadcasting/film/video in the Last Year:   . Barista in the Last Year:   Transportation Needs:   . Freight forwarder (Medical):   Marland Kitchen Lack of Transportation (Non-Medical):   Physical Activity:   . Days of Exercise per Week:   . Minutes of Exercise per Session:   Stress:   . Feeling of Stress :   Social Connections:   . Frequency of Communication with Friends and Family:   . Frequency of Social Gatherings with Friends and Family:   . Attends Religious Services:   . Active Member of Clubs or Organizations:   . Attends Banker Meetings:   Marland Kitchen Marital Status:   Intimate Partner Violence:   . Fear of Current or Ex-Partner:   . Emotionally Abused:   Marland Kitchen Physically Abused:   . Sexually Abused:    Family Status  Relation Name Status  . Mother  Deceased at age 42  . Father  Alive  . Sister  Alive  . Daughter  Alive  . Son  Alive  . Son  Alive  . MGM  (Not Specified)  . MGF  (Not Specified)  . Neg Hx  (Not Specified)   Family History  Problem Relation Age of Onset  . Thyroid disease Mother   . Diabetes Mellitus II Mother   . Thyroid disease Father   .  Hypothyroidism Daughter   . Hypothyroidism Son   . Diabetes Mellitus I Son   . Kidney cancer Maternal Grandmother   . Lung cancer Maternal Grandfather        smoker  . Colon cancer Neg Hx   . Breast cancer Neg Hx    Allergies  Allergen Reactions  . Aspirin Other (See Comments)  . Penicillin G Other (See Comments)    Patient Care Team: Erasmo Downer, MD as PCP - General (Family Medicine)   Medications: Outpatient Medications Prior to Visit  Medication Sig  . docusate sodium (COLACE) 100 MG capsule Take 100 mg by mouth daily as needed.   . Multiple Vitamin (MULTI-VITAMINS) TABS Take 1 tablet by mouth daily.   Marland Kitchen SYNTHROID 100 MCG tablet TAKE 1 TABLET BY MOUTH EVERY DAY (NEEDS OFFICE VISIT FOR FUTURE REFILL)    . [DISCONTINUED] nystatin (NYSTATIN) powder Apply 1 application topically 3 (three) times daily. (Patient not taking: Reported on 07/16/2019)  . [DISCONTINUED] pantoprazole (PROTONIX) 40 MG tablet Take 1 tablet (40 mg total) by mouth daily.   No facility-administered medications prior to visit.    Review of Systems  Constitutional: Negative.   HENT: Negative.   Eyes: Negative.   Respiratory: Negative.   Cardiovascular: Negative.   Gastrointestinal: Negative.   Endocrine: Negative.   Genitourinary: Negative.   Musculoskeletal: Negative.   Skin: Negative.   Allergic/Immunologic: Negative.   Neurological: Negative.   Hematological: Negative.   Psychiatric/Behavioral: Negative.     Last CBC Lab Results  Component Value Date   WBC 6.0 03/15/2019   HGB 11.5 03/15/2019   HCT 35.0 03/15/2019   MCV 87 03/15/2019   MCH 28.7 03/15/2019   RDW 14.2 03/15/2019   PLT 260 03/15/2019   Last metabolic panel Lab Results  Component Value Date   GLUCOSE 94 03/15/2019   NA 137 03/15/2019   K 4.1 03/15/2019   CL 102 03/15/2019   CO2 24 03/15/2019   BUN 14 03/15/2019   CREATININE 0.67 03/15/2019   GFRNONAA 100 03/15/2019   GFRAA 115 03/15/2019   CALCIUM 9.6 03/15/2019   PROT 7.0 03/15/2019   ALBUMIN 4.3 03/15/2019   LABGLOB 2.7 03/15/2019   AGRATIO 1.6 03/15/2019   BILITOT 0.4 03/15/2019   ALKPHOS 64 03/15/2019   AST 32 03/15/2019   ALT 43 (H) 03/15/2019   ANIONGAP 6 02/28/2019   Last lipids Lab Results  Component Value Date   CHOL 157 01/30/2019   HDL 36 (L) 01/30/2019   LDLCALC 96 01/30/2019   TRIG 143 01/30/2019   CHOLHDL 4.4 01/30/2019   Last thyroid functions Lab Results  Component Value Date   TSH 0.027 (L) 02/26/2019   Last vitamin D No results found for: 25OHVITD2, 25OHVITD3, VD25OH Last vitamin B12 and Folate Lab Results  Component Value Date   VITAMINB12 1,474 (H) 02/27/2019   FOLATE 36.0 02/27/2019      Objective    BP 108/70 (BP Location: Left  Arm, Patient Position: Sitting, Cuff Size: Normal)   Pulse 83   Temp (!) 97.5 F (36.4 C) (Temporal)   Resp 16   Ht 5\' 7"  (1.702 m)   Wt 145 lb 6.4 oz (66 kg)   BMI 22.77 kg/m  BP Readings from Last 3 Encounters:  07/16/19 108/70  04/10/19 124/81  04/01/19 134/88   Wt Readings from Last 3 Encounters:  07/16/19 145 lb 6.4 oz (66 kg)  04/10/19 152 lb 3.2 oz (69 kg)  04/01/19 152 lb (68.9 kg)  Physical Exam Vitals reviewed.  Constitutional:      General: She is not in acute distress.    Appearance: Normal appearance. She is well-developed. She is not diaphoretic.  HENT:     Head: Normocephalic and atraumatic.     Right Ear: Tympanic membrane, ear canal and external ear normal.     Left Ear: Tympanic membrane, ear canal and external ear normal.     Mouth/Throat:     Pharynx: No oropharyngeal exudate.  Eyes:     General: No scleral icterus.    Conjunctiva/sclera: Conjunctivae normal.     Pupils: Pupils are equal, round, and reactive to light.  Neck:     Thyroid: No thyromegaly.  Cardiovascular:     Rate and Rhythm: Normal rate and regular rhythm.     Pulses: Normal pulses.     Heart sounds: Normal heart sounds. No murmur.  Pulmonary:     Effort: Pulmonary effort is normal. No respiratory distress.     Breath sounds: Normal breath sounds. No wheezing or rales.  Abdominal:     General: There is no distension.     Palpations: Abdomen is soft.     Tenderness: There is no abdominal tenderness.  Musculoskeletal:        General: No deformity.     Cervical back: Neck supple.     Right lower leg: No edema.     Left lower leg: No edema.  Lymphadenopathy:     Cervical: No cervical adenopathy.  Skin:    General: Skin is warm and dry.     Findings: No rash.  Neurological:     Mental Status: She is alert and oriented to person, place, and time. Mental status is at baseline.     Sensory: No sensory deficit.     Motor: No weakness.     Gait: Gait normal.  Psychiatric:         Mood and Affect: Mood normal.        Behavior: Behavior normal.        Thought Content: Thought content normal.      Depression Screen  PHQ 2/9 Scores 07/16/2019 01/30/2019  PHQ - 2 Score 0 0  PHQ- 9 Score 0 0    No results found for any visits on 07/16/19.  Assessment & Plan    Routine Health Maintenance and Physical Exam  Exercise Activities and Dietary recommendations Goals   None     Immunization History  Administered Date(s) Administered  . Td 01/30/2019    Health Maintenance  Topic Date Due  . MAMMOGRAM  07/10/2016  . INFLUENZA VACCINE  09/15/2019  . PAP SMEAR-Modifier  07/17/2020  . COLONOSCOPY  10/14/2025  . TETANUS/TDAP  01/29/2029  . HIV Screening  Completed    Discussed health benefits of physical activity, and encouraged her to engage in regular exercise appropriate for her age and condition.  Problem List Items Addressed This Visit      Endocrine   Hypothyroid    Previously overcorrected around recent surgery Continue Synthroid at current dose  Recheck TSH and adjust Synthroid as indicated        Relevant Orders   TSH     Other   Family history of diabetes mellitus    Strong family history of T1 and T2 DM Check annual A1c      Relevant Orders   Hemoglobin A1c   Transaminitis    Related to cholecystitis Recheck to ensure return to normal  Relevant Orders   Comprehensive metabolic panel    Other Visit Diagnoses    Annual physical exam    -  Primary   Relevant Orders   Comprehensive metabolic panel   Lipid panel   Hemoglobin A1c   TSH   Breast cancer screening by mammogram       Relevant Orders   MM 3D SCREEN BREAST BILATERAL       Return in about 1 year (around 07/15/2020) for CPE.     I, Lavon Paganini, MD, have reviewed all documentation for this visit. The documentation on 07/16/19 for the exam, diagnosis, procedures, and orders are all accurate and complete.   Jaquay Posthumus, Dionne Bucy, MD, MPH [facility]

## 2019-07-16 ENCOUNTER — Encounter: Payer: Self-pay | Admitting: Family Medicine

## 2019-07-16 ENCOUNTER — Ambulatory Visit (INDEPENDENT_AMBULATORY_CARE_PROVIDER_SITE_OTHER): Payer: BC Managed Care – PPO | Admitting: Family Medicine

## 2019-07-16 ENCOUNTER — Other Ambulatory Visit: Payer: Self-pay

## 2019-07-16 VITALS — BP 108/70 | HR 83 | Temp 97.5°F | Resp 16 | Ht 67.0 in | Wt 145.4 lb

## 2019-07-16 DIAGNOSIS — Z833 Family history of diabetes mellitus: Secondary | ICD-10-CM

## 2019-07-16 DIAGNOSIS — Z0000 Encounter for general adult medical examination without abnormal findings: Secondary | ICD-10-CM

## 2019-07-16 DIAGNOSIS — E039 Hypothyroidism, unspecified: Secondary | ICD-10-CM

## 2019-07-16 DIAGNOSIS — R7401 Elevation of levels of liver transaminase levels: Secondary | ICD-10-CM | POA: Diagnosis not present

## 2019-07-16 DIAGNOSIS — Z1231 Encounter for screening mammogram for malignant neoplasm of breast: Secondary | ICD-10-CM

## 2019-07-16 NOTE — Assessment & Plan Note (Signed)
Strong family history of T1 and T2 DM Check annual A1c

## 2019-07-16 NOTE — Assessment & Plan Note (Signed)
Previously overcorrected around recent surgery Continue Synthroid at current dose  Recheck TSH and adjust Synthroid as indicated

## 2019-07-16 NOTE — Patient Instructions (Signed)

## 2019-07-16 NOTE — Assessment & Plan Note (Signed)
Related to cholecystitis Recheck to ensure return to normal

## 2019-07-17 ENCOUNTER — Telehealth: Payer: Self-pay

## 2019-07-17 DIAGNOSIS — E039 Hypothyroidism, unspecified: Secondary | ICD-10-CM

## 2019-07-17 DIAGNOSIS — R748 Abnormal levels of other serum enzymes: Secondary | ICD-10-CM

## 2019-07-17 LAB — LIPID PANEL
Chol/HDL Ratio: 4 ratio (ref 0.0–4.4)
Cholesterol, Total: 140 mg/dL (ref 100–199)
HDL: 35 mg/dL — ABNORMAL LOW (ref >39)
LDL Chol Calc (NIH): 85 mg/dL (ref 0–99)
Triglycerides: 109 mg/dL (ref 0–149)
VLDL Cholesterol Cal: 20 mg/dL (ref 5–40)

## 2019-07-17 LAB — COMPREHENSIVE METABOLIC PANEL
ALT: 61 IU/L — ABNORMAL HIGH (ref 0–32)
AST: 54 IU/L — ABNORMAL HIGH (ref 0–40)
Albumin/Globulin Ratio: 1.6 (ref 1.2–2.2)
Albumin: 4.1 g/dL (ref 3.8–4.9)
Alkaline Phosphatase: 68 IU/L (ref 48–121)
BUN/Creatinine Ratio: 23 (ref 9–23)
BUN: 16 mg/dL (ref 6–24)
Bilirubin Total: 0.6 mg/dL (ref 0.0–1.2)
CO2: 21 mmol/L (ref 20–29)
Calcium: 9.1 mg/dL (ref 8.7–10.2)
Chloride: 102 mmol/L (ref 96–106)
Creatinine, Ser: 0.7 mg/dL (ref 0.57–1.00)
GFR calc Af Amer: 114 mL/min/{1.73_m2} (ref >59)
GFR calc non Af Amer: 99 mL/min/{1.73_m2} (ref >59)
Globulin, Total: 2.6 g/dL (ref 1.5–4.5)
Glucose: 84 mg/dL (ref 65–99)
Potassium: 3.6 mmol/L (ref 3.5–5.2)
Sodium: 137 mmol/L (ref 134–144)
Total Protein: 6.7 g/dL (ref 6.0–8.5)

## 2019-07-17 LAB — HEMOGLOBIN A1C
Est. average glucose Bld gHb Est-mCnc: 108 mg/dL
Hgb A1c MFr Bld: 5.4 % (ref 4.8–5.6)

## 2019-07-17 LAB — TSH: TSH: 0.007 u[IU]/mL — ABNORMAL LOW (ref 0.450–4.500)

## 2019-07-17 MED ORDER — LEVOTHYROXINE SODIUM 88 MCG PO TABS: 88.0000 ug | ORAL_TABLET | Freq: Every day | ORAL | 3 refills | Status: DC

## 2019-07-17 NOTE — Telephone Encounter (Signed)
Comment seen by patient Annette Phillips on 07/17/2019 8:21 AM EDT

## 2019-07-17 NOTE — Telephone Encounter (Signed)
-----   Message from Erasmo Downer, MD sent at 07/17/2019  8:15 AM EDT ----- Normal labs, except TSH is quite low.  This means that synthroid dose is too high.  Recommend decreasing from 100 to 88 mcg daily.  Recheck TSH in 2 months - labs only.  Liver function tests have elevated slightly again.  Drink plenty of water and avoid tylenol and alcohol.  Recheck LFTs in 2 months as well.

## 2019-07-26 ENCOUNTER — Ambulatory Visit
Admission: RE | Admit: 2019-07-26 | Discharge: 2019-07-26 | Disposition: A | Discharge status: Home / Self Care | Payer: BC Managed Care – PPO | Source: Ambulatory Visit | Attending: Family Medicine | Admitting: Family Medicine

## 2019-07-26 DIAGNOSIS — Z1231 Encounter for screening mammogram for malignant neoplasm of breast: Secondary | ICD-10-CM | POA: Insufficient documentation

## 2019-08-01 ENCOUNTER — Other Ambulatory Visit: Payer: Self-pay | Admitting: Family Medicine

## 2019-08-06 ENCOUNTER — Other Ambulatory Visit: Payer: Self-pay | Admitting: *Deleted

## 2019-08-06 ENCOUNTER — Inpatient Hospital Stay
Admission: RE | Admit: 2019-08-06 | Discharge: 2019-08-06 | Disposition: A | Discharge status: Home / Self Care | Payer: Self-pay | Source: Ambulatory Visit | Attending: *Deleted | Admitting: *Deleted

## 2019-08-06 DIAGNOSIS — Z1231 Encounter for screening mammogram for malignant neoplasm of breast: Secondary | ICD-10-CM

## 2019-08-07 ENCOUNTER — Telehealth: Payer: Self-pay

## 2019-08-07 NOTE — Telephone Encounter (Signed)
Seen by patient Annette Phillips on 08/07/2019 10:47 AM

## 2019-08-07 NOTE — Telephone Encounter (Signed)
-----   Message from Erasmo Downer, MD sent at 08/07/2019  9:18 AM EDT ----- Normal mammogram. Repeat in 1 yr

## 2019-09-23 ENCOUNTER — Telehealth: Payer: Self-pay

## 2019-09-23 DIAGNOSIS — E039 Hypothyroidism, unspecified: Secondary | ICD-10-CM

## 2019-09-23 NOTE — Telephone Encounter (Signed)
Copied from CRM [id number]. Topic: General - Call Back - No Documentation >> Sep 23, 2019  2:08 PM Annette Phillips wrote: Reason for CRM: Patient would like a call back to clarify labs that were requested from PCP. Please call patient back with resolution.

## 2019-09-23 NOTE — Telephone Encounter (Signed)
Patient was calling to make sure we have lab order placed.

## 2019-09-24 ENCOUNTER — Other Ambulatory Visit: Payer: Self-pay | Admitting: Family Medicine

## 2019-09-25 ENCOUNTER — Telehealth: Payer: Self-pay

## 2019-09-25 LAB — HEPATIC FUNCTION PANEL
ALT: 49 IU/L — ABNORMAL HIGH (ref 0–32)
AST: 54 IU/L — ABNORMAL HIGH (ref 0–40)
Albumin: 4.2 g/dL (ref 3.8–4.9)
Alkaline Phosphatase: 66 IU/L (ref 48–121)
Bilirubin Total: 0.5 mg/dL (ref 0.0–1.2)
Bilirubin, Direct: 0.17 mg/dL (ref 0.00–0.40)
Total Protein: 7 g/dL (ref 6.0–8.5)

## 2019-09-25 LAB — TSH: TSH: 0.065 u[IU]/mL — ABNORMAL LOW (ref 0.450–4.500)

## 2019-09-25 NOTE — Telephone Encounter (Signed)
We can keep it the same for now and recheck in a few months.

## 2019-09-25 NOTE — Telephone Encounter (Signed)
Patient advised as below. Patient reports she has been feeling very tired since changing levothyroxine from 100 to . Patient does not want to change medication again. Please advise.

## 2019-09-25 NOTE — Telephone Encounter (Signed)
-----   Message from Erasmo Downer, MD sent at 09/25/2019  8:26 AM EDT ----- Slight improvement in liver function tests.  TSH remains low, meaning that she is getting too much thyroid hormone.  If she is currently taking levothyroxine 88 mcg daily, we should decrease to 75 mcg daily and recheck TSH in 2 months.  Okay to send in prescription if patient agrees

## 2020-07-16 ENCOUNTER — Encounter: Payer: Self-pay | Admitting: Family Medicine

## 2020-07-16 ENCOUNTER — Ambulatory Visit (INDEPENDENT_AMBULATORY_CARE_PROVIDER_SITE_OTHER): Payer: 59 | Admitting: Family Medicine

## 2020-07-16 ENCOUNTER — Other Ambulatory Visit: Payer: Self-pay

## 2020-07-16 VITALS — BP 113/86 | HR 99 | Temp 98.4°F | Resp 16 | Ht 67.0 in | Wt 146.8 lb

## 2020-07-16 DIAGNOSIS — K649 Unspecified hemorrhoids: Secondary | ICD-10-CM | POA: Insufficient documentation

## 2020-07-16 DIAGNOSIS — R748 Abnormal levels of other serum enzymes: Secondary | ICD-10-CM | POA: Diagnosis not present

## 2020-07-16 DIAGNOSIS — Z13220 Encounter for screening for lipoid disorders: Secondary | ICD-10-CM | POA: Diagnosis not present

## 2020-07-16 DIAGNOSIS — Z0000 Encounter for general adult medical examination without abnormal findings: Secondary | ICD-10-CM | POA: Diagnosis not present

## 2020-07-16 DIAGNOSIS — Z833 Family history of diabetes mellitus: Secondary | ICD-10-CM

## 2020-07-16 DIAGNOSIS — Z1231 Encounter for screening mammogram for malignant neoplasm of breast: Secondary | ICD-10-CM

## 2020-07-16 DIAGNOSIS — E039 Hypothyroidism, unspecified: Secondary | ICD-10-CM | POA: Diagnosis not present

## 2020-07-16 NOTE — Assessment & Plan Note (Signed)
Occasional bleeding Will continue to monitor Consider GI referral in the future
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
## 2020-07-16 NOTE — Assessment & Plan Note (Signed)
Previously well controlled Continue Synthroid at current dose  Recheck TSH and adjust Synthroid as indicated   

## 2020-07-16 NOTE — Patient Instructions (Signed)
Preventive Care 56-56 Years Old, Female Preventive care refers to lifestyle choices and visits with your health care provider that can promote health and wellness. This includes:  A yearly physical exam. This is also called an annual wellness visit.  Regular dental and eye exams.  Immunizations.  Screening for certain conditions.  Healthy lifestyle choices, such as: ? Eating a healthy diet. ? Getting regular exercise. ? Not using drugs or products that contain nicotine and tobacco. ? Limiting alcohol use. What can I expect for my preventive care visit? Physical exam Your health care provider will check your:  Height and weight. These may be used to calculate your BMI (body mass index). BMI is a measurement that tells if you are at a healthy weight.  Heart rate and blood pressure.  Body temperature.  Skin for abnormal spots. Counseling Your health care provider may ask you questions about your:  Past medical problems.  Family's medical history.  Alcohol, tobacco, and drug use.  Emotional well-being.  Home life and relationship well-being.  Sexual activity.  Diet, exercise, and sleep habits.  Work and work Statistician.  Access to firearms.  Method of birth control.  Menstrual cycle.  Pregnancy history. What immunizations do I need? Vaccines are usually given at various ages, according to a schedule. Your health care provider will recommend vaccines for you based on your age, medical history, and lifestyle or other factors, such as travel or where you work.   What tests do I need? Blood tests  Lipid and cholesterol levels. These may be checked every 5 years, or more often if you are over 3 years old.  Hepatitis C test.  Hepatitis B test. Screening  Lung cancer screening. You may have this screening every year starting at age 56 if you have a 30-pack-year history of smoking and currently smoke or have quit within the past 15 years.  Colorectal cancer  screening. ? All adults should have this screening starting at age 56 and continuing until age 17. ? Your health care provider may recommend screening at age 56 if you are at increased risk. ? You will have tests every 1-10 years, depending on your results and the type of screening test.  Diabetes screening. ? This is done by checking your blood sugar (glucose) after you have not eaten for a while (fasting). ? You may have this done every 1-3 years.  Mammogram. ? This may be done every 1-2 years. ? Talk with your health care provider about when you should start having regular mammograms. This may depend on whether you have a family history of breast cancer.  BRCA-related cancer screening. This may be done if you have a family history of breast, ovarian, tubal, or peritoneal cancers.  Pelvic exam and Pap test. ? This may be done every 3 years starting at age 56. ? Starting at age 11, this may be done every 5 years if you have a Pap test in combination with an HPV test. Other tests  STD (sexually transmitted disease) testing, if you are at risk.  Bone density scan. This is done to screen for osteoporosis. You may have this scan if you are at high risk for osteoporosis. Talk with your health care provider about your test results, treatment options, and if necessary, the need for more tests. Follow these instructions at home: Eating and drinking  Eat a diet that includes fresh fruits and vegetables, whole grains, lean protein, and low-fat dairy products.  Take vitamin and mineral supplements  as recommended by your health care provider.  Do not drink alcohol if: ? Your health care provider tells you not to drink. ? You are pregnant, may be pregnant, or are planning to become pregnant.  If you drink alcohol: ? Limit how much you have to 0-1 drink a day. ? Be aware of how much alcohol is in your drink. In the [place] one drink equals one 12 oz bottle of beer (355 mL), one 5 oz glass of  wine (148 mL), or one 1 oz glass of hard liquor (44 mL).   Lifestyle  Take daily care of your teeth and gums. Brush your teeth every morning and night with fluoride toothpaste. Floss one time each day.  Stay active. Exercise for at least 30 minutes 5 or more days each week.  Do not use any products that contain nicotine or tobacco, such as cigarettes, e-cigarettes, and chewing tobacco. If you need help quitting, ask your health care provider.  Do not use drugs.  If you are sexually active, practice safe sex. Use a condom or other form of protection to prevent STIs (sexually transmitted infections).  If you do not wish to become pregnant, use a form of birth control. If you plan to become pregnant, see your health care provider for a prepregnancy visit.  If told by your health care provider, take low-dose aspirin daily starting at age 50.  Find healthy ways to cope with stress, such as: ? Meditation, yoga, or listening to music. ? Journaling. ? Talking to a trusted person. ? Spending time with friends and family. Safety  Always wear your seat belt while driving or riding in a vehicle.  Do not drive: ? If you have been drinking alcohol. Do not ride with someone who has been drinking. ? When you are tired or distracted. ? While texting.  Wear a helmet and other protective equipment during sports activities.  If you have firearms in your house, make sure you follow all gun safety procedures. What's next?  Visit your health care provider once a year for an annual wellness visit.  Ask your health care provider how often you should have your eyes and teeth checked.  Stay up to date on all vaccines. This information is not intended to replace advice given to you by your health care provider. Make sure you discuss any questions you have with your health care provider. Document Revised: 11/05/2019 Document Reviewed: 10/12/2017 Elsevier Patient Education  2021 Elsevier Inc.  

## 2020-07-17 ENCOUNTER — Encounter: Payer: Self-pay | Admitting: Family Medicine

## 2020-07-17 LAB — COMPREHENSIVE METABOLIC PANEL
ALT: 26 IU/L (ref 0–32)
AST: 27 IU/L (ref 0–40)
Albumin/Globulin Ratio: 1.5 (ref 1.2–2.2)
Albumin: 4.3 g/dL (ref 3.8–4.9)
Alkaline Phosphatase: 59 IU/L (ref 44–121)
BUN/Creatinine Ratio: 22 (ref 9–23)
BUN: 18 mg/dL (ref 6–24)
Bilirubin Total: 0.4 mg/dL (ref 0.0–1.2)
CO2: 24 mmol/L (ref 20–29)
Calcium: 9.4 mg/dL (ref 8.7–10.2)
Chloride: 101 mmol/L (ref 96–106)
Creatinine, Ser: 0.83 mg/dL (ref 0.57–1.00)
Globulin, Total: 2.8 g/dL (ref 1.5–4.5)
Glucose: 89 mg/dL (ref 65–99)
Potassium: 3.9 mmol/L (ref 3.5–5.2)
Sodium: 139 mmol/L (ref 134–144)
Total Protein: 7.1 g/dL (ref 6.0–8.5)
eGFR: 83 mL/min/{1.73_m2} (ref >59)

## 2020-07-17 LAB — LIPID PANEL WITH LDL/HDL RATIO
Cholesterol, Total: 182 mg/dL (ref 100–199)
HDL: 48 mg/dL (ref >39)
LDL Chol Calc (NIH): 114 mg/dL — ABNORMAL HIGH (ref 0–99)
LDL/HDL Ratio: 2.4 ratio (ref 0.0–3.2)
Triglycerides: 113 mg/dL (ref 0–149)
VLDL Cholesterol Cal: 20 mg/dL (ref 5–40)

## 2020-07-17 LAB — TSH: TSH: 1.32 u[IU]/mL (ref 0.450–4.500)

## 2020-07-20 ENCOUNTER — Other Ambulatory Visit: Payer: Self-pay | Admitting: Family Medicine

## 2020-07-20 DIAGNOSIS — E039 Hypothyroidism, unspecified: Secondary | ICD-10-CM

## 2020-07-20 NOTE — Telephone Encounter (Signed)
Requested medications are due for refill today.  yes  Requested medications are on the active medications list.  yes  Last refill. 07/17/2019  Future visit scheduled.   yes  Notes to clinic.  Prescription is expired. 

## 2020-08-31 ENCOUNTER — Ambulatory Visit: Payer: Self-pay | Admitting: *Deleted

## 2020-08-31 MED ORDER — BENZONATATE 100 MG PO CAPS: 100.0000 mg | ORAL_CAPSULE | Freq: Three times a day (TID) | ORAL | 0 refills | Status: DC | PRN

## 2020-08-31 NOTE — Telephone Encounter (Signed)
Pt called in stating she tested positive for Covid and wanted to see about getting some type of cough medication. Please advise.   Patient called back and reports symptoms of fever and cough and body aches started last Thursday 08/27/20. Saturday fever 102. No fever now. C/o constant dry cough. Non productive. Patient is working remotely and requesting cough medication . Delsym is not relieving cough. C/o soreness in body due to coughing. No available appt until August. Please advise . Care advise given. Patient verbalized understanding of care advise and to call back or go to [facility] or ED if symptoms worsen. Please advise regarding medication.

## 2020-08-31 NOTE — Telephone Encounter (Signed)
Can offer antiviral medication - would need to start today (day 5 of symptoms). Can Rx Tessalon 100mg  bid prn #20 r0 for cough.

## 2020-08-31 NOTE — Telephone Encounter (Signed)
Reason for Disposition  [1] Continuous (nonstop) coughing interferes with work or school AND [2] no improvement using cough treatment per Care Advice  Answer Assessment - Initial Assessment Questions 1. COVID-19 DIAGNOSIS: "Who made your COVID-19 diagnosis?" "Was it confirmed by a positive lab test or self-test?" If not diagnosed by a doctor (or NP/PA), ask "Are there lots of cases (community spread) where you live?" Note: See public health department website, if unsure.     Positive covid tested  in [place] 2. COVID-19 EXPOSURE: "Was there any known exposure to COVID before the symptoms began?" CDC Definition of close contact: within 6 feet (2 meters) for a total of 15 minutes or more over a 24-hour period.      unknown 3. ONSET: "When did the COVID-19 symptoms start?"      Thursday , fever, cough  4. WORST SYMPTOM: "What is your worst symptom?" (e.g., cough, fever, shortness of breath, muscle aches)     Cough, fever, body aches  5. COUGH: "Do you have a cough?" If Yes, ask: "How bad is the cough?"       Dry cough, bad non stop 6. FEVER: "Do you have a fever?" If Yes, ask: "What is your temperature, how was it measured, and when did it start?"    Not now but  , was 102 Saturday 08/29/20 7. RESPIRATORY STATUS: "Describe your breathing?" (e.g., shortness of breath, wheezing, unable to speak)      No  8. BETTER-SAME-WORSE: "Are you getting better, staying the same or getting worse compared to yesterday?"  If getting worse, ask, "In what way?"     Better just cough is worse 9. HIGH RISK DISEASE: "Do you have any chronic medical problems?" (e.g., asthma, heart or lung disease, weak immune system, obesity, etc.)     No, hypothyroidism  10. VACCINE: "Have you had the COVID-19 vaccine?" If Yes, ask: "Which one, how many shots, when did you get it?"       No  11. BOOSTER: "Have you received your COVID-19 booster?" If Yes, ask: "Which one and when did you get it?"       no 12. PREGNANCY: "Is there  any chance you are pregnant?" "When was your last menstrual period?"       na 13. OTHER SYMPTOMS: "Do you have any other symptoms?"  (e.g., chills, fatigue, headache, loss of smell or taste, muscle pain, sore throat)       Fatigue, cough body aches  14. O2 SATURATION MONITOR:  "Do you use an oxygen saturation monitor (pulse oximeter) at home?" If Yes, ask "What is your reading (oxygen level) today?" "What is your usual oxygen saturation reading?" (e.g., 95%)       na  Protocols used: Coronavirus (COVID-19) Diagnosed or Suspected-A-AH

## 2020-08-31 NOTE — Addendum Note (Signed)
Addended by: Hyacinth Meeker on: 08/31/2020 05:18 PM   Modules accepted: Orders

## 2020-08-31 NOTE — Telephone Encounter (Signed)
Patient advised, agreed to tessalon.

## 2020-09-01 ENCOUNTER — Telehealth: Payer: Self-pay

## 2020-09-01 NOTE — Telephone Encounter (Signed)
Copied from CRM [id number]. Topic: General - Inquiry >> Sep 01, 2020 10:32 AM Aretta Nip wrote: Status: Signed Pt called in stating she tested positive for Covid and wanted to see about getting some type of cough medication. Please advise.   Patient called back and reports symptoms of fever and cough and body aches started last Thursday 08/27/20. Saturday fever 102. No fever now. C/o constant dry cough. Non productive. Patient is working remotely and requesting cough medication . Delsym is not relieving cough. C/o soreness in body due to coughing. No available appt until August. Please advise . Care advise given. Patient verbalized understanding of care advise and to call back or go to [facility] or ED if symptoms worsen. Please advise regarding medication.  Erasmo Downer, MD at 08/31/2020 3:15 PM  Status: Signed Can offer antiviral medication - would need to start today (day 5 of symptoms). Can Rx Tessalon 100mg  bid prn #20 r0 for cough.  [place], [place] at 08/31/2020 5:18 PM Status: Signed   Disp Refills Start End  benzonatate (TESSALON PERLES) 100 MG capsule 20 capsule 0 08/31/2020   Sig - Route: Take 1 capsule (100 mg total) by mouth 3 (three) times daily as needed for cough. - Oral  Class: Phone In  Patient advised, agreed to tessalon.  Pt states that cough med was not called in and states that the only FU call received she remembers was [facility]. [facility], states phone in.? [facility] Fu with pt pls advise [phone]

## 2020-09-01 NOTE — Telephone Encounter (Signed)
Called and spoke with patient and advised her of prescription for Tessalon Perles she states pharmacy never received prescription, [facility] for medication. KW

## 2021-07-19 NOTE — Patient Instructions (Signed)
The CDC recommends two doses of Shingrix (the shingles vaccine) separated by 2 to 6 months for adults age 57 years and older. 

## 2021-07-19 NOTE — Progress Notes (Signed)
I,Sulibeya S Dimas,acting as a Education administrator for Lavon Paganini, MD.,have documented all relevant documentation on the behalf of Lavon Paganini, MD,as directed by  Lavon Paganini, MD while in the presence of Lavon Paganini, MD.   Complete physical exam   Patient: Annette Phillips   DOB: [birth year]   56 y.o. Female  MRN: [id number] Visit Date: 07/22/2021  Today's healthcare provider: Lavon Paganini, MD   Chief Complaint  Patient presents with   Annual Exam   Subjective    Annette Phillips is a 56 y.o. female who presents today for a complete physical exam.  She reports consuming a general diet.  Walking 1 hour twice a week.   She generally feels well. She reports sleeping well. She does have additional problems to discuss today.  HPI  Hypothyroid, follow-up  Lab Results  Component Value Date   TSH 1.320 07/16/2020   TSH 0.065 (L) 09/24/2019   TSH 0.007 (L) 07/16/2019    Wt Readings from Last 3 Encounters:  07/22/21 169 lb 8 oz (76.9 kg)  07/16/20 146 lb 12.8 oz (66.6 kg)  07/16/19 145 lb 6.4 oz (66 kg)    She was last seen for hypothyroid 1 years ago.  Management since that visit includes change. She reports excellent compliance with treatment. She is having side effects. Fatigue since decreasing her medication two years ago.   Symptoms: Yes change in energy level No constipation  No diarrhea No heat / cold intolerance  No nervousness No palpitations  No weight changes    -----------------------------------------------------------------------------------------   Past Medical History:  Diagnosis Date   Goiter    Hypothyroid    Prediabetes    Recurrent cold sores    Past Surgical History:  Procedure Laterality Date   APPENDECTOMY     BREAST BIOPSY Right 2015   benign   CHOLECYSTECTOMY N/A 02/28/2019   Procedure: LAPAROSCOPIC CHOLECYSTECTOMY;  Surgeon: Olean Ree, MD;  Location: [facility];  Service: General;  Laterality: N/A;    ESOPHAGOGASTRODUODENOSCOPY (EGD) WITH PROPOFOL N/A 02/27/2019   Procedure: ESOPHAGOGASTRODUODENOSCOPY (EGD) WITH PROPOFOL;  Surgeon: Lin Landsman, MD;  Location: [place];  Service: Gastroenterology;  Laterality: N/A;   KNEE ARTHROSCOPY Left 2008   torn meniscus   Social History   Socioeconomic History   Marital status: Divorced    Spouse name: Not on file   Number of children: 3   Years of education: Not on file   Highest education level: Not on file  Occupational History   Not on file  Tobacco Use   Smoking status: Never   Smokeless tobacco: Never  Vaping Use   Vaping Use: Never used  Substance and Sexual Activity   Alcohol use: Yes    Alcohol/week: 0.0 standard drinks of alcohol    Comment: rare   Drug use: Never   Sexual activity: Not Currently    Birth control/protection: Post-menopausal  Other Topics Concern   Not on file  Social History Narrative   Not on file   Social Determinants of Health   Financial Resource Strain: Not on file  Food Insecurity: Not on file  Transportation Needs: Not on file  Physical Activity: Not on file  Stress: Not on file  Social Connections: Not on file  Intimate Partner Violence: Not on file   Family Status  Relation Name Status   Mother  Deceased at age 63   Father  Alive   Sister  Alive   Daughter  [place]  Son  Alive   MGM  (Not Specified)   MGF  (Not Specified)   Neg Hx  (Not Specified)   Family History  Problem Relation Age of Onset   Thyroid disease Mother    Diabetes Mellitus II Mother    Thyroid disease Father    Healthy Sister    Hypothyroidism Daughter    Hypothyroidism Son    Diabetes Mellitus I Son    Kidney cancer Maternal Grandmother    Lung cancer Maternal Grandfather        smoker   Colon cancer Neg Hx    Breast cancer Neg Hx    Allergies  Allergen Reactions   Aspirin Other (See Comments)   Penicillin G Other (See Comments)    Patient Care Team: Virginia Crews,  MD as PCP - General (Family Medicine)   Medications: Outpatient Medications Prior to Visit  Medication Sig   Multiple Vitamin (MULTI-VITAMINS) TABS Take 1 tablet by mouth daily.    SYNTHROID 88 MCG tablet TAKE 1 TABLET BY MOUTH EVERY DAY   [DISCONTINUED] benzonatate (TESSALON PERLES) 100 MG capsule Take 1 capsule (100 mg total) by mouth 3 (three) times daily as needed for cough.   No facility-administered medications prior to visit.    Review of Systems  All other systems reviewed and are negative.   Last CBC Lab Results  Component Value Date   WBC 6.0 03/15/2019   HGB 11.5 03/15/2019   HCT 35.0 03/15/2019   MCV 87 03/15/2019   MCH 28.7 03/15/2019   RDW 14.2 03/15/2019   PLT 260 61/68/3729   Last metabolic panel Lab Results  Component Value Date   GLUCOSE 89 07/16/2020   NA 139 07/16/2020   K 3.9 07/16/2020   CL 101 07/16/2020   CO2 24 07/16/2020   BUN 18 07/16/2020   CREATININE 0.83 07/16/2020   EGFR 83 07/16/2020   CALCIUM 9.4 07/16/2020   PROT 7.1 07/16/2020   ALBUMIN 4.3 07/16/2020   LABGLOB 2.8 07/16/2020   AGRATIO 1.5 07/16/2020   BILITOT 0.4 07/16/2020   ALKPHOS 59 07/16/2020   AST 27 07/16/2020   ALT 26 07/16/2020   ANIONGAP 6 02/28/2019   Last lipids Lab Results  Component Value Date   CHOL 182 07/16/2020   HDL 48 07/16/2020   LDLCALC 114 (H) 07/16/2020   TRIG 113 07/16/2020   CHOLHDL 4.0 07/16/2019   Last hemoglobin A1c Lab Results  Component Value Date   HGBA1C 5.4 07/16/2019   Last thyroid functions Lab Results  Component Value Date   TSH 1.320 07/16/2020   Last vitamin B12 and Folate Lab Results  Component Value Date   VITAMINB12 1,474 (H) 02/27/2019   FOLATE 36.0 02/27/2019      Objective     BP (!) 131/118 (BP Location: Left Arm, Patient Position: Sitting, Cuff Size: Large)   Pulse (!) 101   Temp 98.7 F (37.1 C) (Oral)   Resp 16   Ht 5' 7.5" (1.715 m)   Wt 169 lb 8 oz (76.9 kg)   BMI 26.16 kg/m  BP Readings from  Last 3 Encounters:  07/22/21 (!) 131/118  07/16/20 113/86  07/16/19 108/70   Wt Readings from Last 3 Encounters:  07/22/21 169 lb 8 oz (76.9 kg)  07/16/20 146 lb 12.8 oz (66.6 kg)  07/16/19 145 lb 6.4 oz (66 kg)    Physical Exam Vitals reviewed.  Constitutional:      General: She is not in acute distress.  Appearance: Normal appearance. She is well-developed. She is not diaphoretic.  HENT:     Head: Normocephalic and atraumatic.     Right Ear: External ear normal.     Left Ear: External ear normal.     Nose: Nose normal.     Mouth/Throat:     Mouth: Mucous membranes are moist.     Pharynx: Oropharynx is clear. No oropharyngeal exudate.  Eyes:     General: No scleral icterus.    Conjunctiva/sclera: Conjunctivae normal.     Pupils: Pupils are equal, round, and reactive to light.  Neck:     Thyroid: No thyromegaly.  Cardiovascular:     Rate and Rhythm: Normal rate and regular rhythm.     Pulses: Normal pulses.     Heart sounds: Normal heart sounds. No murmur heard. Pulmonary:     Effort: Pulmonary effort is normal. No respiratory distress.     Breath sounds: Normal breath sounds. No wheezing or rales.  Chest:     Comments: Breasts: breasts appear normal, no suspicious masses, no skin or nipple changes or axillary nodes  Abdominal:     General: There is no distension.     Palpations: Abdomen is soft.     Tenderness: There is no abdominal tenderness.  Genitourinary:    Comments: GYN:  External genitalia within normal limits.  Vaginal mucosa pink, moist, normal rugae.  Nonfriable cervix without lesions, no discharge or bleeding noted on speculum exam. Musculoskeletal:        General: No deformity.     Cervical back: Neck supple.     Right lower leg: No edema.     Left lower leg: No edema.  Lymphadenopathy:     Cervical: No cervical adenopathy.  Skin:    General: Skin is warm and dry.     Findings: No rash.  Neurological:     Mental Status: She is alert and oriented  to person, place, and time. Mental status is at baseline.     Gait: Gait normal.  Psychiatric:        Mood and Affect: Mood normal.        Behavior: Behavior normal.        Thought Content: Thought content normal.       Last depression screening scores    07/22/2021    8:32 AM 07/16/2020    8:47 AM 07/16/2019    9:13 AM  PHQ 2/9 Scores  PHQ - 2 Score 0 0 0  PHQ- 9 Score 1 2 0   Last fall risk screening    07/22/2021    8:32 AM  Fall Risk   Falls in the past year? 0  Number falls in past yr: 0  Injury with Fall? 0  Risk for fall due to : No Fall Risks  Follow up Falls evaluation completed   Last Audit-C alcohol use screening    07/22/2021    8:32 AM  Alcohol Use Disorder Test (AUDIT)  1. How often do you have a drink containing alcohol? 1  2. How many drinks containing alcohol do you have on a typical day when you are drinking? 0  3. How often do you have six or more drinks on one occasion? 0  AUDIT-C Score 1   A score of 3 or more in women, and 4 or more in men indicates increased risk for alcohol abuse, EXCEPT if all of the points are from question 1   No results found for any visits on  07/22/21.  Assessment & Plan    Routine Health Maintenance and Physical Exam  Exercise Activities and Dietary recommendations  Goals   None     Immunization History  Administered Date(s) Administered   Td 01/30/2019    Health Maintenance  Topic Date Due   Zoster Vaccines- Shingrix (1 of 2) Never done   PAP SMEAR-Modifier  07/17/2020   MAMMOGRAM  07/25/2021   INFLUENZA VACCINE  09/14/2021   COLONOSCOPY (Pts 45-33yr Insurance coverage will need to be confirmed)  10/14/2025   TETANUS/TDAP  01/29/2029   Hepatitis C Screening  Completed   HIV Screening  Completed   HPV VACCINES  Aged Out    Discussed health benefits of physical activity, and encouraged her to engage in regular exercise appropriate for her age and condition.  Problem List Items Addressed This Visit        Endocrine   Hypothyroid    Previously well controlled She is symptomatic and may need higher dose if TSH is not low normal Continue Synthroid at current dose  Recheck TSH and adjust Synthroid as indicated        Relevant Orders   TSH     Other   Family history of diabetes mellitus    Continue to monitor annual A1c      Relevant Orders   Hemoglobin A1c   Elevated liver enzymes   Relevant Orders   Comprehensive metabolic panel   Other Visit Diagnoses     Annual physical exam    -  Primary   Relevant Orders   Comprehensive metabolic panel   Lipid Panel With LDL/HDL Ratio   TSH   Screening for lipid disorders       Relevant Orders   Lipid Panel With LDL/HDL Ratio   Cervical cancer screening       Relevant Orders   Cytology - PAP   Encounter for screening mammogram for malignant neoplasm of breast       Relevant Orders   MM 3D SCREEN BREAST BILATERAL        Return in about 1 year (around 07/23/2022) for CPE.     I, ALavon Paganini MD, have reviewed all documentation for this visit. The documentation on 07/22/21 for the exam, diagnosis, procedures, and orders are all accurate and complete.   Annette Phillips, ADionne Bucy MD, MPH [facility]

## 2021-07-22 ENCOUNTER — Ambulatory Visit (INDEPENDENT_AMBULATORY_CARE_PROVIDER_SITE_OTHER): Payer: BC Managed Care – PPO | Admitting: Family Medicine

## 2021-07-22 ENCOUNTER — Encounter: Payer: Self-pay | Admitting: Family Medicine

## 2021-07-22 ENCOUNTER — Other Ambulatory Visit (HOSPITAL_COMMUNITY)
Admission: RE | Admit: 2021-07-22 | Discharge: 2021-07-22 | Disposition: A | Discharge status: Home / Self Care | Payer: BC Managed Care – PPO | Source: Ambulatory Visit | Attending: Family Medicine | Admitting: Family Medicine

## 2021-07-22 VITALS — BP 131/118 | HR 101 | Temp 98.7°F | Resp 16 | Ht 67.5 in | Wt 169.5 lb

## 2021-07-22 DIAGNOSIS — R748 Abnormal levels of other serum enzymes: Secondary | ICD-10-CM | POA: Diagnosis not present

## 2021-07-22 DIAGNOSIS — Z13220 Encounter for screening for lipoid disorders: Secondary | ICD-10-CM | POA: Diagnosis not present

## 2021-07-22 DIAGNOSIS — E039 Hypothyroidism, unspecified: Secondary | ICD-10-CM

## 2021-07-22 DIAGNOSIS — Z833 Family history of diabetes mellitus: Secondary | ICD-10-CM

## 2021-07-22 DIAGNOSIS — Z124 Encounter for screening for malignant neoplasm of cervix: Secondary | ICD-10-CM | POA: Diagnosis present

## 2021-07-22 DIAGNOSIS — Z0000 Encounter for general adult medical examination without abnormal findings: Secondary | ICD-10-CM

## 2021-07-22 DIAGNOSIS — Z1231 Encounter for screening mammogram for malignant neoplasm of breast: Secondary | ICD-10-CM

## 2021-07-22 NOTE — Assessment & Plan Note (Signed)
Previously well controlled She is symptomatic and may need higher dose if TSH is not low normal Continue Synthroid at current dose  Recheck TSH and adjust Synthroid as indicated

## 2021-07-22 NOTE — Assessment & Plan Note (Signed)
Continue to monitor annual A1c

## 2021-07-23 LAB — COMPREHENSIVE METABOLIC PANEL
ALT: 21 IU/L (ref 0–32)
AST: 23 IU/L (ref 0–40)
Albumin/Globulin Ratio: 1.5 (ref 1.2–2.2)
Albumin: 4.6 g/dL (ref 3.8–4.9)
Alkaline Phosphatase: 71 IU/L (ref 44–121)
BUN/Creatinine Ratio: 21 (ref 9–23)
BUN: 16 mg/dL (ref 6–24)
Bilirubin Total: 0.3 mg/dL (ref 0.0–1.2)
CO2: 21 mmol/L (ref 20–29)
Calcium: 9.7 mg/dL (ref 8.7–10.2)
Chloride: 100 mmol/L (ref 96–106)
Creatinine, Ser: 0.77 mg/dL (ref 0.57–1.00)
Globulin, Total: 3 g/dL (ref 1.5–4.5)
Glucose: 102 mg/dL — ABNORMAL HIGH (ref 70–99)
Potassium: 4.2 mmol/L (ref 3.5–5.2)
Sodium: 138 mmol/L (ref 134–144)
Total Protein: 7.6 g/dL (ref 6.0–8.5)
eGFR: 90 mL/min/{1.73_m2} (ref >59)

## 2021-07-23 LAB — CYTOLOGY - PAP
Comment: NEGATIVE
Diagnosis: NEGATIVE
High risk HPV: NEGATIVE

## 2021-07-23 LAB — HEMOGLOBIN A1C
Est. average glucose Bld gHb Est-mCnc: 123 mg/dL
Hgb A1c MFr Bld: 5.9 % — ABNORMAL HIGH (ref 4.8–5.6)

## 2021-07-23 LAB — LIPID PANEL WITH LDL/HDL RATIO
Cholesterol, Total: 200 mg/dL — ABNORMAL HIGH (ref 100–199)
HDL: 43 mg/dL (ref >39)
LDL Chol Calc (NIH): 131 mg/dL — ABNORMAL HIGH (ref 0–99)
LDL/HDL Ratio: 3 ratio (ref 0.0–3.2)
Triglycerides: 147 mg/dL (ref 0–149)
VLDL Cholesterol Cal: 26 mg/dL (ref 5–40)

## 2021-07-23 LAB — TSH: TSH: 1.67 u[IU]/mL (ref 0.450–4.500)

## 2021-07-25 ENCOUNTER — Other Ambulatory Visit: Payer: Self-pay | Admitting: Family Medicine

## 2021-07-25 DIAGNOSIS — E039 Hypothyroidism, unspecified: Secondary | ICD-10-CM

## 2021-09-06 ENCOUNTER — Ambulatory Visit
Admission: RE | Admit: 2021-09-06 | Discharge: 2021-09-06 | Disposition: A | Discharge status: Home / Self Care | Payer: BC Managed Care – PPO | Source: Ambulatory Visit | Attending: Family Medicine | Admitting: Family Medicine

## 2021-09-06 DIAGNOSIS — Z1231 Encounter for screening mammogram for malignant neoplasm of breast: Secondary | ICD-10-CM

## 2021-09-08 NOTE — Progress Notes (Signed)
Hi Annette Phillips  Normal mammogram; repeat in 1 year.  Please let us know if you have any questions.  Thank you,  Merita Norton, FNP

## 2021-09-12 ENCOUNTER — Other Ambulatory Visit: Payer: Self-pay

## 2021-09-12 ENCOUNTER — Ambulatory Visit: Admission: EM | Admit: 2021-09-12 | Discharge: 2021-09-12 | Payer: BC Managed Care – PPO

## 2021-09-12 ENCOUNTER — Observation Stay
Admission: EM | Admit: 2021-09-12 | Discharge: 2021-09-14 | Disposition: A | Discharge status: Home / Self Care | Payer: BC Managed Care – PPO | Attending: Osteopathic Medicine | Admitting: Osteopathic Medicine

## 2021-09-12 ENCOUNTER — Emergency Department: Payer: BC Managed Care – PPO

## 2021-09-12 DIAGNOSIS — D509 Iron deficiency anemia, unspecified: Secondary | ICD-10-CM | POA: Diagnosis not present

## 2021-09-12 DIAGNOSIS — R7989 Other specified abnormal findings of blood chemistry: Secondary | ICD-10-CM | POA: Diagnosis present

## 2021-09-12 DIAGNOSIS — D649 Anemia, unspecified: Secondary | ICD-10-CM | POA: Diagnosis present

## 2021-09-12 DIAGNOSIS — H6691 Otitis media, unspecified, right ear: Secondary | ICD-10-CM | POA: Insufficient documentation

## 2021-09-12 DIAGNOSIS — R0602 Shortness of breath: Secondary | ICD-10-CM | POA: Insufficient documentation

## 2021-09-12 DIAGNOSIS — K649 Unspecified hemorrhoids: Secondary | ICD-10-CM | POA: Diagnosis present

## 2021-09-12 DIAGNOSIS — R55 Syncope and collapse: Secondary | ICD-10-CM | POA: Diagnosis present

## 2021-09-12 DIAGNOSIS — Z20822 Contact with and (suspected) exposure to covid-19: Secondary | ICD-10-CM | POA: Insufficient documentation

## 2021-09-12 DIAGNOSIS — Z79899 Other long term (current) drug therapy: Secondary | ICD-10-CM | POA: Insufficient documentation

## 2021-09-12 DIAGNOSIS — K209 Esophagitis, unspecified without bleeding: Secondary | ICD-10-CM | POA: Diagnosis not present

## 2021-09-12 DIAGNOSIS — H6690 Otitis media, unspecified, unspecified ear: Secondary | ICD-10-CM

## 2021-09-12 DIAGNOSIS — R7402 Elevation of levels of lactic acid dehydrogenase (LDH): Secondary | ICD-10-CM | POA: Diagnosis not present

## 2021-09-12 DIAGNOSIS — R000 Tachycardia, unspecified: Secondary | ICD-10-CM | POA: Diagnosis present

## 2021-09-12 DIAGNOSIS — E039 Hypothyroidism, unspecified: Secondary | ICD-10-CM | POA: Diagnosis present

## 2021-09-12 LAB — CBC WITH DIFFERENTIAL/PLATELET
Abs Immature Granulocytes: 0.02 10*3/uL (ref 0.00–0.07)
Abs Immature Granulocytes: 0.01 10*3/uL (ref 0.00–0.07)
Basophils Absolute: 0 10*3/uL (ref 0.0–0.1)
Basophils Absolute: 0 10*3/uL (ref 0.0–0.1)
Basophils Relative: 0 %
Basophils Relative: 0 %
Eosinophils Absolute: 0 10*3/uL (ref 0.0–0.5)
Eosinophils Absolute: 0 10*3/uL (ref 0.0–0.5)
Eosinophils Relative: 0 %
Eosinophils Relative: 0 %
HCT: 18.4 % — ABNORMAL LOW (ref 36.0–46.0)
HCT: 18.5 % — ABNORMAL LOW (ref 36.0–46.0)
Hemoglobin: 5.3 g/dL — ABNORMAL LOW (ref 12.0–15.0)
Hemoglobin: 5.4 g/dL — ABNORMAL LOW (ref 12.0–15.0)
Immature Granulocytes: 0 %
Immature Granulocytes: 0 %
Lymphocytes Relative: 19 %
Lymphocytes Relative: 17 %
Lymphs Abs: 1.1 10*3/uL (ref 0.7–4.0)
Lymphs Abs: 1.1 10*3/uL (ref 0.7–4.0)
MCH: 25.6 pg — ABNORMAL LOW (ref 26.0–34.0)
MCH: 26 pg (ref 26.0–34.0)
MCHC: 28.8 g/dL — ABNORMAL LOW (ref 30.0–36.0)
MCHC: 29.2 g/dL — ABNORMAL LOW (ref 30.0–36.0)
MCV: 88.9 fL (ref 80.0–100.0)
MCV: 88.9 fL (ref 80.0–100.0)
Monocytes Absolute: 0.5 10*3/uL (ref 0.1–1.0)
Monocytes Absolute: 0.7 10*3/uL (ref 0.1–1.0)
Monocytes Relative: 9 %
Monocytes Relative: 10 %
Neutro Abs: 4.2 10*3/uL (ref 1.7–7.7)
Neutro Abs: 4.7 10*3/uL (ref 1.7–7.7)
Neutrophils Relative %: 72 %
Neutrophils Relative %: 73 %
Platelets: 282 10*3/uL (ref 150–400)
Platelets: 300 10*3/uL (ref 150–400)
RBC: 2.07 MIL/uL — ABNORMAL LOW (ref 3.87–5.11)
RBC: 2.08 MIL/uL — ABNORMAL LOW (ref 3.87–5.11)
RDW: 14 % (ref 11.5–15.5)
RDW: 14 % (ref 11.5–15.5)
WBC: 5.9 10*3/uL (ref 4.0–10.5)
WBC: 6.5 10*3/uL (ref 4.0–10.5)
nRBC: 0.3 % — ABNORMAL HIGH (ref 0.0–0.2)
nRBC: 0 % (ref 0.0–0.2)

## 2021-09-12 LAB — LACTIC ACID, PLASMA
Lactic Acid, Venous: 1.5 mmol/L (ref 0.5–1.9)
Lactic Acid, Venous: 2.3 mmol/L (ref 0.5–1.9)
Lactic Acid, Venous: 1.2 mmol/L (ref 0.5–1.9)

## 2021-09-12 LAB — COMPREHENSIVE METABOLIC PANEL
ALT: 18 U/L (ref 0–44)
AST: 22 U/L (ref 15–41)
Albumin: 3.6 g/dL (ref 3.5–5.0)
Alkaline Phosphatase: 41 U/L (ref 38–126)
Anion gap: 7 (ref 5–15)
BUN: 14 mg/dL (ref 6–20)
CO2: 22 mmol/L (ref 22–32)
Calcium: 8.8 mg/dL — ABNORMAL LOW (ref 8.9–10.3)
Chloride: 105 mmol/L (ref 98–111)
Creatinine, Ser: 0.64 mg/dL (ref 0.44–1.00)
GFR, Estimated: >60 mL/min (ref >60)
Glucose, Bld: 125 mg/dL — ABNORMAL HIGH (ref 70–99)
Potassium: 3.8 mmol/L (ref 3.5–5.1)
Sodium: 134 mmol/L — ABNORMAL LOW (ref 135–145)
Total Bilirubin: 0.4 mg/dL (ref 0.3–1.2)
Total Protein: 6.7 g/dL (ref 6.5–8.1)

## 2021-09-12 LAB — IRON AND TIBC
Iron: 23 ug/dL — ABNORMAL LOW (ref 28–170)
Saturation Ratios: 5 % — ABNORMAL LOW (ref 10.4–31.8)
TIBC: 454 ug/dL — ABNORMAL HIGH (ref 250–450)
UIBC: 431 ug/dL

## 2021-09-12 LAB — URINALYSIS, COMPLETE (UACMP) WITH MICROSCOPIC
Bacteria, UA: NONE SEEN
Bilirubin Urine: NEGATIVE
Glucose, UA: NEGATIVE mg/dL
Hgb urine dipstick: NEGATIVE
Ketones, ur: NEGATIVE mg/dL
Nitrite: NEGATIVE
Protein, ur: NEGATIVE mg/dL
Specific Gravity, Urine: 1.008 (ref 1.005–1.030)
pH: 7 (ref 5.0–8.0)

## 2021-09-12 LAB — RETICULOCYTES
Immature Retic Fract: 21.2 % — ABNORMAL HIGH (ref 2.3–15.9)
RBC.: 2.04 MIL/uL — ABNORMAL LOW (ref 3.87–5.11)
Retic Count, Absolute: 95.8 10*3/uL (ref 19.0–186.0)
Retic Ct Pct: 4.7 % — ABNORMAL HIGH (ref 0.4–3.1)

## 2021-09-12 LAB — ABO/RH: ABO/RH(D): A POS

## 2021-09-12 LAB — TROPONIN I (HIGH SENSITIVITY)
Troponin I (High Sensitivity): 3 ng/L (ref <18)
Troponin I (High Sensitivity): <2 ng/L (ref <18)

## 2021-09-12 LAB — PREPARE RBC (CROSSMATCH)

## 2021-09-12 LAB — PROTIME-INR
INR: 1 (ref 0.8–1.2)
Prothrombin Time: 13.2 seconds (ref 11.4–15.2)

## 2021-09-12 LAB — MAGNESIUM: Magnesium: 2.3 mg/dL (ref 1.7–2.4)

## 2021-09-12 LAB — HEMOGLOBIN AND HEMATOCRIT, BLOOD
HCT: 22.6 % — ABNORMAL LOW (ref 36.0–46.0)
Hemoglobin: 6.9 g/dL — ABNORMAL LOW (ref 12.0–15.0)

## 2021-09-12 LAB — TECHNOLOGIST SMEAR REVIEW: Plt Morphology: NORMAL

## 2021-09-12 LAB — PROCALCITONIN: Procalcitonin: <0.1 ng/mL

## 2021-09-12 LAB — SARS CORONAVIRUS 2 BY RT PCR: SARS Coronavirus 2 by RT PCR: NEGATIVE

## 2021-09-12 LAB — TSH: TSH: 0.146 u[IU]/mL — ABNORMAL LOW (ref 0.350–4.500)

## 2021-09-12 LAB — CBG MONITORING, ED: Glucose-Capillary: 121 mg/dL — ABNORMAL HIGH (ref 70–99)

## 2021-09-12 MED ORDER — ONDANSETRON HCL 4 MG/2ML IJ SOLN
4.0000 mg | Freq: Four times a day (QID) | INTRAMUSCULAR | Status: DC | PRN
Start: 2021-09-12 — End: 2021-09-14
  Administered 2021-09-13: 4 mg via INTRAVENOUS
  Filled 2021-09-12: qty 2

## 2021-09-12 MED ORDER — ACETAMINOPHEN 325 MG PO TABS
650.0000 mg | ORAL_TABLET | Freq: Four times a day (QID) | ORAL | Status: DC | PRN
  Administered 2021-09-13 (×2): 650 mg via ORAL
  Filled 2021-09-12 (×2): qty 2

## 2021-09-12 MED ORDER — LEVOTHYROXINE SODIUM 100 MCG PO TABS: 100.0000 ug | ORAL_TABLET | Freq: Every day | ORAL | Status: DC

## 2021-09-12 MED ORDER — LEVOTHYROXINE SODIUM 50 MCG PO TABS: 100.0000 ug | ORAL_TABLET | Freq: Every day | ORAL | Status: DC

## 2021-09-12 MED ORDER — CEPHALEXIN 500 MG PO CAPS
500.0000 mg | ORAL_CAPSULE | Freq: Once | ORAL | Status: AC
  Administered 2021-09-12: 500 mg via ORAL
  Filled 2021-09-12: qty 1

## 2021-09-12 MED ORDER — SODIUM CHLORIDE 0.9% IV SOLUTION
Freq: Once | INTRAVENOUS | Status: DC
  Filled 2021-09-12: qty 250

## 2021-09-12 MED ORDER — ONDANSETRON HCL 4 MG PO TABS: 4.0000 mg | ORAL_TABLET | Freq: Four times a day (QID) | ORAL | Status: DC | PRN

## 2021-09-12 MED ORDER — LACTATED RINGERS IV BOLUS: 1000.0000 mL | Freq: Once | INTRAVENOUS | Status: DC

## 2021-09-12 MED ORDER — SODIUM CHLORIDE 0.9 % IV SOLN
100.0000 mg | Freq: Once | INTRAVENOUS | Status: AC
  Administered 2021-09-12: 100 mg via INTRAVENOUS
  Filled 2021-09-12: qty 5

## 2021-09-12 MED ORDER — ACETAMINOPHEN 650 MG RE SUPP: 650.0000 mg | Freq: Four times a day (QID) | RECTAL | Status: DC | PRN

## 2021-09-12 MED ORDER — METOPROLOL TARTRATE 5 MG/5ML IV SOLN: 2.5000 mg | INTRAVENOUS | Status: DC | PRN

## 2021-09-12 NOTE — ED Notes (Signed)
Patient is being discharged from the Urgent Care and sent to the Emergency Department via person vehicle. Per Wendee Beavers NP, patient is in need of higher level of care due to dizziness and fatigue. Patient is aware and verbalizes understanding of plan of care.  Vitals:   09/12/21 1508  BP: 125/75  Pulse: (!) 131  Resp: 18  Temp: 98.8 F (37.1 C)  SpO2: 96%

## 2021-09-12 NOTE — ED Triage Notes (Signed)
Patient to Er via POV with complaints of dizziness, shortness of breath, ear pressure right side only), and lightheadedness. Reports episode of near syncope this morning when showering. Reports that she has been feeling poorly for 3 days with no improvement in symptoms.

## 2021-09-12 NOTE — Hospital Course (Signed)
[name] is a 56 year old female with history of hypothyroid, who presents emergency department from urgent care on 09/12/2021 for chief concerns of dizziness and shortness of breath and near syncope, right ear pressure over the last 2 days. 07/30: tachycardic 140, hemoglobin 5.3, platelets 282. Lactic acid was 1.5 --> 2.3. ED treatment: LR 1 L bolus, 2 units of PRBC.  Admitted to hospitalist service for symptomatic anemia, iron deficiency, received iron sucrose 100 mg IV, checking H. pylori antigen, GI consultation placed (Dr. Allegra Lai).  Of note, hemoglobin 2 years ago (02/2019) was 10.5-11.5, iron and TIBC as well as ferritin were in normal range at that time. 07/31: Remains mildly tachycardic but improved, lactic acid down to 1.2, hemoglobin 7.7 status post 2 units PRBC, given tachycardia another unit ordered for symptomatic anemia. H. pylori labs in process. GI to see

## 2021-09-12 NOTE — Assessment & Plan Note (Addendum)
-   Synthroid 100 mcg nightly - Pharmacy consulted for medication relabel as patient states she absolutely has to have Synthroid - Discussed with pharmacy over the phone and with nursing staff

## 2021-09-12 NOTE — ED Notes (Signed)
Patient took her home medication of Synthroid 100 mcg PO. Patient states she is unable to take the generic alternative and informed Dr. Sedalia Muta of this earlier tonight. Dr. Sedalia Muta told patient that if she was able to get her home medication brought to the hospital, she can take the Synthroid. Dr. Sedalia Muta informed this RN of the plan as well prior to her leaving. Patient's daughter Luther Parody) brought her Synthroid and patient took medication.

## 2021-09-12 NOTE — Assessment & Plan Note (Signed)
-   Presumed secondary to anemia - Recheck lactic acid scheduled

## 2021-09-12 NOTE — Assessment & Plan Note (Addendum)
-   Iron deficiency anemia - Iron sucrose 100 mg IV, one-time dose ordered - Continue 2 units of PRBC ordered for transfusion - Technologist has been consulted to evaluate blood smears - Check H. pylori antigen and IgM, IgG - Goal hemoglobin is more than 8 - Nursing communication to ensure to PIV to be maintained, large-bore if possible - GI consultation placed for Dr. Allegra Lai - Clear liquids at this time - N.p.o. after midnight

## 2021-09-12 NOTE — ED Triage Notes (Signed)
Pt presents dizziness, near syncope, fatigue, right side HA, and ear pressure since this morning.   Pt was unsteady on her feet and feels thirsty although she has been drinking fluids today. She felt like passing out in the shower today.

## 2021-09-12 NOTE — H&P (Addendum)
History and Physical   Annette Phillips AVW:[id number] DOB: 1965-09-30 DOA: 09/12/2021  PCP: Erasmo Downer, MD  Patient coming from: urgent care center via pov  I have personally briefly reviewed patient's old medical records in [facility] EMR.  Chief Concern: Dizziness, near syncope and fatigue  HPI: [name] is a 56 year old female with history of hypothyroid, who presents emergency department for chief concerns of dizziness and shortness of breath and near syncope.  Patient also endorses right ear pressure over the last 2 days.  Patient initially presented to urgent care center and was sent to the emergency department for further evaluation.  Initial vitals in the emergency department showed temperature of 99.1, respiration rate of 18, heart rate of 140, blood pressure 126/77, SPO2 of 100% on room air.  Serum sodium is 134, potassium 3.8, chloride 105, bicarb 22, BUN of 14, serum creatinine of 0.64, GFR of greater than 60, nonfasting blood glucose 125, WBC 5.9, hemoglobin 5.3, platelets of 282.  Lactic acid was 1.5.  ED treatment: LR 1 L bolus, 2 units of PRBC has been ordered.  At bedside, she was able to tell me her name, age.   She states that she came from urgent care center due to anemia.  She states that she developed dizziness, near syncope, fatigue in the a.m.  She reports she is never felt this way before.  She endorses being unsteady on her feet and felt like she was going to pass out while taking a shower.  She denies regular NSAID use. She infrequently takes motrin. She denies ibuprofen, BC powder, Goody Powder, advil. She denies etoh. She is no longing menstruating.  She had abdominal pain and had elective cholecystectomy in January 2021. She reports she last had a colonscopy and endoscopy in January 2021 and was told that she had hemorrhoids.  She last used ibuprofen last week, one time (400 mg) for right shoulder pain.  Social history: She lives by  herself at home. She denies tobacco, etoh, and recreational drug use. 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   ROS: Constitutional: no weight change, no fever ENT/Mouth: no sore throat, no rhinorrhea Eyes: no eye pain, no vision changes Cardiovascular: no chest pain, no dyspnea,  no edema, no palpitations Respiratory: no cough, no sputum, no wheezing Gastrointestinal: no nausea, no vomiting, no diarrhea, no constipation Genitourinary: no urinary incontinence, no dysuria, no hematuria Musculoskeletal: no arthralgias, no myalgias Skin: no skin lesions, no pruritus, Neuro: + weakness, no loss of consciousness, no syncope Psych: no anxiety, no depression, + decrease appetite Heme/Lymph: no bruising, no bleeding  ED Course: Discussed with emergency medicine provider, patient requiring hospitalization for chief concerns of anemia.  Assessment/Plan  Principal Problem:   Symptomatic anemia Active Problems:   Hypothyroid   Esophagitis   Hemorrhoids   Sinus tachycardia   Elevated lactic acid level   Assessment and Plan:  * Symptomatic anemia - Iron deficiency anemia - Iron sucrose 100 mg IV, one-time dose ordered - Continue 2 units of PRBC ordered for transfusion - Technologist has been consulted to evaluate blood smears - Check H. pylori antigen and IgM, IgG - Goal hemoglobin is more than 8 - Nursing communication to ensure to PIV to be maintained, large-bore if possible - GI consultation placed for Dr. Allegra Lai - Clear liquids at this time - N.p.o. after midnight  Elevated lactic acid level - Presumed secondary to anemia - Recheck lactic  acid scheduled  Sinus tachycardia - Secondary to anemia - Metoprolol tartrate 2.5 mg IV every 2 hours.  For heart rate greater than 120, 4 doses ordered  Hypothyroid - Synthroid 100 mcg nightly - Pharmacy consulted for medication relabel as patient states she absolutely has to have  Synthroid - Discussed with pharmacy over the phone and with nursing staff  DVT prophylaxis-I have not initiated pharmacologic DVT prophylaxis on admission due to patient presenting with symptomatic anemia - AM team to initiate pharmacologic DVT prophylaxis when the benefits outweigh the risk  Chart reviewed.   DVT prophylaxis: TED hose Code Status: full code  Diet: Clear liquids; n.p.o. after midnight Family Communication: updated son at bedside with patient's permission Disposition Plan: Pending clinical course Consults called: gi Admission status: Telemetry medical, observation  Past Medical History:  Diagnosis Date   Goiter    Hypothyroid    Prediabetes    Recurrent cold sores    Past Surgical History:  Procedure Laterality Date   APPENDECTOMY     BREAST BIOPSY Right 2015   benign   CHOLECYSTECTOMY N/A 02/28/2019   Procedure: LAPAROSCOPIC CHOLECYSTECTOMY;  Surgeon: Henrene Dodge, MD;  Location: [facility] ORS;  Service: General;  Laterality: N/A;   ESOPHAGOGASTRODUODENOSCOPY (EGD) WITH PROPOFOL N/A 02/27/2019   Procedure: ESOPHAGOGASTRODUODENOSCOPY (EGD) WITH PROPOFOL;  Surgeon: Toney Reil, MD;  Location: [facility] ENDOSCOPY;  Service: Gastroenterology;  Laterality: N/A;   KNEE ARTHROSCOPY Left 2008   torn meniscus   Social History:  reports that she has never smoked. She has never used smokeless tobacco. She reports current alcohol use. She reports that she does not use drugs.  Allergies  Allergen Reactions   Aspirin Other (See Comments)   Penicillin G Other (See Comments)   Family History  Problem Relation Age of Onset   Thyroid disease Mother    Diabetes Mellitus II Mother    Thyroid disease Father    Healthy Sister    Hypothyroidism Daughter    Hypothyroidism Son    Diabetes Mellitus I Son    Kidney cancer Maternal Grandmother    Lung cancer Maternal Grandfather        smoker   Colon cancer Neg Hx    Breast cancer Neg Hx    Family history: Family history  reviewed and not pertinent  Prior to Admission medications   Medication Sig Start Date End Date Taking? Authorizing Provider  levothyroxine (SYNTHROID) 100 MCG tablet Take 1 tablet (100 mcg total) by mouth daily before breakfast. 07/26/21   Bacigalupo, Marzella Schlein, MD  Multiple Vitamin (MULTI-VITAMINS) TABS Take 1 tablet by mouth daily.     [provider]   Physical Exam: Vitals:   09/12/21 1813 09/12/21 1829 09/12/21 1933 09/12/21 2000  BP: 130/80 113/79 131/87 135/86  Pulse: (!) 115 (!) 127 (!) 130 (!) 134  Resp: 10 14 19 16   Temp: 98.6 F (37 C) 98.3 F (36.8 C) 98.5 F (36.9 C)   TempSrc: Oral Oral Oral   SpO2:  100% 100% 99%  Weight:      Height:       Constitutional: appears age-appropriate, NAD, calm, comfortable Eyes: PERRL, lids and conjunctivae normal ENMT: Mucous membranes are moist. Posterior pharynx clear of any exudate or lesions. Age-appropriate dentition. Hearing appropriate Neck: normal, supple, no masses, no thyromegaly Respiratory: clear to auscultation bilaterally, no wheezing, no crackles. Normal respiratory effort. No accessory muscle use.  Cardiovascular: Increased heart rate, regular rhythm, no murmurs / rubs / gallops. No extremity edema. 2+  pedal pulses. No carotid bruits.  Abdomen: no tenderness, no masses palpated, no hepatosplenomegaly. Bowel sounds positive.  Musculoskeletal: no clubbing / cyanosis. No joint deformity upper and lower extremities. Good ROM, no contractures, no atrophy. Normal muscle tone.  Skin: no rashes, lesions, ulcers. No induration.  Patient appears pale Neurologic: Sensation intact. Strength 5/5 in all 4.  Psychiatric: Normal judgment and insight. Alert and oriented x 3. Normal mood.   EKG: independently reviewed, showing sinus tachycardia with rate of 142, QTc 458  Chest x-ray on Admission: I personally reviewed and I agree with radiologist reading as below.  DG Chest 2 View  Result Date: 09/12/2021 CLINICAL DATA:   Shortness of breath. EXAM: CHEST - 2 VIEW COMPARISON:  None Available. FINDINGS: The cardiomediastinal silhouette is unremarkable. There is no evidence of focal airspace disease, pulmonary edema, suspicious pulmonary nodule/mass, pleural effusion, or pneumothorax. No acute bony abnormalities are identified. IMPRESSION: No active cardiopulmonary disease. Electronically Signed   By: Harmon Pier M.D.   On: 09/12/2021 16:35    Labs on Admission: I have personally reviewed following labs  CBC: Recent Labs  Lab 09/12/21 1544 09/12/21 1726  WBC 5.9 6.5  NEUTROABS 4.2 4.7  HGB 5.3* 5.4*  HCT 18.4* 18.5*  MCV 88.9 88.9  PLT 282 300   Basic Metabolic Panel: Recent Labs  Lab 09/12/21 1544  NA 134*  K 3.8  CL 105  CO2 22  GLUCOSE 125*  BUN 14  CREATININE 0.64  CALCIUM 8.8*  MG 2.3   GFR: Estimated Creatinine Clearance: 82.9 mL/min (by C-G formula based on SCr of 0.64 mg/dL).  Liver Function Tests: Recent Labs  Lab 09/12/21 1544  AST 22  ALT 18  ALKPHOS 41  BILITOT 0.4  PROT 6.7  ALBUMIN 3.6   CBG: Recent Labs  Lab 09/12/21 1526  GLUCAP 121*   Thyroid Function Tests: Recent Labs    09/12/21 1544  TSH 0.146*   Anemia Panel: Recent Labs    09/12/21 1544  TIBC 454*  IRON 23*  RETICCTPCT 4.7*   Urine analysis:    Component Value Date/Time   COLORURINE YELLOW (A) 09/12/2021 1544   APPEARANCEUR HAZY (A) 09/12/2021 1544   LABSPEC 1.008 09/12/2021 1544   PHURINE 7.0 09/12/2021 1544   GLUCOSEU NEGATIVE 09/12/2021 1544   HGBUR NEGATIVE 09/12/2021 1544   BILIRUBINUR NEGATIVE 09/12/2021 1544   KETONESUR NEGATIVE 09/12/2021 1544   PROTEINUR NEGATIVE 09/12/2021 1544   NITRITE NEGATIVE 09/12/2021 1544   LEUKOCYTESUR TRACE (A) 09/12/2021 1544   Dr. Sedalia Muta [facility]  If 7PM-7AM, please contact overnight-coverage provider If 7AM-7PM, please contact day coverage provider [email or web address]  09/12/2021, 8:33 PM

## 2021-09-12 NOTE — ED Provider Notes (Signed)
[facility] Provider Note    Event Date/Time   First MD Initiated Contact with Patient 09/12/21 1536     (approximate)   History   Dizziness and Shortness of Breath   HPI  Annette Phillips is a 56 y.o. female with past medical history of recurrent cold sores, prediabetes, hypothyroidism referred to the emergency room from urgent care for further evaluation management of lightheadedness, headache, fatigue, ear pressure, as well as some shortness of breath over the last 2 to 3 days.  No chest pain, cough, fever, abdominal pain, nausea, vomiting, back pain, diarrhea, urinary symptoms, rash or any focal extremity weakness numbness or tingling.  No recent falls or injuries.  Patient denies any tobacco use, EtOH use or illicit drug use.  He states the symptoms have some bright red bleeding when she is passing but otherwise brown stools and has been ongoing issue on and off for weeks if not months and is not any different over the last couple days.  She denies any vaginal bleeding hematemesis.  She has not any blood thinners.    Past Medical History:  Diagnosis Date   Goiter    Hypothyroid    Prediabetes    Recurrent cold sores      Physical Exam  Triage Vital Signs: ED Triage Vitals  Enc Vitals Group     BP 09/12/21 1527 126/77     Pulse Rate 09/12/21 1527 (!) 140     Resp 09/12/21 1527 18     Temp 09/12/21 1527 99.1 F (37.3 C)     Temp Source 09/12/21 1527 Oral     SpO2 09/12/21 1527 100 %     Weight 09/12/21 1527 165 lb (74.8 kg)     Height 09/12/21 1527 5\' 7"  (1.702 m)     Head Circumference --      Peak Flow --      Pain Score 09/12/21 1541 5     Pain Loc --      Pain Edu? --      Excl. in GC? --     Most recent vital signs: Vitals:   09/12/21 1527 09/12/21 1630  BP: 126/77 131/86  Pulse: (!) 140 (!) 118  Resp: 18 14  Temp: 99.1 F (37.3 C)   SpO2: 100% 100%    General: Awake, no distress.  CV:  Prolonged capillary fill in the digits..   2+ radial pulses.  Tachycardic. Resp:  Normal effort.  Clear bilaterally Abd:  No distention.  Soft. Other:  Cranial nerves II through XII grossly intact.  No pronator drift.  No finger dysmetria.  Symmetric 5/5 strength of all extremities.  Sensation intact to light touch in all extremities.  Unremarkable unassisted gait.  Patient is quite pale.  Some mild bulging of the right TM.  No significant effusion seen.  There is some bulging in the left.  Oropharynx is unremarkable.  Chaperoned rectal exam shows nonbleeding external hemorrhoids and skin tags.  Patient is Hemoccult positive.   ED Results / Procedures / Treatments  Labs (all labs ordered are listed, but only abnormal results are displayed) Labs Reviewed  CBC WITH DIFFERENTIAL/PLATELET - Abnormal; Notable for the following components:      Result Value   RBC 2.07 (*)    Hemoglobin 5.3 (*)    HCT 18.4 (*)    MCH 25.6 (*)    MCHC 28.8 (*)    nRBC 0.3 (*)    All other components within normal  limits  COMPREHENSIVE METABOLIC PANEL - Abnormal; Notable for the following components:   Sodium 134 (*)    Glucose, Bld 125 (*)    Calcium 8.8 (*)    All other components within normal limits  TSH - Abnormal; Notable for the following components:   TSH 0.146 (*)    All other components within normal limits  RETICULOCYTES - Abnormal; Notable for the following components:   Retic Ct Pct 4.7 (*)    RBC. 2.04 (*)    Immature Retic Fract 21.2 (*)    All other components within normal limits  IRON AND TIBC - Abnormal; Notable for the following components:   Iron 23 (*)    TIBC 454 (*)    Saturation Ratios 5 (*)    All other components within normal limits  CBG MONITORING, ED - Abnormal; Notable for the following components:   Glucose-Capillary 121 (*)    All other components within normal limits  SARS CORONAVIRUS 2 BY RT PCR  MAGNESIUM  LACTIC ACID, PLASMA  URINALYSIS, COMPLETE (UACMP) WITH MICROSCOPIC  LACTIC ACID, PLASMA   PROTIME-INR  PROCALCITONIN  T4, FREE  PREPARE RBC (CROSSMATCH)  TYPE AND SCREEN  ABO/RH  TROPONIN I (HIGH SENSITIVITY)     EKG  EKG is remarkable sinus tachycardia with ventricular rate of 142, normal axis with nonspecific changes versus artifact in aVL without other clear evidence of acute arrhythmia or ischemia.  RADIOLOGY Chest reviewed by myself shows no focal consoidation, effusion, edema, pneumothorax or other clear acute thoracic process. I also reviewed radiology interpretation and agree with findings described.    PROCEDURES:  Critical Care performed: Yes, see critical care procedure note(s)  .Critical Care  Performed by: Gilles Chiquito, MD Authorized by: Gilles Chiquito, MD   Critical care provider statement:    Critical care time (minutes):  30   Critical care was necessary to treat or prevent imminent or life-threatening deterioration of the following conditions:  Circulatory failure   Critical care was time spent personally by me on the following activities:  Development of treatment plan with patient or surrogate, discussions with consultants, evaluation of patient's response to treatment, examination of patient, ordering and review of laboratory studies, ordering and review of radiographic studies, ordering and performing treatments and interventions, pulse oximetry, re-evaluation of patient's condition and review of old charts    MEDICATIONS ORDERED IN ED: Medications  0.9 %  sodium chloride infusion (Manually program via Guardrails IV Fluids) (0 mLs Intravenous Hold 09/12/21 1636)  cephALEXin (KEFLEX) capsule 500 mg (has no administration in time range)     IMPRESSION / MDM / ASSESSMENT AND PLAN / ED COURSE  I reviewed the triage vital signs and the nursing notes. Patient's presentation is most consistent with acute presentation with potential threat to life or bodily function.                               Differential diagnosis includes, but is  not limited to otitis media, primary migraine headache, metabolic derangement, anemia, pneumonia, arrhythmia, CHF, PE, dehydration.  EKG is remarkable sinus tachycardia with ventricular rate of 142, normal axis with nonspecific changes versus artifact in aVL without other clear evidence of acute arrhythmia or ischemia.  Chest reviewed by myself shows no focal consoidation, effusion, edema, pneumothorax or other clear acute thoracic process. I also reviewed radiology interpretation and agree with findings described.  CBC shows WBC count 5.9 hemoglobin of  5.3 with normal platelets.  CMP without any significant lecture light or metabolic derangements.  Magnesium 2.3.  TSH 0.146.  Free T4 sent.  Lactic acid nonelevated.  Troponin is nonelevated and overall not suggestive of ACS.  COVID PCR is negative.  Iron panel consistent with iron deficiency anemia.  Reticulocyte count appropriate and not suggestive of aplastic anemia.  I suspect patient's shortness of breath fatigue and lightheadedness are all related to acute symptomatic anemia.  I suspect likely GI source.  We will give 2 units of PRBCs.  She also has what appears to be early otitis B on the right.  There is some bulging of the TM with faint erythema around it and while there is no significant serous fluid visible given she has had 5 or 6 days of symptoms and pressure here think is reasonable to treat with a short course of Keflex.  Otherwise I have low suspicion for other deep space infection of the head or neck or sepsis at this time.  I will admit to medicine service for further evaluation and management.     FINAL CLINICAL IMPRESSION(S) / ED DIAGNOSES   Final diagnoses:  Symptomatic anemia  Acute otitis media, unspecified otitis media type     Rx / DC Orders   ED Discharge Orders     None        Note:  This document was prepared using Dragon voice recognition software and may include unintentional dictation errors.   Gilles Chiquito, MD 09/12/21 [phone]

## 2021-09-12 NOTE — Assessment & Plan Note (Signed)
-   Secondary to anemia - Metoprolol tartrate 2.5 mg IV every 2 hours.  For heart rate greater than 120, 4 doses ordered

## 2021-09-13 DIAGNOSIS — D649 Anemia, unspecified: Secondary | ICD-10-CM

## 2021-09-13 LAB — CBC
HCT: 24.8 % — ABNORMAL LOW (ref 36.0–46.0)
Hemoglobin: 7.7 g/dL — ABNORMAL LOW (ref 12.0–15.0)
MCH: 27.1 pg (ref 26.0–34.0)
MCHC: 31 g/dL (ref 30.0–36.0)
MCV: 87.3 fL (ref 80.0–100.0)
Platelets: 249 10*3/uL (ref 150–400)
RBC: 2.84 MIL/uL — ABNORMAL LOW (ref 3.87–5.11)
RDW: 13.8 % (ref 11.5–15.5)
WBC: 6.1 10*3/uL (ref 4.0–10.5)
nRBC: 0.3 % — ABNORMAL HIGH (ref 0.0–0.2)

## 2021-09-13 LAB — BASIC METABOLIC PANEL
Anion gap: 6 (ref 5–15)
BUN: 9 mg/dL (ref 6–20)
CO2: 22 mmol/L (ref 22–32)
Calcium: 8.4 mg/dL — ABNORMAL LOW (ref 8.9–10.3)
Chloride: 108 mmol/L (ref 98–111)
Creatinine, Ser: 0.7 mg/dL (ref 0.44–1.00)
GFR, Estimated: >60 mL/min (ref >60)
Glucose, Bld: 96 mg/dL (ref 70–99)
Potassium: 4 mmol/L (ref 3.5–5.1)
Sodium: 136 mmol/L (ref 135–145)

## 2021-09-13 LAB — PREPARE RBC (CROSSMATCH)

## 2021-09-13 LAB — HIV ANTIBODY (ROUTINE TESTING W REFLEX): HIV Screen 4th Generation wRfx: NONREACTIVE

## 2021-09-13 LAB — T4, FREE: Free T4: 0.92 ng/dL (ref 0.61–1.12)

## 2021-09-13 LAB — PROCALCITONIN: Procalcitonin: <0.1 ng/mL

## 2021-09-13 LAB — GLUCOSE, CAPILLARY: Glucose-Capillary: 84 mg/dL (ref 70–99)

## 2021-09-13 MED ORDER — ACETAMINOPHEN 10 MG/ML IV SOLN
1000.0000 mg | Freq: Once | INTRAVENOUS | Status: AC
Start: 2021-09-13 — End: 2021-09-14
  Filled 2021-09-13: qty 100

## 2021-09-13 MED ORDER — LEVOTHYROXINE SODIUM 100 MCG PO TABS: 100.0000 ug | ORAL_TABLET | Freq: Every day | ORAL | Status: DC

## 2021-09-13 MED ORDER — SODIUM CHLORIDE 0.9 % IV SOLN: INTRAVENOUS | Status: DC

## 2021-09-13 MED ORDER — POLYETHYLENE GLYCOL 3350 17 GM/SCOOP PO POWD
1.0000 | Freq: Once | ORAL | Status: AC
  Administered 2021-09-13: 255 g via ORAL
  Filled 2021-09-13: qty 255

## 2021-09-13 MED ORDER — LEVOTHYROXINE SODIUM 100 MCG PO TABS
100.0000 ug | ORAL_TABLET | Freq: Every day | ORAL | Status: DC
  Administered 2021-09-13: 100 ug via ORAL
  Filled 2021-09-13 (×2): qty 1

## 2021-09-13 NOTE — ED Notes (Signed)
RN aware bed assigned ?

## 2021-09-13 NOTE — Progress Notes (Signed)
Admission profile updated. ?

## 2021-09-13 NOTE — Progress Notes (Signed)
       CROSS COVER NOTE  NAME: Chaeli Judy MRN: [id number] DOB : September 25, 1965    Date of Service   09/13/21  HPI/Events of Note   Notified by nursing staff of multiple stools that were bloody per pt report (nursing did not see stool) during bowel prep tonight for endoscopy tomorrow. Stools are now clear. Also with ongoing weakness, fatigue, and tachycardia HR 123.  Interventions   Plan: H/H - Hemoglobin 10.4 Hematocrit 32.6 Continue PRN metoprolol for HR >120     This document was prepared using Dragon voice recognition software and may include unintentional dictation errors.  Bishop Limbo DNP, MHA, FNP-BC Nurse Practitioner [facility] Pager [phone]

## 2021-09-13 NOTE — Progress Notes (Signed)
PROGRESS NOTE    Annette Phillips  NID:[id number] DOB: 06-29-1965  DOA: 09/12/2021 Date of Service: 09/13/21 PCP: Erasmo Downer, MD     Brief Narrative / Hospital Course:  Annette Phillips is a 56 year old female with history of hypothyroid, who presents emergency department from urgent care on 09/12/2021 for chief concerns of dizziness and shortness of breath and near syncope, right ear pressure over the last 2 days. 07/30: tachycardic 140, hemoglobin 5.3, platelets 282. Lactic acid was 1.5 --> 2.3. ED treatment: LR 1 L bolus, 2 units of PRBC.  Admitted to hospitalist service for symptomatic anemia, iron deficiency, received iron sucrose 100 mg IV, checking H. pylori antigen, GI consultation placed (Dr. Allegra Lai).  Of note, hemoglobin 2 years ago (02/2019) was 10.5-11.5, iron and TIBC as well as ferritin were in normal range at that time. 07/31: Remains mildly tachycardic but improved, lactic acid down to 1.2, hemoglobin 7.7 status post 2 units PRBC, given tachycardia another unit ordered for symptomatic anemia. H. pylori labs in process. GI to see    Consultants:  Gastro   Procedures: none    Subjective: Patient reports she is hungry, this is only complaint. No CP/SOB, no dizziness,lightheaded, no bleeding      ASSESSMENT & PLAN:   Principal Problem:   Symptomatic anemia Active Problems:   Hypothyroid   Esophagitis   Hemorrhoids   Sinus tachycardia   Elevated lactic acid level   Symptomatic anemia - Iron deficiency anemia likely chronic however sudden onset symptoms concern for more acute drop  - Iron sucrose 100 mg IV, one-time dose ordered - s/p 3 units of PRBC to reach goal hemoglobin is more than 8 - Check H. pylori antigen and IgM, IgG - GI consultation placed for Dr. Allegra Lai - Dr Servando Snare to see today  - Clear liquids at this time  Hypothyroid - Synthroid 100 mcg nightly - Pharmacy consulted for medication relabel as patient states she absolutely has to have  Synthroid - Discussed with pharmacy over the phone and with nursing staff  Sinus tachycardia - Secondary to anemia - Metoprolol tartrate 2.5 mg IV every 2 hours.  For heart rate greater than 120, 4 doses ordered  Elevated lactic acid level - Presumed secondary to anemia - Recheck lactic acid scheduled    DVT prophylaxis: TED hose Code Status: FULL Family Communication: family art bedside on rounds  Disposition Plan / TOC needs: Likely discharge back to previous home environment, pending clinical Barriers to discharge / significant pending items:              Objective: Vitals:   09/13/21 0901 09/13/21 1538 09/13/21 1557 09/13/21 1626  BP: 125/85 125/90 132/82 131/89  Pulse: 100 (!) 110 (!) 103 (!) 105  Resp: 18 18 19 17   Temp: 98.3 F (36.8 C) 98.4 F (36.9 C)  97.9 F (36.6 C)  TempSrc: Oral Oral Oral Oral  SpO2: 99% 99% 98% 99%  Weight:      Height:        Intake/Output Summary (Last 24 hours) at 09/13/2021 1837 Last data filed at 09/13/2021 0150 Gross per 24 hour  Intake 765 ml  Output --  Net 765 ml   Filed Weights   09/12/21 1527 09/12/21 1541  Weight: 74.8 kg 74.8 kg    Examination:  Constitutional:  VS as above General Appearance: alert, well-developed, well-nourished, NAD Eyes: Normal lids and conjunctive, non-icteric sclera Ears, Nose, Mouth, Throat: Normal appearance Neck: No masses, trachea midline Respiratory:  Normal respiratory effort Breath sounds normal, no wheeze/rhonchi/rales Cardiovascular: S1/S2 normal, tachy regular  No lower extremity edema Gastrointestinal: Nontender, no masses No hepatomegaly, no splenomegaly No hernia appreciated Musculoskeletal:  No clubbing/cyanosis of digits Neurological: No cranial nerve deficit on limited exam Psychiatric: Normal judgment/insight Normal mood and affect       Scheduled Medications:   sodium chloride   Intravenous Once   [START ON 09/14/2021] levothyroxine  100 mcg  Oral Q0600   polyethylene glycol powder  1 Container Oral Once    Continuous Infusions:  sodium chloride     acetaminophen      PRN Medications:  acetaminophen **OR** acetaminophen, metoprolol tartrate, ondansetron **OR** ondansetron (ZOFRAN) IV  Antimicrobials:  Anti-infectives (From admission, onward)    Start     Dose/Rate Route Frequency Ordered Stop   09/12/21 1700  cephALEXin (KEFLEX) capsule 500 mg        500 mg Oral  Once 09/12/21 1652 09/12/21 1727       Data Reviewed: I have personally reviewed following labs and imaging studies  CBC: Recent Labs  Lab 09/12/21 1544 09/12/21 1726 09/12/21 2108 09/13/21 0153  WBC 5.9 6.5  --  6.1  NEUTROABS 4.2 4.7  --   --   HGB 5.3* 5.4* 6.9* 7.7*  HCT 18.4* 18.5* 22.6* 24.8*  MCV 88.9 88.9  --  87.3  PLT 282 300  --  249   Basic Metabolic Panel: Recent Labs  Lab 09/12/21 1544 09/13/21 0521  NA 134* 136  K 3.8 4.0  CL 105 108  CO2 22 22  GLUCOSE 125* 96  BUN 14 9  CREATININE 0.64 0.70  CALCIUM 8.8* 8.4*  MG 2.3  --    GFR: Estimated Creatinine Clearance: 82.9 mL/min (by C-G formula based on SCr of 0.7 mg/dL). Liver Function Tests: Recent Labs  Lab 09/12/21 1544  AST 22  ALT 18  ALKPHOS 41  BILITOT 0.4  PROT 6.7  ALBUMIN 3.6   No results for input(s): "LIPASE", "AMYLASE" in the last 168 hours. No results for input(s): "AMMONIA" in the last 168 hours. Coagulation Profile: Recent Labs  Lab 09/12/21 1724  INR 1.0   Cardiac Enzymes: No results for input(s): "CKTOTAL", "CKMB", "CKMBINDEX", "TROPONINI" in the last 168 hours. BNP (last 3 results) No results for input(s): "PROBNP" in the last 8760 hours. HbA1C: No results for input(s): "HGBA1C" in the last 72 hours. CBG: Recent Labs  Lab 09/12/21 1526 09/13/21 0902  GLUCAP 121* 84   Lipid Profile: No results for input(s): "CHOL", "HDL", "LDLCALC", "TRIG", "CHOLHDL", "LDLDIRECT" in the last 72 hours. Thyroid Function Tests: Recent Labs     09/12/21 1544 09/12/21 1724  TSH 0.146*  --   FREET4  --  0.92   Anemia Panel: Recent Labs    09/12/21 1544  TIBC 454*  IRON 23*  RETICCTPCT 4.7*   Urine analysis:    Component Value Date/Time   COLORURINE YELLOW (A) 09/12/2021 1544   APPEARANCEUR HAZY (A) 09/12/2021 1544   LABSPEC 1.008 09/12/2021 1544   PHURINE 7.0 09/12/2021 1544   GLUCOSEU NEGATIVE 09/12/2021 1544   HGBUR NEGATIVE 09/12/2021 1544   BILIRUBINUR NEGATIVE 09/12/2021 1544   KETONESUR NEGATIVE 09/12/2021 1544   PROTEINUR NEGATIVE 09/12/2021 1544   NITRITE NEGATIVE 09/12/2021 1544   LEUKOCYTESUR TRACE (A) 09/12/2021 1544   Sepsis Labs: @LABRCNTIP (procalcitonin:4,lacticidven:4)  Recent Results (from the past 240 hour(s))  SARS Coronavirus 2 by RT PCR (hospital order, performed in [facility] hospital lab) *cepheid single result  test* Anterior Nasal Swab     Status: None   Collection Time: 09/12/21  3:44 PM   Specimen: Anterior Nasal Swab  Result Value Ref Range Status   SARS Coronavirus 2 by RT PCR NEGATIVE NEGATIVE Final    Comment: (NOTE) SARS-CoV-2 target nucleic acids are NOT DETECTED.  The SARS-CoV-2 RNA is generally detectable in upper and lower respiratory specimens during the acute phase of infection. The lowest concentration of SARS-CoV-2 viral copies this assay can detect is 250 copies / mL. A negative result does not preclude SARS-CoV-2 infection and should not be used as the sole basis for treatment or other patient management decisions.  A negative result may occur with improper specimen collection / handling, submission of specimen other than nasopharyngeal swab, presence of viral mutation(s) within the areas targeted by this assay, and inadequate number of viral copies (<250 copies / mL). A negative result must be combined with clinical observations, patient history, and epidemiological information.  Fact Sheet for Patients:   RoadLapTop.co.za  Fact Sheet  for Healthcare Providers: http://kim-miller.com/  This test is not yet approved or  cleared by the [place] FDA and has been authorized for detection and/or diagnosis of SARS-CoV-2 by FDA under an Emergency Use Authorization (EUA).  This EUA will remain in effect (meaning this test can be used) for the duration of the COVID-19 declaration under Section 564(b)(1) of the Act, 21 U.S.C. section 360bbb-3(b)(1), unless the authorization is terminated or revoked sooner.  Performed at [facility], [address] [place], [place] [address]          Radiology Studies last 96 hours: DG Chest 2 View  Result Date: 09/12/2021 CLINICAL DATA:  Shortness of breath. EXAM: CHEST - 2 VIEW COMPARISON:  None Available. FINDINGS: The cardiomediastinal silhouette is unremarkable. There is no evidence of focal airspace disease, pulmonary edema, suspicious pulmonary nodule/mass, pleural effusion, or pneumothorax. No acute bony abnormalities are identified. IMPRESSION: No active cardiopulmonary disease. Electronically Signed   By: Harmon Pier M.D.   On: 09/12/2021 16:35            LOS: 0 days      Sunnie Nielsen, DO [facility] 09/13/2021, 6:37 PM   Staff may message me via secure chat in Epic  but this may not receive immediate response,  please page for urgent matters!  If 7PM-7AM, please contact night-coverage [email or web address]  Dictation software was used to generate the above note. Typos may occur and escape review, as with typed/written notes. Please contact Dr Lyn Hollingshead directly for clarity if needed.

## 2021-09-13 NOTE — ED Notes (Signed)
Pt taken to the bathroom at this time

## 2021-09-13 NOTE — ED Notes (Addendum)
Pt assisted to the bathroom with standby assistance

## 2021-09-13 NOTE — Progress Notes (Signed)
Patient ID: Annette Phillips, female   DOB: 14-Feb-1966, 56 y.o.   MRN: [id number] Patient has been tachycardic in 110s-120s. MD aware. Ambulated before last set of vital signs (blood completion vitals) Heart rate was elevated to 124. Patient in [place]. Night shift RN and charge nurse notified. MD aware.  Lidia Collum, RN

## 2021-09-13 NOTE — Consult Note (Signed)
Annette Minium, MD [facility]  [address], [address] [place], [place] [address] Phone: [phone] Fax : [phone]  Consultation  Referring Provider:     Dr. Sedalia Muta Primary Care Physician:  Erasmo Downer, MD Primary Gastroenterologist:  Dr. Allegra Lai         Reason for Consultation:     Symptomatic anemia  Date of Admission:  09/12/2021 Date of Consultation:  09/13/2021         HPI:   Annette Phillips is a 56 y.o. female who was admitted to the hospital with dizziness and shortness of breath with near syncope.  The patient had originally presented to urgent care and was sent to the emergency room.  The concern was that the patient's hemoglobin was 5.3 on admission.  The patient's hemoglobin 2 years ago was 11.5 and the patient was transfused and this morning the hemoglobin is 7.7. In September 2021 the patient had an upper endoscopy by Dr. Allegra Lai that showed a hiatal hernia but did not show any other significant pathology.  It appears that the patient had a colonoscopy in 2017 at [facility].  At that time the patient was found to have perianal skin tags with internal hemorrhoids and the colonoscopy was otherwise normal. The patient denies any NSAID use on a regular basis.  She did take ibuprofen 400 mg for right shoulder pain 1 week ago. The patient denies any black stools bloody stools or any other sign of blood loss including hematuria.  She also denies any unexplained weight loss fevers chills nausea or vomiting.   Past Medical History:  Diagnosis Date   Goiter    Hypothyroid    Prediabetes    Recurrent cold sores     Past Surgical History:  Procedure Laterality Date   APPENDECTOMY     BREAST BIOPSY Right 2015   benign   CHOLECYSTECTOMY N/A 02/28/2019   Procedure: LAPAROSCOPIC CHOLECYSTECTOMY;  Surgeon: Henrene Dodge, MD;  Location: [facility] ORS;  Service: General;  Laterality: N/A;   ESOPHAGOGASTRODUODENOSCOPY (EGD) WITH PROPOFOL N/A 02/27/2019   Procedure: ESOPHAGOGASTRODUODENOSCOPY (EGD)  WITH PROPOFOL;  Surgeon: Toney Reil, MD;  Location: [facility] ENDOSCOPY;  Service: Gastroenterology;  Laterality: N/A;   KNEE ARTHROSCOPY Left 2008   torn meniscus    Prior to Admission medications   Medication Sig Start Date End Date Taking? Authorizing Provider  levothyroxine (SYNTHROID) 100 MCG tablet Take 1 tablet (100 mcg total) by mouth daily before breakfast. Patient taking differently: Take 100 mcg by mouth at bedtime. 07/26/21  Yes Bacigalupo, Marzella Schlein, MD  Multiple Vitamin (MULTI-VITAMINS) TABS Take 1 tablet by mouth in the morning.   Yes [provider]    Family History  Problem Relation Age of Onset   Thyroid disease Mother    Diabetes Mellitus II Mother    Thyroid disease Father    Healthy Sister    Hypothyroidism Daughter    Hypothyroidism Son    Diabetes Mellitus I Son    Kidney cancer Maternal Grandmother    Lung cancer Maternal Grandfather        smoker   Colon cancer Neg Hx    Breast cancer Neg Hx      Social History   Tobacco Use   Smoking status: Never   Smokeless tobacco: Never  Vaping Use   Vaping Use: Never used  Substance Use Topics   Alcohol use: Yes    Alcohol/week: 0.0 standard drinks of alcohol    Comment: rare   Drug use: Never  Allergies as of 09/12/2021 - Review Complete 09/12/2021  Allergen Reaction Noted   Aspirin Other (See Comments) 04/30/2014   Penicillin g Other (See Comments) 04/30/2014    Review of Systems:    All systems reviewed and negative except where noted in HPI.   Physical Exam:  Vital signs in last 24 hours: Temp:  [98.2 F (36.8 C)-99.1 F (37.3 C)] 98.3 F (36.8 C) (07/31 0901) Pulse Rate:  [97-140] 100 (07/31 0901) Resp:  [10-20] 18 (07/31 0901) BP: (112-158)/(72-96) 125/85 (07/31 0901) SpO2:  [96 %-100 %] 99 % (07/31 0901) Weight:  [74.8 kg] 74.8 kg (07/30 1541)   General:   Pleasant, cooperative in NAD Head:  Normocephalic and atraumatic. Eyes:   No icterus.   Conjunctiva pink.  PERRLA. Ears:  Normal auditory acuity. Neck:  Supple; no masses or thyroidomegaly Lungs: Respirations even and unlabored. Lungs clear to auscultation bilaterally.   No wheezes, crackles, or rhonchi.  Heart:  Regular rate and rhythm;  Without murmur, clicks, rubs or gallops Abdomen:  Soft, nondistended, nontender. Normal bowel sounds. No appreciable masses or hepatomegaly.  No rebound or guarding.  Rectal:  Not performed. Msk:  Symmetrical without gross deformities.    Extremities:  Without edema, cyanosis or clubbing. Neurologic:  Alert and oriented x3;  grossly normal neurologically. Skin:  Intact without significant lesions or rashes. Cervical Nodes:  No significant cervical adenopathy. Psych:  Alert and cooperative. Normal affect.  LAB RESULTS: Recent Labs    09/12/21 1544 09/12/21 1726 09/12/21 2108 09/13/21 0153  WBC 5.9 6.5  --  6.1  HGB 5.3* 5.4* 6.9* 7.7*  HCT 18.4* 18.5* 22.6* 24.8*  PLT 282 300  --  249   BMET Recent Labs    09/12/21 1544 09/13/21 0521  NA 134* 136  K 3.8 4.0  CL 105 108  CO2 22 22  GLUCOSE 125* 96  BUN 14 9  CREATININE 0.64 0.70  CALCIUM 8.8* 8.4*   LFT Recent Labs    09/12/21 1544  PROT 6.7  ALBUMIN 3.6  AST 22  ALT 18  ALKPHOS 41  BILITOT 0.4   PT/INR Recent Labs    09/12/21 1724  LABPROT 13.2  INR 1.0    STUDIES: DG Chest 2 View  Result Date: 09/12/2021 CLINICAL DATA:  Shortness of breath. EXAM: CHEST - 2 VIEW COMPARISON:  Annette Phillips Available. FINDINGS: The cardiomediastinal silhouette is unremarkable. There is no evidence of focal airspace disease, pulmonary edema, suspicious pulmonary nodule/mass, pleural effusion, or pneumothorax. No acute bony abnormalities are identified. IMPRESSION: No active cardiopulmonary disease. Electronically Signed   By: Harmon Pier M.D.   On: 09/12/2021 16:35      Impression / Plan:   Assessment: Principal Problem:   Symptomatic anemia Active Problems:   Hypothyroid   Esophagitis    Hemorrhoids   Sinus tachycardia   Elevated lactic acid level   Annette Phillips is a 56 y.o. y/o female with profound anemia and the patient is symptomatic.  The patient has received 3 units of packed red blood cells today.  The patient states that she had a EGD and colonoscopy prior to having her gallbladder out to rule out any other possible causes of her abdominal pain at that time back in 2021.  The patient also reports that she had a previous colonoscopy prior to that colonoscopy in 2021 at [facility].  Plan: This patient continues to be anemic and have symptomatic profound anemia.  The patient is being transfused at the present time and  will be set up for an EGD and colonoscopy for tomorrow.  The patient has been told that if the EGD and colonoscopy are negative that the patient may need to undergo a capsule endoscopy.  The patient has been explained the plan and will be given a prep tonight for her procedure tomorrow.  The patient has been explained the plan and agrees with it.  Thank you for involving me in the care of this patient.      LOS: 0 days   Annette Minium, MD, [facility] 09/13/2021, 1:01 PM,  Pager [phone] 7am-5pm  Check AMION for 5pm -7am coverage and on weekends   Note: This dictation was prepared with Dragon dictation along with smaller phrase technology. Any transcriptional errors that result from this process are unintentional.

## 2021-09-14 ENCOUNTER — Encounter
Admission: EM | Disposition: A | Discharge status: Home / Self Care | Payer: Self-pay | Source: Home / Self Care | Attending: Osteopathic Medicine

## 2021-09-14 ENCOUNTER — Observation Stay: Payer: BC Managed Care – PPO | Admitting: General Practice

## 2021-09-14 DIAGNOSIS — D649 Anemia, unspecified: Secondary | ICD-10-CM | POA: Diagnosis not present

## 2021-09-14 HISTORY — PX: COLONOSCOPY WITH PROPOFOL: SHX5780

## 2021-09-14 HISTORY — PX: ESOPHAGOGASTRODUODENOSCOPY (EGD) WITH PROPOFOL: SHX5813

## 2021-09-14 LAB — BPAM RBC
Blood Product Expiration Date: 202308192359
Blood Product Expiration Date: 202308192359
Blood Product Expiration Date: 202308312359
ISSUE DATE / TIME: 202307301802
ISSUE DATE / TIME: 202307302245
ISSUE DATE / TIME: 202307311606
Unit Type and Rh: 6200
Unit Type and Rh: 6200
Unit Type and Rh: 6200

## 2021-09-14 LAB — TYPE AND SCREEN
ABO/RH(D): A POS
Antibody Screen: NEGATIVE
Unit division: 0
Unit division: 0
Unit division: 0

## 2021-09-14 LAB — HEMOGLOBIN AND HEMATOCRIT, BLOOD
HCT: 32.6 % — ABNORMAL LOW (ref 36.0–46.0)
Hemoglobin: 10.4 g/dL — ABNORMAL LOW (ref 12.0–15.0)

## 2021-09-14 LAB — PROCALCITONIN: Procalcitonin: <0.1 ng/mL

## 2021-09-14 SURGERY — COLONOSCOPY WITH PROPOFOL
Anesthesia: General

## 2021-09-14 MED ORDER — PROPOFOL 10 MG/ML IV BOLUS
INTRAVENOUS | Status: DC | PRN
  Administered 2021-09-14: 100 mg via INTRAVENOUS
  Administered 2021-09-14 (×6): 40 mg via INTRAVENOUS

## 2021-09-14 MED ORDER — LIDOCAINE HCL (CARDIAC) PF 100 MG/5ML IV SOSY
PREFILLED_SYRINGE | INTRAVENOUS | Status: DC | PRN
  Administered 2021-09-14: 100 mg via INTRAVENOUS

## 2021-09-14 NOTE — Op Note (Signed)
[facility] Patient Name: Annette Phillips Procedure Date: 09/14/2021 11:12 AM MRN: [id number] Account #: [id number] Date of Birth: October 12, 1965 Admit Type: Outpatient Age: 56 Room: [facility] ENDO ROOM 4 Gender: Female Note Status: Finalized Instrument Name: Prentice Docker [id number] Procedure:             Colonoscopy Indications:           Iron deficiency anemia Providers:             Midge Minium MD, MD Medicines:             Propofol per Anesthesia Complications:         No immediate complications. Procedure:             Pre-Anesthesia Assessment:                        - Prior to the procedure, a History and Physical was                         performed, and patient medications and allergies were                         reviewed. The patient's tolerance of previous                         anesthesia was also reviewed. The risks and benefits                         of the procedure and the sedation options and risks                         were discussed with the patient. All questions were                         answered, and informed consent was obtained. Prior                         Anticoagulants: The patient has taken no previous                         anticoagulant or antiplatelet agents. ASA Grade                         Assessment: II - A patient with mild systemic disease.                         After reviewing the risks and benefits, the patient                         was deemed in satisfactory condition to undergo the                         procedure.                        After obtaining informed consent, the colonoscope was                         passed under direct vision. Throughout the procedure,  the patient's blood pressure, pulse, and oxygen                         saturations were monitored continuously. The                         Colonoscope was introduced through the anus and                          advanced to the the terminal ileum. The colonoscopy                         was performed without difficulty. The patient                         tolerated the procedure well. The quality of the bowel                         preparation was excellent. Findings:      The perianal and digital rectal examinations were normal.      Bleeding hemorrhoids were found during retroflexion. The hemorrhoids       were Grade III (internal hemorrhoids that prolapse but require manual       reduction).      The terminal ileum appeared normal. Impression:            - Bleeding hemorrhoids.                        - The examined portion of the ileum was normal.                        - No specimens collected. Recommendation:        - Return patient to hospital ward for ongoing care.                        - Clear liquid diet.                        - Continue present medications.                        - To visualize the small bowel, perform video capsule                         endoscopy. Procedure Code(s):     --- Professional ---                        [id number], Colonoscopy, flexible; diagnostic, including                         collection of specimen(s) by brushing or washing, when                         performed (separate procedure) Diagnosis Code(s):     --- Professional ---                        D50.9, Iron deficiency anemia, unspecified CPT copyright 2019 American Medical Association. All rights reserved. The codes documented in this report  are preliminary and upon coder review may  be revised to meet current compliance requirements. Midge Minium MD, MD 09/14/2021 11:42:44 AM This report has been signed electronically. Number of Addenda: 0 Note Initiated On: 09/14/2021 11:12 AM Scope Withdrawal Time: 0 hours 6 minutes 4 seconds  Total Procedure Duration: 0 hours 11 minutes 35 seconds  Estimated Blood Loss:  Estimated blood loss: none.      [facility]

## 2021-09-14 NOTE — Anesthesia Preprocedure Evaluation (Signed)
Anesthesia Evaluation  Patient identified by MRN, date of birth, ID band Patient awake    Reviewed: Allergy & Precautions, NPO status , Patient's Chart, lab work & pertinent test results  History of Anesthesia Complications Negative for: history of anesthetic complications  Airway Mallampati: II  TM Distance: >3 FB Neck ROM: full    Dental  (+) Teeth Intact   Pulmonary neg pulmonary ROS, neg shortness of breath, neg recent URI,    Pulmonary exam normal        Cardiovascular (-) CAD, (-) Past MI and (-) CABG negative cardio ROS Normal cardiovascular exam     Neuro/Psych negative neurological ROS  negative psych ROS   GI/Hepatic negative GI ROS, Neg liver ROS,   Endo/Other  Hypothyroidism   Renal/GU negative Renal ROS  negative genitourinary   Musculoskeletal   Abdominal   Peds  Hematology  (+) Blood dyscrasia, anemia ,   Anesthesia Other Findings Past Medical History: No date: Goiter No date: Hypothyroid No date: Prediabetes 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  Past Surgical History: No date: APPENDECTOMY 2015: BREAST BIOPSY; Right     Comment:  benign 02/28/2019: CHOLECYSTECTOMY; N/A     Comment:  Procedure: LAPAROSCOPIC CHOLECYSTECTOMY;  Surgeon:               Henrene Dodge, MD;  Location: [facility] ORS;  Service:               General;  Laterality: N/A; 02/27/2019: ESOPHAGOGASTRODUODENOSCOPY (EGD) WITH PROPOFOL; N/A     Comment:  Procedure: ESOPHAGOGASTRODUODENOSCOPY (EGD) WITH               PROPOFOL;  Surgeon: Toney Reil, MD;  Location:               [facility] ENDOSCOPY;  Service: Gastroenterology;  Laterality:               N/A; 2008: KNEE ARTHROSCOPY; Left     Comment:  torn meniscus  BMI    Body Mass Index: 25.84 kg/m      Reproductive/Obstetrics negative OB ROS                             Anesthesia Physical Anesthesia Plan  ASA: 2  Anesthesia Plan: General   Post-op  Pain Management: Minimal or no pain anticipated   Induction: Intravenous  PONV Risk Score and Plan: 3 and Propofol infusion, TIVA and Ondansetron  Airway Management Planned: Natural Airway and Nasal Cannula  Additional Equipment: None  Intra-op Plan:   Post-operative Plan:   Informed Consent: I have reviewed the patients History and Physical, chart, labs and discussed the procedure including the risks, benefits and alternatives for the proposed anesthesia with the patient or authorized representative who has indicated his/her understanding and acceptance.     Dental Advisory Given  Plan Discussed with: Anesthesiologist, CRNA and Surgeon  Anesthesia Plan Comments: (Patient consented for risks of anesthesia including but not limited to:  - adverse reactions to medications - risk of airway placement if required - damage to eyes, teeth, lips or other oral mucosa - nerve damage due to positioning  - sore throat or hoarseness - Damage to heart, brain, nerves, lungs, other parts of body or loss of life  Patient voiced understanding.)        Anesthesia Quick Evaluation

## 2021-09-14 NOTE — TOC CM/SW Note (Signed)
  Transition of Care [facility]) Screening Note   Patient Details  Name: Annette Phillips Date of Birth: [birth year]   Transition of Care [facility]) CM/SW Contact:    Margarito Liner, LCSW Phone Number: 09/14/2021, 12:22 PM    Transition of Care Department [facility]) has reviewed patient and no TOC needs have been identified at this time. We will continue to monitor patient advancement through interdisciplinary progression rounds. If new patient transition needs arise, please place a TOC consult.

## 2021-09-14 NOTE — Anesthesia Postprocedure Evaluation (Signed)
Anesthesia Post Note  Patient: Annette Phillips  Procedure(s) Performed: COLONOSCOPY WITH PROPOFOL ESOPHAGOGASTRODUODENOSCOPY (EGD) WITH PROPOFOL  Patient location during evaluation: Endoscopy Anesthesia Type: General Level of consciousness: awake and alert Pain management: pain level controlled Vital Signs Assessment: post-procedure vital signs reviewed and stable Respiratory status: spontaneous breathing, nonlabored ventilation, respiratory function stable and patient connected to nasal cannula oxygen Cardiovascular status: blood pressure returned to baseline and stable Postop Assessment: no apparent nausea or vomiting Anesthetic complications: no   No notable events documented.   Last Vitals:  Vitals:   09/14/21 1204 09/14/21 1214  BP: 122/77 (!) 124/92  Pulse: 84 88  Resp: 15 18  Temp:  (!) 35.9 C  SpO2: 97% 100%    Last Pain:  Vitals:   09/14/21 1214  TempSrc: Temporal  PainSc: 0-No pain                 Stephanie Coup

## 2021-09-14 NOTE — Op Note (Signed)
[facility]astroenterology Patient Name: Annette Phillips Procedure Date: 09/14/2021 11:12 AM MRN: [id number] Account #: [id number] Date of Birth: 1965-05-18 Admit Type: Outpatient Age: 56 Room: [facility] ENDO ROOM 4 Gender: Female Note Status: Finalized Instrument Name: Upper Endoscope [id number] Procedure:             Upper GI endoscopy Indications:           Iron deficiency anemia Providers:             Midge Minium MD, MD Medicines:             Propofol per Anesthesia Complications:         No immediate complications. Procedure:             Pre-Anesthesia Assessment:                        - Prior to the procedure, a History and Physical was                         performed, and patient medications and allergies were                         reviewed. The patient's tolerance of previous                         anesthesia was also reviewed. The risks and benefits                         of the procedure and the sedation options and risks                         were discussed with the patient. All questions were                         answered, and informed consent was obtained. Prior                         Anticoagulants: The patient has taken no previous                         anticoagulant or antiplatelet agents. ASA Grade                         Assessment: II - A patient with mild systemic disease.                         After reviewing the risks and benefits, the patient                         was deemed in satisfactory condition to undergo the                         procedure.                        After obtaining informed consent, the endoscope was                         passed under direct vision. Throughout the procedure,  the patient's blood pressure, pulse, and oxygen                         saturations were monitored continuously. The Endoscope                         was introduced through the mouth, and advanced to the                          second part of duodenum. The upper GI endoscopy was                         accomplished without difficulty. The patient tolerated                         the procedure well. Findings:      A small hiatal hernia was present.      A non-obstructing Schatzki ring was found at the gastroesophageal       junction.      The stomach was normal.      The examined duodenum was normal. Impression:            - Small hiatal hernia.                        - Non-obstructing Schatzki ring.                        - Normal stomach.                        - Normal examined duodenum.                        - No specimens collected. Recommendation:        - Perform a colonoscopy.                        - Resume previous diet.                        - Continue present medications. Procedure Code(s):     --- Professional ---                        [phone], Esophagogastroduodenoscopy, flexible,                         transoral; diagnostic, including collection of                         specimen(s) by brushing or washing, when performed                         (separate procedure) Diagnosis Code(s):     --- Professional ---                        D50.9, Iron deficiency anemia, unspecified CPT copyright 2019 American Medical Association. All rights reserved. The codes documented in this report are preliminary and upon coder review may  be revised to meet current compliance requirements. Midge Minium MD, MD 09/14/2021 11:27:23 AM This report has  been signed electronically. Number of Addenda: 0 Note Initiated On: 09/14/2021 11:12 AM Estimated Blood Loss:  Estimated blood loss: none.      [facility]

## 2021-09-14 NOTE — Plan of Care (Signed)
Patient ID: Annette Phillips, female   DOB: Apr 25, 1965, 56 y.o.   MRN: [id number]  Problem: Education: Goal: Knowledge of General Education information will improve Description: Including pain rating scale, medication(s)/side effects and non-pharmacologic comfort measures Outcome: Adequate for Discharge   Problem: Health Behavior/Discharge Planning: Goal: Ability to manage health-related needs will improve Outcome: Adequate for Discharge   Problem: Clinical Measurements: Goal: Ability to maintain clinical measurements within normal limits will improve Outcome: Adequate for Discharge Goal: Will remain free from infection Outcome: Adequate for Discharge Goal: Diagnostic test results will improve Outcome: Adequate for Discharge Goal: Respiratory complications will improve Outcome: Adequate for Discharge Goal: Cardiovascular complication will be avoided Outcome: Adequate for Discharge   Problem: Activity: Goal: Risk for activity intolerance will decrease Outcome: Adequate for Discharge   Problem: Nutrition: Goal: Adequate nutrition will be maintained Outcome: Adequate for Discharge   Problem: Coping: Goal: Level of anxiety will decrease Outcome: Adequate for Discharge   Problem: Elimination: Goal: Will not experience complications related to bowel motility Outcome: Adequate for Discharge Goal: Will not experience complications related to urinary retention Outcome: Adequate for Discharge   Problem: Pain Managment: Goal: General experience of comfort will improve Outcome: Adequate for Discharge   Problem: Safety: Goal: Ability to remain free from injury will improve Outcome: Adequate for Discharge   Problem: Skin Integrity: Goal: Risk for impaired skin integrity will decrease Outcome: Adequate for Discharge   Lidia Collum, RN

## 2021-09-14 NOTE — Transfer of Care (Signed)
Immediate Anesthesia Transfer of Care Note  Patient: Annette Phillips  Procedure(s) Performed: COLONOSCOPY WITH PROPOFOL ESOPHAGOGASTRODUODENOSCOPY (EGD) WITH PROPOFOL  Patient Location: Endoscopy Unit  Anesthesia Type:General  Level of Consciousness: drowsy  Airway & Oxygen Therapy: Patient Spontanous Breathing and Patient connected to nasal cannula oxygen  Post-op Assessment: Report given to RN, Post -op Vital signs reviewed and stable and Patient moving all extremities  Post vital signs: Reviewed and stable  Last Vitals:  Vitals Value Taken Time  BP 100/64 09/14/21 1144  Temp    Pulse 86 09/14/21 1144  Resp 15 09/14/21 1144  SpO2 92 % 09/14/21 1144    Last Pain:  Vitals:   09/14/21 1144  TempSrc:   PainSc: Asleep         Complications: No notable events documented.

## 2021-09-14 NOTE — Discharge Summary (Signed)
Physician Discharge Summary   Patient: Annette Phillips MRN: [id number]  DOB: 03-05-1965   Admit:     Date of Admission: 09/12/2021 Admitted from: homr   Discharge: Date of discharge: 09/14/21 Disposition: Home Condition at discharge: good  CODE STATUS: FULL   Diet recommendation: CLD pending procedure tomorrow    Discharge Physician: Emeterio Reeve, DO [facility]     PCP: Virginia Crews, MD  Recommendations for Outpatient Follow-up:  Follow up with PCP Virginia Crews, MD in 2-3 weeks Please obtain labs/tests: CBC in 2-3 weeks Follow-up outpatient, as noted below.,  For capsule endoscopy Please follow up on the following pending results: Capsule endoscopy planned for tomorrow 09/15/2021 Follow-up as directed with gastroenterology   Discharge Instructions     Diet - low sodium heart healthy   Complete by: As directed    Discharge instructions   Complete by: As directed    7:30 in the morning tomorrow 09/15/2021. Stop by the registration desk in the medical mall to register. They will send you to endoscopy. You do not need someone to drive you home as this does not require sedation. You can call Endoscopy @ [phone] for any questions before 5pm.   Increase activity slowly   Complete by: As directed           Brief/Interim Summary: Annette Phillips is a 56 year old female with history of hypothyroid, who presents emergency department from urgent care on 09/12/2021 for chief concerns of dizziness and shortness of breath and near syncope, right ear pressure over the last 2 days. 07/30: tachycardic 140, hemoglobin 5.3, platelets 282. Lactic acid was 1.5 --> 2.3. ED treatment: LR 1 L bolus, 2 units of PRBC.  Admitted to hospitalist service for symptomatic anemia, iron deficiency, received iron sucrose 100 mg IV, checking H. pylori antigen, GI consultation placed (Dr. Marius Ditch).  Of note, hemoglobin 2 years ago (02/2019) was 10.5-11.5, iron and TIBC as  well as ferritin were in normal range at that time. 07/31: Remains mildly tachycardic but improved, lactic acid down to 1.2, hemoglobin 7.7 status post 2 units PRBC, given tachycardia another unit PRBC ordered for symptomatic anemia. H. pylori labs in process. GI planning for endoscopy tomorrow (EGD plus colonoscopy), in the evening RN notified night coverage of multiple bloody stools per patient (RN did not see) during bowel prep, stools now normal. 08/01: Hemoglobin 10.4 posttransfusion third unit PRBC.  EGD: Small hiatal hernia, nonobstructing Schatzki ring, stomach and duodenum normal. Colonoscopy: Bleeding hemorrhoids grade 3. GI recs: CLD, capsule endoscopy which will be placed tomorrow, patient okay to discharge for now on clear liquid diet pending capsule endoscopy   Consultants:  Gastroenterology  Procedures:  EGD Colonoscopy      Discharge Diagnoses: Principal Problem:   Symptomatic anemia Active Problems:   Hypothyroid   Esophagitis   Hemorrhoids   Sinus tachycardia   Elevated lactic acid level    Assessment & Plan:  Symptomatic anemia - Iron deficiency anemia likely chronic however sudden onset symptoms concern for more acute drop  - Iron sucrose 100 mg IV, one-time dose ordered - s/p 3 units of PRBC to reach goal hemoglobin is more than 8 - Check H. pylori antigen and IgM, IgG - EGD and colonoscopy, only significant findings for bleeding were hemorrhoids. - Clear liquids at this time --Patient is scheduled for capsule endoscopy for tomorrow  Hypothyroid - Synthroid 100 mcg   Sinus tachycardia - Secondary to anemia -improved with correction of  anemia - Metoprolol tartrate 2.5 mg IV every 2 hours.  For heart rate greater than 120, 4 doses ordered  Elevated lactic acid level - Presumed secondary to anemia - Recheck lactic acid scheduled      Discharge Instructions  Allergies as of 09/14/2021       Reactions   Aspirin Other (See Comments)    Penicillin G Other (See Comments)        Medication List     TAKE these medications    levothyroxine 100 MCG tablet Commonly known as: SYNTHROID Take 1 tablet (100 mcg total) by mouth daily before breakfast. What changed: when to take this   Multi-Vitamins Tabs Take 1 tablet by mouth in the morning.          Allergies  Allergen Reactions   Aspirin Other (See Comments)   Penicillin G Other (See Comments)     Subjective: Patient feels good this afternoon after her procedures, no pain, no bleeding.  No dizziness/lightheadedness, no chest pain/shortness of breath, asking about potential discharge today   Discharge Exam: Vitals:   09/14/21 1204 09/14/21 1214  BP: 122/77 (!) 124/92  Pulse: 84 88  Resp: 15 18  Temp:  (!) 96.7 F (35.9 C)  SpO2: 97% 100%  General: Pt is alert, awake, not in acute distress Cardiovascular: RRR, S1/S2 +, no rubs, no gallops Respiratory: CTA bilaterally, no wheezing, no rhonchi Abdominal: Soft, NT, ND, bowel sounds + Extremities: no edema, no cyanosis     The results of significant diagnostics from this hospitalization (including imaging, microbiology, ancillary and laboratory) are listed below for reference.     Microbiology: Recent Results (from the past 240 hour(s))  SARS Coronavirus 2 by RT PCR (hospital order, performed in [facility] hospital lab) *cepheid single result test* Anterior Nasal Swab     Status: None   Collection Time: 09/12/21  3:44 PM   Specimen: Anterior Nasal Swab  Result Value Ref Range Status   SARS Coronavirus 2 by RT PCR NEGATIVE NEGATIVE Final    Comment: (NOTE) SARS-CoV-2 target nucleic acids are NOT DETECTED.  The SARS-CoV-2 RNA is generally detectable in upper and lower respiratory specimens during the acute phase of infection. The lowest concentration of SARS-CoV-2 viral copies this assay can detect is 250 copies / mL. A negative result does not preclude SARS-CoV-2 infection and should not be  used as the sole basis for treatment or other patient management decisions.  A negative result may occur with improper specimen collection / handling, submission of specimen other than nasopharyngeal swab, presence of viral mutation(s) within the areas targeted by this assay, and inadequate number of viral copies (<250 copies / mL). A negative result must be combined with clinical observations, patient history, and epidemiological information.  Fact Sheet for Patients:   RoadLapTop.co.za  Fact Sheet for Healthcare Providers: http://kim-miller.com/  This test is not yet approved or  cleared by the [place] FDA and has been authorized for detection and/or diagnosis of SARS-CoV-2 by FDA under an Emergency Use Authorization (EUA).  This EUA will remain in effect (meaning this test can be used) for the duration of the COVID-19 declaration under Section 564(b)(1) of the Act, 21 U.S.C. section 360bbb-3(b)(1), unless the authorization is terminated or revoked sooner.  Performed at [facility], [address] [place], [place] [address]      Labs: BNP (last 3 results) No results for input(s): "BNP" in the last 8760 hours. Basic Metabolic Panel: Recent Labs  Lab 09/12/21  1544 09/13/21 0521  NA 134* 136  K 3.8 4.0  CL 105 108  CO2 22 22  GLUCOSE 125* 96  BUN 14 9  CREATININE 0.64 0.70  CALCIUM 8.8* 8.4*  MG 2.3  --    Liver Function Tests: Recent Labs  Lab 09/12/21 1544  AST 22  ALT 18  ALKPHOS 41  BILITOT 0.4  PROT 6.7  ALBUMIN 3.6   No results for input(s): "LIPASE", "AMYLASE" in the last 168 hours. No results for input(s): "AMMONIA" in the last 168 hours. CBC: Recent Labs  Lab 09/12/21 1544 09/12/21 1726 09/12/21 2108 09/13/21 0153 09/13/21 2348  WBC 5.9 6.5  --  6.1  --   NEUTROABS 4.2 4.7  --   --   --   HGB 5.3* 5.4* 6.9* 7.7* 10.4*  HCT 18.4* 18.5* 22.6* 24.8* 32.6*  MCV 88.9 88.9  --  87.3   --   PLT 282 300  --  249  --    Cardiac Enzymes: No results for input(s): "CKTOTAL", "CKMB", "CKMBINDEX", "TROPONINI" in the last 168 hours. BNP: Invalid input(s): "POCBNP" CBG: Recent Labs  Lab 09/12/21 1526 09/13/21 0902  GLUCAP 121* 84   D-Dimer No results for input(s): "DDIMER" in the last 72 hours. Hgb A1c No results for input(s): "HGBA1C" in the last 72 hours. Lipid Profile No results for input(s): "CHOL", "HDL", "LDLCALC", "TRIG", "CHOLHDL", "LDLDIRECT" in the last 72 hours. Thyroid function studies Recent Labs    09/12/21 1544  TSH 0.146*   Anemia work up Recent Labs    09/12/21 1544  TIBC 454*  IRON 23*  RETICCTPCT 4.7*   Urinalysis    Component Value Date/Time   COLORURINE YELLOW (A) 09/12/2021 1544   APPEARANCEUR HAZY (A) 09/12/2021 1544   LABSPEC 1.008 09/12/2021 1544   PHURINE 7.0 09/12/2021 [place] 09/12/2021 1544   HGBUR NEGATIVE 09/12/2021 1544   [place] 09/12/2021 1544   [place] 09/12/2021 1544   PROTEINUR NEGATIVE 09/12/2021 1544   NITRITE NEGATIVE 09/12/2021 1544   LEUKOCYTESUR TRACE (A) 09/12/2021 1544   Sepsis Labs Recent Labs  Lab 09/12/21 1544 09/12/21 1726 09/13/21 0153  WBC 5.9 6.5 6.1   Microbiology Recent Results (from the past 240 hour(s))  SARS Coronavirus 2 by RT PCR (hospital order, performed in [place] hospital lab) *cepheid single result test* Anterior Nasal Swab     Status: None   Collection Time: 09/12/21  3:44 PM   Specimen: Anterior Nasal Swab  Result Value Ref Range Status   SARS Coronavirus 2 by RT PCR NEGATIVE NEGATIVE Final    Comment: (NOTE) SARS-CoV-2 target nucleic acids are NOT DETECTED.  The SARS-CoV-2 RNA is generally detectable in upper and lower respiratory specimens during the acute phase of infection. The lowest concentration of SARS-CoV-2 viral copies this assay can detect is 250 copies / mL. A negative result does not preclude SARS-CoV-2  infection and should not be used as the sole basis for treatment or other patient management decisions.  A negative result may occur with improper specimen collection / handling, submission of specimen other than nasopharyngeal swab, presence of viral mutation(s) within the areas targeted by this assay, and inadequate number of viral copies (<250 copies / mL). A negative result must be combined with clinical observations, patient history, and epidemiological information.  Fact Sheet for Patients:   https://www.patel.info/  Fact Sheet for Healthcare Providers: https://hall.com/  This test is not yet approved or  cleared by the [place]  FDA and has been authorized for detection and/or diagnosis of SARS-CoV-2 by FDA under an Emergency Use Authorization (EUA).  This EUA will remain in effect (meaning this test can be used) for the duration of the COVID-19 declaration under Section 564(b)(1) of the Act, 21 U.S.C. section 360bbb-3(b)(1), unless the authorization is terminated or revoked sooner.  Performed at [facility], [address], [place], [place] [address]    Imaging DG Chest 2 View  Result Date: 09/12/2021 CLINICAL DATA:  Shortness of breath. EXAM: CHEST - 2 VIEW COMPARISON:  None Available. FINDINGS: The cardiomediastinal silhouette is unremarkable. There is no evidence of focal airspace disease, pulmonary edema, suspicious pulmonary nodule/mass, pleural effusion, or pneumothorax. No acute bony abnormalities are identified. IMPRESSION: No active cardiopulmonary disease. Electronically Signed   By: Harmon Pier M.D.   On: 09/12/2021 16:35      Time coordinating discharge: Over 30 minutes  SIGNED:  Sunnie Nielsen DO [facility]

## 2021-09-14 NOTE — Progress Notes (Signed)
Patient ID: Annette Phillips, female   DOB: 04/07/1965, 56 y.o.   MRN: [id number]  An After Visit Summary was printed and given to the patient.   Patient education given on follow up appointment in the morning at [facility] - 7:30 am and clear liquid diet. The patient expresses understanding and acceptance of instructions.   Medications obtained from pharmacy and returned to patient.  Lidia Collum 09/14/2021 5:13 PM

## 2021-09-15 ENCOUNTER — Encounter
Admission: RE | Disposition: A | Discharge status: Home / Self Care | Payer: Self-pay | Source: Home / Self Care | Attending: Gastroenterology

## 2021-09-15 ENCOUNTER — Encounter: Payer: Self-pay | Admitting: Gastroenterology

## 2021-09-15 ENCOUNTER — Ambulatory Visit: Payer: BC Managed Care – PPO | Admitting: Physician Assistant

## 2021-09-15 ENCOUNTER — Ambulatory Visit
Admission: RE | Admit: 2021-09-15 | Discharge: 2021-09-15 | Disposition: A | Discharge status: Home / Self Care | Payer: BC Managed Care – PPO | Attending: Gastroenterology | Admitting: Gastroenterology

## 2021-09-15 DIAGNOSIS — D649 Anemia, unspecified: Secondary | ICD-10-CM | POA: Insufficient documentation

## 2021-09-15 HISTORY — PX: GIVENS CAPSULE STUDY: SHX5432

## 2021-09-15 SURGERY — IMAGING PROCEDURE, GI TRACT, INTRALUMINAL, VIA CAPSULE

## 2021-09-16 ENCOUNTER — Encounter: Payer: Self-pay | Admitting: Gastroenterology

## 2021-09-16 LAB — H PYLORI, IGM, IGG, IGA AB
H Pylori IgG: 0.12 Index Value (ref 0.00–0.79)
H. Pylogi, Iga Abs: <9 units (ref 0.0–8.9)
H. Pylogi, Igm Abs: <9 units (ref 0.0–8.9)

## 2021-09-17 ENCOUNTER — Other Ambulatory Visit: Payer: Self-pay | Admitting: Family Medicine

## 2021-09-17 NOTE — Progress Notes (Signed)
Negative blood test for H Pylori. Continue to track diet/symptoms of abdominal complaints.  Jacky Kindle, FNP  [facility] [address] [place], [place] [address] [phone] (phone) [phone] (fax) [facility]

## 2021-09-17 NOTE — OR Nursing (Signed)
Patient called to express concerns that she has not seen the passed capsule from her capsule endoscopy. States only one BM since the study. States no nausea or abdominal pain. Dr. Servando Snare notified. Patient instructed per Dr. Servando Snare that she may not see the passed capsule and should have no concerns regarding not seeing it. Instructed her that she could come for an x-ray to confirm the passing of the capsule if she still has concerns. Patient declined x-ray at present but will notify Dr. Servando Snare is she has any further concerns.

## 2021-09-22 ENCOUNTER — Telehealth: Payer: Self-pay | Admitting: Gastroenterology

## 2021-09-22 NOTE — Telephone Encounter (Signed)
Pt left message requesting results of capsule study procedure was done on 09/15/2021

## 2021-09-23 NOTE — Telephone Encounter (Signed)
Pt is aware that Dr Tobi Bastos is out of the office and I will call her back upon his return

## 2021-10-04 NOTE — Telephone Encounter (Signed)
Study was normal, report sent for scanning today

## 2021-10-05 NOTE — Telephone Encounter (Signed)
Left detailed msg on VM per HIPAA  

## 2021-10-06 ENCOUNTER — Other Ambulatory Visit: Payer: Self-pay

## 2021-10-14 ENCOUNTER — Encounter: Payer: Self-pay | Admitting: Gastroenterology

## 2021-11-25 ENCOUNTER — Ambulatory Visit: Payer: Self-pay | Admitting: *Deleted

## 2021-11-25 NOTE — Progress Notes (Signed)
I,Sulibeya S Dimas,acting as a Neurosurgeon for [employer], PA-C.,have documented all relevant documentation on the behalf of Alfredia Ferguson, PA-C,as directed by  Alfredia Ferguson, PA-C while in the presence of Alfredia Ferguson, PA-C.     Established patient visit   Patient: Annette Phillips   DOB: December 13, 1965   56 y.o. Female  MRN: [id number] Visit Date: 11/26/2021  Today's healthcare provider: Alfredia Ferguson, PA-C   Chief Complaint  Patient presents with   Abdominal Pain   Subjective    Abdominal Pain This is a new problem. The current episode started in the past 7 days. The problem occurs constantly. The problem has been unchanged. The pain is located in the RUQ. The pain is mild. The quality of the pain is burning. The abdominal pain does not radiate. Associated symptoms include melena. Pertinent negatives include no anorexia, belching, constipation, diarrhea, dysuria, fever, flatus, hematuria, myalgias, nausea or vomiting. The pain is aggravated by bowel movement. She has tried nothing for the symptoms. Prior diagnostic workup includes GI consult and upper endoscopy.   Pt endorses pain RUQ burning or sharp w/ BM. Will be brief or linger for a few minutes after BM. S/p cholecystectomy 2 years ago. Pt was recently significantly anemic, needed transfusion, s/p endoscopy, colonoscopy, capsule endoscopy as she was experiencing GI bleeding-- seeing blood in toilet and on toilet paper. Nothing was seen. Still experiencing bleeding was told 2/2 hemmrhoids. Denies any changes w/ bleeding or stool w/ new onset pain. Denies constipation or diarrhea. Denies acid reflux, nausea, vomiting .  Medications: Outpatient Medications Prior to Visit  Medication Sig   levothyroxine (SYNTHROID) 100 MCG tablet Take 1 tablet (100 mcg total) by mouth daily before breakfast. (Patient taking differently: Take 100 mcg by mouth at bedtime.)   Multiple Vitamin (MULTI-VITAMINS) TABS Take 1 tablet by mouth in the morning.    No facility-administered medications prior to visit.    Review of Systems  Constitutional:  Negative for appetite change, chills, fatigue and fever.  Respiratory:  Negative for chest tightness and shortness of breath.   Cardiovascular:  Negative for chest pain.  Gastrointestinal:  Positive for abdominal pain and melena. Negative for anorexia, constipation, diarrhea, flatus, nausea and vomiting.  Genitourinary:  Negative for dysuria and hematuria.  Musculoskeletal:  Negative for myalgias.      Objective    BP 122/81 (BP Location: Left Arm, Patient Position: Sitting, Cuff Size: Normal)   Pulse (!) 128   Temp 98 F (36.7 C) (Oral)   Resp 16   Wt 161 lb 3.2 oz (73.1 kg)   BMI 25.25 kg/m  BP Readings from Last 3 Encounters:  11/26/21 122/81  09/14/21 (!) 124/92  09/12/21 125/75   Wt Readings from Last 3 Encounters:  11/26/21 161 lb 3.2 oz (73.1 kg)  09/12/21 165 lb (74.8 kg)  07/22/21 169 lb 8 oz (76.9 kg)      Physical Exam Constitutional:      Appearance: She is not ill-appearing.  Abdominal:     General: Bowel sounds are normal.     Tenderness: There is abdominal tenderness in the right upper quadrant. There is no guarding. Negative signs include Murphy's sign, Rovsing's sign and McBurney's sign.  Neurological:     Mental Status: She is alert and oriented to person, place, and time.  Psychiatric:        Mood and Affect: Mood normal.        Behavior: Behavior normal.      No results found for  any visits on 11/26/21.  Assessment & Plan     RUQ abdominal pain Advised stat RUQ [place] r/o residual stones or sludge, gave ED precautions Ordered cbc, cmp, anemia panel,  lipase  2. Anemia Since gi w/u essentially normal will monitor cbc if still anemic, unsure if only hemorrhoids are to blame would consider heme referral   Return if symptoms worsen or fail to improve.      I, Mikey Kirschner, PA-C have reviewed all documentation for this visit. The documentation on   11/26/2021 for the exam, diagnosis, procedures, and orders are all accurate and complete.  Mikey Kirschner, PA-C [facility] [address] [place], [place], [address] Office: [phone] Fax: [place]

## 2021-11-25 NOTE — Telephone Encounter (Signed)
Can give her sooner appt if anyone has any openings

## 2021-11-25 NOTE — Telephone Encounter (Signed)
  Chief Complaint: incisional pain Symptoms: when BM Frequency: every BM 4 days Pertinent Negatives: Patient denies fever Disposition: [] ED /[] Urgent Care (no appt availability in office) / [x] Appointment(In office/virtual)/ []  [place] [facility]/ [] Home Care/ [] Refused Recommended Disposition /[] [place] [facility]/ []  Follow-up with PCP Additional Notes: Appt Monday, on wait list. Has had 3 abd surgeries and where the surgeries were hurts only when having a BM lasts a couple of min. Home care discussed.   Reason for Disposition  [1] MODERATE pain (e.g., interferes with normal activities) AND [2] pain comes and goes (cramps) AND [3] present > 24 hours  (Exception: Pain with Vomiting or Diarrhea - see that Guideline.)  Answer Assessment - Initial Assessment Questions 1. LOCATION: "Where does it hurt?"      Where prior surgeries were 2. RADIATION: "Does the pain shoot anywhere else?" (e.g., chest, back)     no 3. ONSET: "When did the pain begin?" (e.g., minutes, hours or days ago)      Last  few days with every BM 4. SUDDEN: "Gradual or sudden onset?"     suddenly 5. PATTERN "Does the pain come and go, or is it constant?"    - If it comes and goes: "How long does it last?" "Do you have pain now?"     (Note: Comes and goes means the pain is intermittent. It goes away completely between bouts.)    - If constant: "Is it getting better, staying the same, or getting worse?"      (Note: Constant means the pain never goes away completely; most serious pain is constant and gets worse.)      Everytime she has a BM for 4-5 days 6. SEVERITY: "How bad is the pain?"  (e.g., Scale 1-10; mild, moderate, or severe)    - MILD (1-3): Doesn't interfere with normal activities, abdomen soft and not tender to touch.     - MODERATE (4-7): Interferes with normal activities or awakens from sleep, abdomen tender to touch.     - SEVERE (8-10): Excruciating pain, doubled over, unable to do any normal  activities.       intense 7. RECURRENT SYMPTOM: "Have you ever had this type of stomach pain before?" If Yes, ask: "When was the last time?" and "What happened that time?"      With every BM 8. CAUSE: "What do you think is causing the stomach pain?"     Wondering if incisions on inside have something wrong 9. RELIEVING/AGGRAVATING FACTORS: "What makes it better or worse?" (e.g., antacids, bending or twisting motion, bowel movement)     Pushing in on the area 10. OTHER SYMPTOMS: "Do you have any other symptoms?" (e.g., back pain, diarrhea, fever, urination pain, vomiting)       no 11. PREGNANCY: "Is there any chance you are pregnant?" "When was your last menstrual period?"       no  Protocols used: Abdominal Pain - [facility]

## 2021-11-26 ENCOUNTER — Encounter: Payer: Self-pay | Admitting: Physician Assistant

## 2021-11-26 ENCOUNTER — Ambulatory Visit
Admission: RE | Admit: 2021-11-26 | Discharge: 2021-11-26 | Disposition: A | Discharge status: Home / Self Care | Payer: BC Managed Care – PPO | Source: Ambulatory Visit | Attending: Physician Assistant | Admitting: Physician Assistant

## 2021-11-26 ENCOUNTER — Ambulatory Visit: Payer: BC Managed Care – PPO | Admitting: Physician Assistant

## 2021-11-26 VITALS — BP 122/81 | HR 128 | Temp 98.0°F | Resp 16 | Wt 161.2 lb

## 2021-11-26 DIAGNOSIS — D649 Anemia, unspecified: Secondary | ICD-10-CM | POA: Diagnosis not present

## 2021-11-26 DIAGNOSIS — R1011 Right upper quadrant pain: Secondary | ICD-10-CM | POA: Diagnosis not present

## 2021-11-27 LAB — COMPREHENSIVE METABOLIC PANEL
ALT: 27 IU/L (ref 0–32)
AST: 23 IU/L (ref 0–40)
Albumin/Globulin Ratio: 1.5 (ref 1.2–2.2)
Albumin: 4.3 g/dL (ref 3.8–4.9)
Alkaline Phosphatase: 73 IU/L (ref 44–121)
BUN/Creatinine Ratio: 26 — ABNORMAL HIGH (ref 9–23)
BUN: 19 mg/dL (ref 6–24)
Bilirubin Total: 0.3 mg/dL (ref 0.0–1.2)
CO2: 22 mmol/L (ref 20–29)
Calcium: 9.3 mg/dL (ref 8.7–10.2)
Chloride: 103 mmol/L (ref 96–106)
Creatinine, Ser: 0.72 mg/dL (ref 0.57–1.00)
Globulin, Total: 2.8 g/dL (ref 1.5–4.5)
Glucose: 94 mg/dL (ref 70–99)
Potassium: 4.3 mmol/L (ref 3.5–5.2)
Sodium: 138 mmol/L (ref 134–144)
Total Protein: 7.1 g/dL (ref 6.0–8.5)
eGFR: 98 mL/min/{1.73_m2} (ref >59)

## 2021-11-27 LAB — CBC WITH DIFFERENTIAL/PLATELET
Basophils Absolute: 0 10*3/uL (ref 0.0–0.2)
Basos: 0 %
EOS (ABSOLUTE): 0.1 10*3/uL (ref 0.0–0.4)
Eos: 2 %
Hematocrit: 29.4 % — ABNORMAL LOW (ref 34.0–46.6)
Hemoglobin: 8.7 g/dL — ABNORMAL LOW (ref 11.1–15.9)
Immature Grans (Abs): 0 10*3/uL (ref 0.0–0.1)
Immature Granulocytes: 0 %
Lymphocytes Absolute: 1.7 10*3/uL (ref 0.7–3.1)
Lymphs: 29 %
MCH: 25.5 pg — ABNORMAL LOW (ref 26.6–33.0)
MCHC: 29.6 g/dL — ABNORMAL LOW (ref 31.5–35.7)
MCV: 86 fL (ref 79–97)
Monocytes Absolute: 0.7 10*3/uL (ref 0.1–0.9)
Monocytes: 13 %
Neutrophils Absolute: 3.3 10*3/uL (ref 1.4–7.0)
Neutrophils: 56 %
Platelets: 331 10*3/uL (ref 150–450)
RBC: 3.41 x10E6/uL — ABNORMAL LOW (ref 3.77–5.28)
RDW: 14.9 % (ref 11.7–15.4)
WBC: 5.8 10*3/uL (ref 3.4–10.8)

## 2021-11-27 LAB — IRON,TIBC AND FERRITIN PANEL
Ferritin: 11 ng/mL — ABNORMAL LOW (ref 15–150)
Iron Saturation: 6 % — CL (ref 15–55)
Iron: 26 ug/dL — ABNORMAL LOW (ref 27–159)
Total Iron Binding Capacity: 424 ug/dL (ref 250–450)
UIBC: 398 ug/dL (ref 131–425)

## 2021-11-27 LAB — LIPASE: Lipase: 33 U/L (ref 14–72)

## 2021-11-27 LAB — VITAMIN B12: Vitamin B-12: 784 pg/mL (ref 232–1245)

## 2021-11-27 LAB — FOLATE: Folate: >20 ng/mL (ref >3.0)

## 2021-11-29 ENCOUNTER — Ambulatory Visit: Payer: BC Managed Care – PPO | Admitting: Family Medicine

## 2021-11-29 ENCOUNTER — Other Ambulatory Visit: Payer: Self-pay | Admitting: Physician Assistant

## 2021-11-29 DIAGNOSIS — D509 Iron deficiency anemia, unspecified: Secondary | ICD-10-CM

## 2021-11-30 ENCOUNTER — Inpatient Hospital Stay: Payer: BC Managed Care – PPO | Admitting: Oncology

## 2021-12-01 ENCOUNTER — Telehealth: Payer: Self-pay | Admitting: *Deleted

## 2021-12-01 NOTE — Telephone Encounter (Signed)
Please advise 

## 2021-12-01 NOTE — Telephone Encounter (Signed)
Spoke with patient and she states she has appointment Monday with local office

## 2021-12-01 NOTE — Telephone Encounter (Signed)
Copied from [place]. Topic: Referral - Status >> Dec 01, 2021  9:38 AM Chapman Fitch wrote: Reason for CRM: [facility] cant see pt until December for iron transfusion / pt doesn't want to wait until then and needs to speak with Ria Comment about getting a sooner referral asap / please advise

## 2021-12-06 ENCOUNTER — Inpatient Hospital Stay: Payer: BC Managed Care – PPO

## 2021-12-06 ENCOUNTER — Encounter: Payer: Self-pay | Admitting: Oncology

## 2021-12-06 ENCOUNTER — Inpatient Hospital Stay: Payer: BC Managed Care – PPO | Attending: Oncology | Admitting: Oncology

## 2021-12-06 DIAGNOSIS — Z8051 Family history of malignant neoplasm of kidney: Secondary | ICD-10-CM | POA: Diagnosis not present

## 2021-12-06 DIAGNOSIS — Z801 Family history of malignant neoplasm of trachea, bronchus and lung: Secondary | ICD-10-CM | POA: Insufficient documentation

## 2021-12-06 DIAGNOSIS — D509 Iron deficiency anemia, unspecified: Secondary | ICD-10-CM | POA: Diagnosis present

## 2021-12-06 DIAGNOSIS — D508 Other iron deficiency anemias: Secondary | ICD-10-CM

## 2021-12-06 NOTE — Progress Notes (Signed)
Hematology/Oncology Consult note [phone] [phone]      Patient Care Team: Erasmo Downer, MD as PCP - General (Family Medicine)   REFERRING PROVIDER: Erasmo Downer, MD  CHIEF COMPLAINTS/REASON FOR VISIT:  Anemia  ASSESSMENT & PLAN:  IDA (iron deficiency anemia) Labs are reviewed and discussed with patient. I discussed about option of IV Venofer treatments. She has had Feraheme and Venofer in the past and tolerated well.  I discussed about the potential risks including but not limited to allergic reactions/infusion reactions including anaphylactic reactions, phlebitis, high blood pressure, wheezing, SOB, skin rash, weight gain, leg swelling, headache, nausea and fatigue, etc. Patient agrees with the Venofer treatments.  Plan IV venofer weekly x 5  Check celiac panel at next visit . Re-evaluate treatment response in 3 months.  Orders Placed This Encounter  Procedures   CBC with Differential/Platelet    Standing Status:   Future    Standing Expiration Date:   12/07/2022   Iron and TIBC    Standing Status:   Future    Standing Expiration Date:   12/07/2022   Ferritin    Standing Status:   Future    Standing Expiration Date:   12/07/2022   3 months. [name] MD  All questions were answered. The patient knows to call the clinic with any problems, questions or concerns.  Rickard Patience, MD, PhD [facility] Hematology Oncology 12/06/2021     HISTORY OF PRESENTING ILLNESS:  Annette Phillips is a  56 y.o.  female with PMH listed below who was referred to me for anemia Reviewed patient's recent labs that was done.  She was found to have abnormal CBC on 11/26/21 with Hb 8.7, mcv 86, Reviewed patient's previous labs ordered by primary care physician's office, anemia is chronic onset , duration is since 2021.  09/12/21, hospitalized due to near syncope, SON, Hb 5.4, sp blood transfusion and IV venofer . She tolerates well with no side effects.    09/14/21 EGD: Small hiatal hernia, nonobstructing Schatzki ring, stomach and duodenum normal.  Colonoscopy: Bleeding hemorrhoids grade 3.  10/08/21 capsule study negative.   + fatigue. + right side abdomen discomfort, intermittently. Recent US showed no acute finding. Fatty liver disease. History of cholecystectomy.  She denies recent chest pain on exertion, pre-syncopal episodes, or palpitations She had not noticed any recent bleeding such as epistaxis, hematuria or hematochezia. She is postmenopausal.  She denies over the counter NSAID ingestion.  She denies any pica and eats a variety of diet. Denies occupational toxin expose. She works as Careers adviser   MEDICAL HISTORY:  Past Medical History:  Diagnosis Date   Goiter    Hypothyroid    Prediabetes    Recurrent cold sores     SURGICAL HISTORY: Past Surgical History:  Procedure Laterality Date   APPENDECTOMY     BREAST BIOPSY Right 2015   benign   CHOLECYSTECTOMY N/A 02/28/2019   Procedure: LAPAROSCOPIC CHOLECYSTECTOMY;  Surgeon: Henrene Dodge, MD;  Location: [facility] ORS;  Service: General;  Laterality: N/A;   COLONOSCOPY WITH PROPOFOL N/A 09/14/2021   Procedure: COLONOSCOPY WITH PROPOFOL;  Surgeon: Midge Minium, MD;  Location: [facility] ENDOSCOPY;  Service: Endoscopy;  Laterality: N/A;   ESOPHAGOGASTRODUODENOSCOPY (EGD) WITH PROPOFOL N/A 02/27/2019   Procedure: ESOPHAGOGASTRODUODENOSCOPY (EGD) WITH PROPOFOL;  Surgeon: Toney Reil, MD;  Location: [facility] ENDOSCOPY;  Service: Gastroenterology;  Laterality: N/A;   ESOPHAGOGASTRODUODENOSCOPY (EGD) WITH PROPOFOL N/A 09/14/2021   Procedure: ESOPHAGOGASTRODUODENOSCOPY (EGD) WITH PROPOFOL;  Surgeon: Midge Minium, MD;  Location: [facility] ENDOSCOPY;  Service: Endoscopy;  Laterality: N/A;   GIVENS CAPSULE STUDY N/A 09/15/2021   Procedure: GIVENS CAPSULE STUDY;  Surgeon: Midge Minium, MD;  Location: [facility] ENDOSCOPY;  Service: Endoscopy;  Laterality: N/A;   KNEE ARTHROSCOPY Left  2008   torn meniscus    SOCIAL HISTORY: Social History   Socioeconomic History   Marital status: Divorced    Spouse name: Not on file   Number of children: 3   Years of education: Not on file   Highest education level: Not on file  Occupational History   Not on file  Tobacco Use   Smoking status: Never   Smokeless tobacco: Never  Vaping Use   Vaping Use: Never used  Substance and Sexual Activity   Alcohol use: Yes    Alcohol/week: 0.0 standard drinks of alcohol    Comment: rare   Drug use: Never   Sexual activity: Not Currently    Birth control/protection: Post-menopausal  Other Topics Concern   Not on file  Social History Narrative   Not on file   Social Determinants of Health   Financial Resource Strain: Not on file  Food Insecurity: Not on file  Transportation Needs: Not on file  Physical Activity: Not on file  Stress: Not on file  Social Connections: Not on file  Intimate Partner Violence: Not on file    FAMILY HISTORY: Family History  Problem Relation Age of Onset   Thyroid disease Mother    Diabetes Mellitus II Mother    Thyroid disease Father    Healthy Sister    Hypothyroidism Daughter    Hypothyroidism Son    Diabetes Mellitus I Son    Kidney cancer Maternal Grandmother    Lung cancer Maternal Grandfather        smoker   Colon cancer Neg Hx    Breast cancer Neg Hx     ALLERGIES:  is allergic to aspirin and penicillin g.  MEDICATIONS:  Current Outpatient Medications  Medication Sig Dispense Refill   docusate sodium (COLACE) 100 MG capsule Take 100 mg by mouth 2 (two) times daily.     levothyroxine (SYNTHROID) 100 MCG tablet Take 1 tablet (100 mcg total) by mouth daily before breakfast. (Patient taking differently: Take 100 mcg by mouth at bedtime.) 90 tablet 3   Multiple Vitamin (MULTI-VITAMINS) TABS Take 1 tablet by mouth in the morning.     No current facility-administered medications for this visit.    Review of Systems   Constitutional:  Positive for fatigue. Negative for appetite change, chills and fever.  HENT:   Negative for hearing loss and voice change.   Eyes:  Negative for eye problems.  Respiratory:  Negative for chest tightness and cough.   Cardiovascular:  Negative for chest pain.  Gastrointestinal:  Negative for abdominal distention, abdominal pain and blood in stool.       Right abdomen discomfort   Endocrine: Negative for hot flashes.  Genitourinary:  Negative for difficulty urinating and frequency.   Musculoskeletal:  Negative for arthralgias.  Skin:  Negative for itching and rash.  Neurological:  Negative for extremity weakness.  Hematological:  Negative for adenopathy.  Psychiatric/Behavioral:  Negative for confusion.     PHYSICAL EXAMINATION: ECOG PERFORMANCE STATUS: 1 - Symptomatic but completely ambulatory Vitals:   12/06/21 0947  BP: 119/87  Pulse: (!) 125  Resp: 18  Temp: 97.6 F (36.4 C)   Filed Weights   12/06/21 0947  Weight: 162  lb 11.2 oz (73.8 kg)    Physical Exam Constitutional:      General: She is not in acute distress. HENT:     Head: Normocephalic and atraumatic.  Eyes:     General: No scleral icterus. Cardiovascular:     Rate and Rhythm: Normal rate and regular rhythm.     Heart sounds: Normal heart sounds.  Pulmonary:     Effort: Pulmonary effort is normal. No respiratory distress.     Breath sounds: No wheezing.  Abdominal:     General: Bowel sounds are normal. There is no distension.     Palpations: Abdomen is soft.  Musculoskeletal:        General: No deformity. Normal range of motion.     Cervical back: Normal range of motion and neck supple.  Skin:    General: Skin is warm and dry.     Findings: No erythema or rash.  Neurological:     Mental Status: She is alert and oriented to person, place, and time. Mental status is at baseline.     Cranial Nerves: No cranial nerve deficit.     Coordination: Coordination normal.  Psychiatric:         Mood and Affect: Mood normal.      LABORATORY DATA:  I have reviewed the data as listed    Latest Ref Rng & Units 11/26/2021    1:36 PM 09/13/2021   11:48 PM 09/13/2021    1:53 AM  CBC  WBC 3.4 - 10.8 x10E3/uL 5.8   6.1   Hemoglobin 11.1 - 15.9 g/dL 8.7  56.2  7.7   Hematocrit 34.0 - 46.6 % 29.4  32.6  24.8   Platelets 150 - 450 x10E3/uL 331   249       Latest Ref Rng & Units 11/26/2021    1:36 PM 09/13/2021    5:21 AM 09/12/2021    3:44 PM  CMP  Glucose 70 - 99 mg/dL 94  96  130   BUN 6 - 24 mg/dL 19  9  14    Creatinine 0.57 - 1.00 mg/dL  8.65  7.84   Sodium 134 - 144 mmol/L 138  136  134   Potassium 3.5 - 5.2 mmol/L 4.3  4.0  3.8   Chloride 96 - 106 mmol/L 103  108  105   CO2 20 - 29 mmol/L 22  22  22    Calcium 8.7 - 10.2 mg/dL 9.3  8.4  8.8   Total Protein 6.0 - 8.5 g/dL 7.1   6.7   Total Bilirubin 0.0 - 1.2 mg/dL 0.3   0.4   Alkaline Phos 44 - 121 IU/L 73   41   AST 0 - 40 IU/L 23   22   ALT 0 - 32 IU/L 27   18       Component Value Date/Time   IRON 26 (L) 11/26/2021 1336   TIBC 424 11/26/2021 1336   FERRITIN 11 (L) 11/26/2021 1336   IRONPCTSAT 6 (LL) 11/26/2021 1336     RADIOGRAPHIC STUDIES: I have personally reviewed the radiological images as listed and agreed with the findings in the report. 11/28/2021 Abdomen Limited RUQ (LIVER/GB)  Result Date: 11/26/2021 CLINICAL DATA:  Right upper quadrant pain history of cholecystectomy EXAM: ULTRASOUND ABDOMEN LIMITED RIGHT UPPER QUADRANT COMPARISON:  02/26/2019 FINDINGS: Gallbladder: Status post cholecystectomy. Common bile duct: Diameter: 5.4 mm Liver: Slightly echogenic. No focal hepatic abnormality. Portal vein is patent on color Doppler imaging with normal  direction of blood flow towards the liver. Other: None. IMPRESSION: 1. Status post cholecystectomy.  No significant biliary dilatation 2. Slightly echogenic liver parenchyma suggests hepatic steatosis. Electronically Signed   By: Donavan Foil M.D.   On: 11/26/2021  15:57

## 2021-12-06 NOTE — Progress Notes (Signed)
Pt here to establish care for anemia.  °

## 2021-12-06 NOTE — Assessment & Plan Note (Signed)
Labs are reviewed and discussed with patient. I discussed about option of IV Venofer treatments. She has had Feraheme and Venofer in the past and tolerated well.  I discussed about the potential risks including but not limited to allergic reactions/infusion reactions including anaphylactic reactions, phlebitis, high blood pressure, wheezing, SOB, skin rash, weight gain, leg swelling, headache, nausea and fatigue, etc. Patient agrees with the Venofer treatments.  Plan IV venofer weekly x 5

## 2021-12-11 ENCOUNTER — Ambulatory Visit
Admission: EM | Admit: 2021-12-11 | Discharge: 2021-12-11 | Disposition: A | Discharge status: Home / Self Care | Payer: BC Managed Care – PPO | Attending: Physician Assistant | Admitting: Physician Assistant

## 2021-12-11 ENCOUNTER — Ambulatory Visit (INDEPENDENT_AMBULATORY_CARE_PROVIDER_SITE_OTHER): Payer: BC Managed Care – PPO

## 2021-12-11 ENCOUNTER — Encounter: Payer: Self-pay | Admitting: Emergency Medicine

## 2021-12-11 DIAGNOSIS — G5631 Lesion of radial nerve, right upper limb: Secondary | ICD-10-CM

## 2021-12-11 DIAGNOSIS — M25531 Pain in right wrist: Secondary | ICD-10-CM

## 2021-12-11 DIAGNOSIS — S63502A Unspecified sprain of left wrist, initial encounter: Secondary | ICD-10-CM

## 2021-12-11 NOTE — ED Provider Notes (Signed)
[facility] - [place], [place]   Name: Annette Phillips DOB: April 17, 1965 MRN: [id number] CSN: [id number] PCP: Erasmo Downer, MD  Arrival date and time:  12/11/21 1121  Chief Complaint:  Wrist Pain (right)   NOTE: Prior to seeing the patient today, I have reviewed the triage nursing documentation and vital signs. Clinical staff has updated patient's PMH/PSHx, current medication list, and drug allergies/intolerances to ensure comprehensive history available to assist in medical decision making.   History:   HPI: Annette Phillips is a 56 y.o. female who presents today with complaints of wrist pain x1 week.  Patient was picking up her grandson a week ago when she felt immediate pain to her right wrist.  She describes this pain as sharp and shooting, radiating up to her elbow.  Over the past week, she has been attempting to treat the pain with rest and as needed ibuprofen, but the pain has continued to be right worse.  She denies any previous injury to that hand, wrist or arm.  She states the pain continues to be an occasional sharp shooting pain that is worsening with any type of movement or utilization.   Past Medical History:  Diagnosis Date   Goiter    Hypothyroid    Prediabetes    Recurrent cold sores     Past Surgical History:  Procedure Laterality Date   APPENDECTOMY     BREAST BIOPSY Right 2015   benign   CHOLECYSTECTOMY N/A 02/28/2019   Procedure: LAPAROSCOPIC CHOLECYSTECTOMY;  Surgeon: Henrene Dodge, MD;  Location: [facility] ORS;  Service: General;  Laterality: N/A;   COLONOSCOPY WITH PROPOFOL N/A 09/14/2021   Procedure: COLONOSCOPY WITH PROPOFOL;  Surgeon: Midge Minium, MD;  Location: [facility] ENDOSCOPY;  Service: Endoscopy;  Laterality: N/A;   ESOPHAGOGASTRODUODENOSCOPY (EGD) WITH PROPOFOL N/A 02/27/2019   Procedure: ESOPHAGOGASTRODUODENOSCOPY (EGD) WITH PROPOFOL;  Surgeon: Toney Reil, MD;  Location: [facility] ENDOSCOPY;  Service: Gastroenterology;  Laterality: N/A;    ESOPHAGOGASTRODUODENOSCOPY (EGD) WITH PROPOFOL N/A 09/14/2021   Procedure: ESOPHAGOGASTRODUODENOSCOPY (EGD) WITH PROPOFOL;  Surgeon: Midge Minium, MD;  Location: [facility] ENDOSCOPY;  Service: Endoscopy;  Laterality: N/A;   GIVENS CAPSULE STUDY N/A 09/15/2021   Procedure: GIVENS CAPSULE STUDY;  Surgeon: Midge Minium, MD;  Location: [facility] ENDOSCOPY;  Service: Endoscopy;  Laterality: N/A;   KNEE ARTHROSCOPY Left 2008   torn meniscus    Family History  Problem Relation Age of Onset   Thyroid disease Mother    Diabetes Mellitus II Mother    Thyroid disease Father    Healthy Sister    Hypothyroidism Daughter    Hypothyroidism Son    Diabetes Mellitus I Son    Kidney cancer Maternal Grandmother    Lung cancer Maternal Grandfather        smoker   Colon cancer Neg Hx    Breast cancer Neg Hx     Social History   Tobacco Use   Smoking status: Never   Smokeless tobacco: Never  Vaping Use   Vaping Use: Never used  Substance Use Topics   Alcohol use: Yes    Alcohol/week: 0.0 standard drinks of alcohol    Comment: rare   Drug use: Never    Patient Active Problem List   Diagnosis Date Noted   IDA (iron deficiency anemia) 12/06/2021   Symptomatic anemia 09/12/2021   Sinus tachycardia 09/12/2021   Elevated lactic acid level 09/12/2021   Hemorrhoids 07/16/2020   Elevated liver enzymes 07/16/2020   Esophagitis 02/26/2019   Family history  of diabetes mellitus 01/30/2019   Hypothyroid     Home Medications:    Current Meds  Medication Sig   docusate sodium (COLACE) 100 MG capsule Take 100 mg by mouth 2 (two) times daily.   levothyroxine (SYNTHROID) 100 MCG tablet Take 1 tablet (100 mcg total) by mouth daily before breakfast. (Patient taking differently: Take 100 mcg by mouth at bedtime.)   Multiple Vitamin (MULTI-VITAMINS) TABS Take 1 tablet by mouth in the morning.    Allergies:   Aspirin and Penicillin g  Review of Systems (ROS): Review of Systems  Musculoskeletal:  Positive for  arthralgias and myalgias.  Neurological:  Negative for tremors, weakness and numbness.  All other systems reviewed and are negative.    Vital Signs: Today's Vitals   12/11/21 1202 12/11/21 1205  BP:  (!) 133/91  Pulse:  (!) 110  Resp:  14  Temp:  98 F (36.7 C)  TempSrc:  Oral  SpO2:  98%  Weight: 160 lb (72.6 kg)   Height: 5\' 7"  (1.702 m)   PainSc: 4      Physical Exam: Physical Exam Vitals and nursing note reviewed.  Constitutional:      Appearance: Normal appearance.  Cardiovascular:     Rate and Rhythm: Normal rate and regular rhythm.     Pulses: Normal pulses.          Radial pulses are 2+ on the right side and 2+ on the left side.     Heart sounds: Normal heart sounds.  Pulmonary:     Effort: Pulmonary effort is normal.     Breath sounds: Normal breath sounds.  Musculoskeletal:     Right wrist: Tenderness present. No snuff box tenderness. Decreased range of motion.     Comments: Tenderness at the base of radius.  Patient endorses pain with thumbs up motion, wrist supination and pronation.  No visible swelling or erythema.  Skin:    General: Skin is warm and dry.  Neurological:     General: No focal deficit present.     Mental Status: She is alert and oriented to person, place, and time.  Psychiatric:        Mood and Affect: Mood normal.        Behavior: Behavior normal.      Urgent Care Treatments / Results:   LABS: PLEASE NOTE: all labs that were ordered this encounter are listed, however only abnormal results are displayed. Labs Reviewed - No data to display  EKG: -None  RADIOLOGY: DG Wrist Complete Right  Result Date: 12/11/2021 CLINICAL DATA:  Right wrist pain.  Injury 1 week ago. EXAM: RIGHT WRIST - COMPLETE 3+ VIEW COMPARISON:  None Available. FINDINGS: There is no evidence of fracture or dislocation. There is no evidence of arthropathy or other focal bone abnormality. Soft tissues are unremarkable. IMPRESSION: Negative. Electronically Signed    By: 12/13/2021 M.D.   On: 12/11/2021 12:41    PROCEDURES: Procedures  MEDICATIONS RECEIVED THIS VISIT: Medications - No data to display  PERTINENT CLINICAL COURSE NOTES/UPDATES:   Initial Impression / Assessment and Plan / Urgent Care Course:  Pertinent labs & imaging results that were available during my care of the patient were personally reviewed by me and considered in my medical decision making (see lab/imaging section of note for values and interpretations).  Annette Phillips is a 56 y.o. female who presents to [facility] today with complaints of wrist pain, diagnosed with possible radial nerve irritation, and treated as  such with the medications below. NP and patient reviewed discharge instructions below during visit.   Patient is well appearing overall in clinic today. She does not appear to be in any acute distress. Presenting symptoms (see HPI) and exam as documented above.   I have reviewed the follow up and strict return precautions for any new or worsening symptoms. Patient is aware of symptoms that would be deemed urgent/emergent, and would thus require further evaluation either here or in the emergency department. At the time of discharge, she verbalized understanding and consent with the discharge plan as it was reviewed with her. All questions were fielded by provider and/or clinic staff prior to patient discharge.    Final Clinical Impressions / Urgent Care Diagnoses:   Final diagnoses:  Irritation of right radial nerve  Sprain of left wrist, initial encounter    New Prescriptions:  [place] Controlled Substance Registry consulted? Not Applicable  No orders of the defined types were placed in this encounter.     Discharge Instructions      You were seen for wrist pain and are being treated for nerve irritation.   -No fractures were noted in your x-ray. -Continue treating pain as needed with ibuprofen. -A brace may be beneficial for activities where you will  be using your wrist frequently.  Take care, Dr. Marland Kitchen, NP-c     Recommended Follow up Care:  Patient encouraged to follow up with the following provider within the specified time frame, or sooner as dictated by the severity of her symptoms. As always, she was instructed that for any urgent/emergent care needs, she should seek care either here or in the emergency department for more immediate evaluation.   Gertie Baron, DNP, NP-c   Gertie Baron, NP 12/11/21 [phone]

## 2021-12-11 NOTE — Discharge Instructions (Signed)
You were seen for wrist pain and are being treated for nerve irritation.   -No fractures were noted in your x-ray. -Continue treating pain as needed with ibuprofen. -A brace may be beneficial for activities where you will be using your wrist frequently.  Take care, Dr. Marland Kitchen, NP-c

## 2021-12-11 NOTE — ED Triage Notes (Signed)
Patient states that she was picking up her grandson a week ago and felt a sharp pain in her right wrist.  Patient states that she is still having pain in her right wrist especially when she moves her right thumb.  Patient states that she will be started iron infusions next week.

## 2021-12-14 ENCOUNTER — Inpatient Hospital Stay: Payer: BC Managed Care – PPO

## 2021-12-14 VITALS — BP 121/84 | HR 113 | Temp 97.8°F | Resp 105

## 2021-12-14 DIAGNOSIS — D509 Iron deficiency anemia, unspecified: Secondary | ICD-10-CM | POA: Diagnosis not present

## 2021-12-14 DIAGNOSIS — D508 Other iron deficiency anemias: Secondary | ICD-10-CM

## 2021-12-14 MED ORDER — SODIUM CHLORIDE 0.9 % IV SOLN
Freq: Once | INTRAVENOUS | Status: AC
  Filled 2021-12-14: qty 250

## 2021-12-14 MED ORDER — SODIUM CHLORIDE 0.9 % IV SOLN
200.0000 mg | Freq: Once | INTRAVENOUS | Status: AC
  Administered 2021-12-14: 200 mg via INTRAVENOUS
  Filled 2021-12-14: qty 200

## 2021-12-14 NOTE — Patient Instructions (Signed)

## 2021-12-21 ENCOUNTER — Inpatient Hospital Stay: Payer: BC Managed Care – PPO | Attending: Oncology

## 2021-12-21 VITALS — BP 125/77 | HR 107 | Temp 98.7°F | Resp 17

## 2021-12-21 DIAGNOSIS — D509 Iron deficiency anemia, unspecified: Secondary | ICD-10-CM | POA: Insufficient documentation

## 2021-12-21 DIAGNOSIS — D508 Other iron deficiency anemias: Secondary | ICD-10-CM

## 2021-12-21 MED ORDER — SODIUM CHLORIDE 0.9 % IV SOLN
200.0000 mg | Freq: Once | INTRAVENOUS | Status: AC
  Administered 2021-12-21: 200 mg via INTRAVENOUS
  Filled 2021-12-21: qty 200

## 2021-12-21 MED ORDER — SODIUM CHLORIDE 0.9 % IV SOLN
Freq: Once | INTRAVENOUS | Status: AC
  Filled 2021-12-21: qty 250

## 2021-12-28 ENCOUNTER — Inpatient Hospital Stay: Payer: BC Managed Care – PPO

## 2021-12-28 VITALS — BP 127/82 | HR 110 | Resp 16

## 2021-12-28 DIAGNOSIS — D508 Other iron deficiency anemias: Secondary | ICD-10-CM

## 2021-12-28 DIAGNOSIS — D509 Iron deficiency anemia, unspecified: Secondary | ICD-10-CM | POA: Diagnosis not present

## 2021-12-28 MED ORDER — SODIUM CHLORIDE 0.9 % IV SOLN
200.0000 mg | Freq: Once | INTRAVENOUS | Status: AC
  Administered 2021-12-28: 200 mg via INTRAVENOUS
  Filled 2021-12-28: qty 200

## 2021-12-28 MED ORDER — SODIUM CHLORIDE 0.9 % IV SOLN
Freq: Once | INTRAVENOUS | Status: AC
  Filled 2021-12-28: qty 250

## 2021-12-28 NOTE — Progress Notes (Signed)
Pt refused post infusion monitoring.

## 2022-01-04 ENCOUNTER — Inpatient Hospital Stay: Payer: BC Managed Care – PPO

## 2022-01-04 VITALS — BP 131/83 | HR 100 | Temp 97.1°F | Resp 16

## 2022-01-04 DIAGNOSIS — D508 Other iron deficiency anemias: Secondary | ICD-10-CM

## 2022-01-04 DIAGNOSIS — D509 Iron deficiency anemia, unspecified: Secondary | ICD-10-CM | POA: Diagnosis not present

## 2022-01-04 MED ORDER — SODIUM CHLORIDE 0.9 % IV SOLN
Freq: Once | INTRAVENOUS | Status: AC
  Filled 2022-01-04: qty 250

## 2022-01-04 MED ORDER — SODIUM CHLORIDE 0.9 % IV SOLN
200.0000 mg | Freq: Once | INTRAVENOUS | Status: AC
  Administered 2022-01-04: 200 mg via INTRAVENOUS
  Filled 2022-01-04: qty 200

## 2022-01-11 ENCOUNTER — Inpatient Hospital Stay: Payer: BC Managed Care – PPO

## 2022-01-11 VITALS — BP 131/89 | HR 103 | Temp 97.4°F | Resp 16

## 2022-01-11 DIAGNOSIS — D509 Iron deficiency anemia, unspecified: Secondary | ICD-10-CM | POA: Diagnosis not present

## 2022-01-11 DIAGNOSIS — D508 Other iron deficiency anemias: Secondary | ICD-10-CM

## 2022-01-11 MED ORDER — SODIUM CHLORIDE 0.9 % IV SOLN
Freq: Once | INTRAVENOUS | Status: AC
  Filled 2022-01-11: qty 250

## 2022-01-11 MED ORDER — SODIUM CHLORIDE 0.9 % IV SOLN
200.0000 mg | Freq: Once | INTRAVENOUS | Status: AC
  Administered 2022-01-11: 200 mg via INTRAVENOUS
  Filled 2022-01-11: qty 200

## 2022-02-17 ENCOUNTER — Ambulatory Visit: Payer: Self-pay

## 2022-02-17 NOTE — Telephone Encounter (Signed)
Patient called, left VM to return the call to the office to discuss symptoms with a nurse.  Summary: pain in right wrist that shoots up her arm, also has bruise and knot on wrist.   Patient says has pain in right wrist that shoots up her arm, also has bruise and knot on wrist.

## 2022-02-17 NOTE — Telephone Encounter (Signed)
Summary: pain in right wrist that shoots up her arm, also has bruise and knot on wrist.   Patient says has pain in right wrist that shoots up her arm, also has bruise and knot on wrist.         Chief Complaint: right wrist pain worsening  Symptoms: right wrist pain worsening. Pain shoots up arm from wrist . Bruising noted with "a knot".  Difficulty using right hand / wrist brushing hair causes pain lifting full water bottle. Frequency: started in October 2023 and seen in UC and gave 6 weeks to heal and still causes pain  Pertinent Negatives: Patient denies redness no swelling, no fever. Disposition: [] ED /[] Urgent Care (no appt availability in office) / [x] Appointment(In office/virtual)/ []  [place] [facility]/ [] Home Care/ [] Refused Recommended Disposition /[] [employer] [facility]/ []  Follow-up with PCP Additional Notes: appt scheduled for 02/21/22. Please advise if earlier appt available .     Reason for Disposition  [1] MODERATE pain (e.g., interferes with normal activities) AND [2] present > 3 days  Answer Assessment - Initial Assessment Questions 1. ONSET: "When did the pain start?"     October 2. LOCATION: "Where is the pain located?"     Right wrist thumb side  3. PAIN: "How bad is the pain?" (Scale 1-10; or mild, moderate, severe)   - MILD (1-3): doesn't interfere with normal activities   - MODERATE (4-7): interferes with normal activities (e.g., work or school) or awakens from sleep   - SEVERE (8-10): excruciating pain, unable to use hand at all     Getting worse with ADL's 4. WORK OR EXERCISE: "Has there been any recent work or exercise that involved this part (i.e., hand or wrist) of the body?"     na 5. CAUSE: "What do you think is causing the pain?"     Picked up grandchild and allowed to rest for 6 weeks now getting worse 6. AGGRAVATING FACTORS: "What makes the pain worse?" (e.g., using computer)     Brushing hair picking ,up water bottle that is full.  7. OTHER  SYMPTOMS: "Do you have any other symptoms?" (e.g., neck pain, swelling, rash, numbness, fever)     Right wrist pain with picking up objects ex full water bottlle . Pain around wrist area toward elbow .  8. PREGNANCY: "Is there any chance you are pregnant?" "When was your last menstrual period?"     na  Protocols used: Hand and Wrist Pain-A-AH

## 2022-02-17 NOTE — Telephone Encounter (Signed)
Noted  

## 2022-02-21 ENCOUNTER — Ambulatory Visit
Admission: RE | Admit: 2022-02-21 | Discharge: 2022-02-21 | Disposition: A | Discharge status: Home / Self Care | Payer: BC Managed Care – PPO | Source: Ambulatory Visit | Attending: Family Medicine | Admitting: Family Medicine

## 2022-02-21 ENCOUNTER — Ambulatory Visit
Admission: RE | Admit: 2022-02-21 | Discharge: 2022-02-21 | Disposition: A | Discharge status: Home / Self Care | Payer: BC Managed Care – PPO | Attending: Family Medicine | Admitting: Family Medicine

## 2022-02-21 ENCOUNTER — Ambulatory Visit: Payer: BC Managed Care – PPO | Admitting: Family Medicine

## 2022-02-21 ENCOUNTER — Encounter: Payer: Self-pay | Admitting: Family Medicine

## 2022-02-21 ENCOUNTER — Other Ambulatory Visit: Payer: Self-pay | Admitting: Family Medicine

## 2022-02-21 VITALS — BP 118/79 | HR 128 | Temp 97.2°F | Resp 16 | Wt 161.5 lb

## 2022-02-21 DIAGNOSIS — M25531 Pain in right wrist: Secondary | ICD-10-CM

## 2022-02-21 DIAGNOSIS — G8929 Other chronic pain: Secondary | ICD-10-CM | POA: Diagnosis present

## 2022-02-21 DIAGNOSIS — D649 Anemia, unspecified: Secondary | ICD-10-CM | POA: Diagnosis not present

## 2022-02-21 DIAGNOSIS — R000 Tachycardia, unspecified: Secondary | ICD-10-CM

## 2022-02-21 DIAGNOSIS — E039 Hypothyroidism, unspecified: Secondary | ICD-10-CM | POA: Diagnosis not present

## 2022-02-21 NOTE — Assessment & Plan Note (Signed)
Following lifting pain with grandchild with popping sensation; xray previously obtained showing edema Conservative plan carried out; has not improved Now using NSAIDs 2x/day Cannot press, hold/grip/wipe with R hand; R hand dominant  Interfering with home and work lives Reports difficulty sleeping at night d/t pain, numbness at end of day- not relieved with NSAIDs, and concern for shape change and possible long term bruising Repeat Xray- plan for MRI as needed based on Xrays

## 2022-02-21 NOTE — Progress Notes (Signed)
I,Joseline E Rosas,acting as a scribe for Jacky Kindle, FNP.,have documented all relevant documentation on the behalf of Jacky Kindle, FNP,as directed by  Jacky Kindle, FNP while in the presence of Jacky Kindle, FNP.   Established patient visit   Patient: Annette Phillips   DOB: [birth year]   57 y.o. Female  MRN: [id number] Visit Date: 02/21/2022  Today's healthcare provider: Jacky Kindle, FNP   Chief Complaint  Patient presents with   Wrist Pain   Subjective    Wrist Pain  The pain is present in the right wrist. This is a recurrent problem. The current episode started more than 1 month ago (Hurt it back in October). There has been a history of trauma. The problem occurs constantly. The quality of the pain is described as aching and sharp. The pain is at a severity of 7/10. The pain is moderate. Associated symptoms include an inability to bear weight, a limited range of motion and tingling. The symptoms are aggravated by activity and contact. She has tried NSAIDS (Brace. She had imaging done at the end of October) for the symptoms.     Medications: Outpatient Medications Prior to Visit  Medication Sig   docusate sodium (COLACE) 100 MG capsule Take 100 mg by mouth 2 (two) times daily.   levothyroxine (SYNTHROID) 100 MCG tablet Take 1 tablet (100 mcg total) by mouth daily before breakfast. (Patient taking differently: Take 100 mcg by mouth at bedtime.)   Multiple Vitamin (MULTI-VITAMINS) TABS Take 1 tablet by mouth in the morning.   No facility-administered medications prior to visit.    Review of Systems  Neurological:  Positive for tingling.     Objective    BP 118/79 (BP Location: Left Arm, Patient Position: Sitting, Cuff Size: Normal)   Pulse (!) 128   Temp (!) 97.2 F (36.2 C) (Oral)   Resp 16   Wt 161 lb 8 oz (73.3 kg)   BMI 25.29 kg/m   Physical Exam Vitals and nursing note reviewed.  Constitutional:      General: She is not in acute distress.    Appearance:  Normal appearance. She is normal weight. She is not ill-appearing, toxic-appearing or diaphoretic.  HENT:     Head: Normocephalic and atraumatic.  Cardiovascular:     Rate and Rhythm: Regular rhythm. Tachycardia present.     Pulses: Normal pulses.     Heart sounds: Normal heart sounds. No murmur heard.    No friction rub. No gallop.  Pulmonary:     Effort: Pulmonary effort is normal. No respiratory distress.     Breath sounds: Normal breath sounds. No stridor. No wheezing, rhonchi or rales.  Chest:     Chest wall: No tenderness.  Musculoskeletal:        General: Swelling, tenderness and signs of injury present. No deformity.       Arms:     Right lower leg: No edema.     Left lower leg: No edema.     Comments: Red line- indicates were pain subsides, purple- indicates concern for bruising, arrow indicates concern for knot/bump  Skin:    General: Skin is warm and dry.     Capillary Refill: Capillary refill takes less than 2 seconds.     Coloration: Skin is not jaundiced or pale.     Findings: No bruising, erythema, lesion or rash.  Neurological:     General: No focal deficit present.     Mental Status:  She is alert and oriented to person, place, and time. Mental status is at baseline.     Cranial Nerves: No cranial nerve deficit.     Sensory: No sensory deficit.     Motor: No weakness.     Coordination: Coordination normal.  Psychiatric:        Mood and Affect: Mood normal.        Behavior: Behavior normal.        Thought Content: Thought content normal.        Judgment: Judgment normal.     No results found for any visits on 02/21/22.  Assessment & Plan     Problem List Items Addressed This Visit       Endocrine   Hypothyroid    Chronic, previously over corrected Now with ST; hx of ST with anemia Has received anemia assistance thru hematology thru infusions  Declines obtaining additional labs today given here for wrist pain and plans to f/u with hematology          Other   Chronic pain of right wrist - Primary    Following lifting pain with grandchild with popping sensation; xray previously obtained showing edema Conservative plan carried out; has not improved Now using NSAIDs 2x/day Cannot press, hold/grip/wipe with R hand; R hand dominant  Interfering with home and work lives Reports difficulty sleeping at night d/t pain, numbness at end of day- not relieved with NSAIDs, and concern for shape change and possible long term bruising Repeat Xray- plan for MRI as needed based on Xrays      Relevant Orders   MR WRIST RIGHT W WO CONTRAST   DG Wrist Complete Right   Sinus tachycardia    Acute on chronic, possibly linked to previous anemia, overcorrected hypothyroidism, anxiety or pain Normal rhythm; defer ECG given chronic nature of ST      Symptomatic anemia    Chronic, Now with elevated ST; hx of ST with anemia. Previous EKGs reviewed Has received anemia assistance thru hematology thru infusions  Declines obtaining additional labs today given here for wrist pain and plans to f/u with hematology. Pt reports pain in R wrist 7/10 and not improved in 3 month's time.      Return if symptoms worsen or fail to improve.      Vonna Kotyk, FNP, have reviewed all documentation for this visit. The documentation on 02/21/22 for the exam, diagnosis, procedures, and orders are all accurate and complete.  Gwyneth Sprout, [place] [phone] (phone) [phone] (fax)  [place]

## 2022-02-21 NOTE — Assessment & Plan Note (Signed)
Chronic, previously over corrected Now with ST; hx of ST with anemia Has received anemia assistance thru hematology thru infusions  Declines obtaining additional labs today given here for wrist pain and plans to f/u with hematology

## 2022-02-21 NOTE — Progress Notes (Signed)
Negative wrist xray- 3 views. Referral placed to Ortho given MRI was also ordered; no need to expedite MRI given normal Xrays. Ortho referral placed for local area for second opinion. Pt may wish to wait to establish with ortho as they may want to use their own radiology team for MRI. Thanks,

## 2022-02-21 NOTE — Assessment & Plan Note (Signed)
Acute on chronic, possibly linked to previous anemia, overcorrected hypothyroidism, anxiety or pain Normal rhythm; defer ECG given chronic nature of ST

## 2022-02-21 NOTE — Patient Instructions (Signed)
[facility] [place],  [place]  [phone]  Proceed with Xray; will we call with plans for MRI

## 2022-02-21 NOTE — Assessment & Plan Note (Signed)
Chronic, Now with elevated ST; hx of ST with anemia. Previous EKGs reviewed Has received anemia assistance thru hematology thru infusions  Declines obtaining additional labs today given here for wrist pain and plans to f/u with hematology. Pt reports pain in R wrist 7/10 and not improved in 3 month's time.

## 2022-02-22 ENCOUNTER — Telehealth: Payer: Self-pay

## 2022-02-22 NOTE — Telephone Encounter (Signed)
Patient advised. States she would rather not because that would just create another charge for them to advise her to f/u with PCP would like to know how long it will take for order to be authorized.   Please advise

## 2022-02-22 NOTE — Telephone Encounter (Signed)
Copied from [place] [phone]. Topic: General - Other >> Feb 22, 2022 11:36 AM Cyndi Bender wrote: Reason for CRM: Pt requests call back with an update on the MRI that was ordered for her wrist because it is getting worse. Pt stated she can barely put her car in park due to the wrist pain. Cb# [phone]

## 2022-02-24 ENCOUNTER — Telehealth: Payer: Self-pay

## 2022-02-24 ENCOUNTER — Other Ambulatory Visit: Payer: Self-pay | Admitting: Family Medicine

## 2022-02-24 NOTE — Telephone Encounter (Signed)
Copied from [place] [phone]. Topic: General - Other >> Feb 24, 2022 10:15 AM Burman Freestone wrote: Reason for CRM: [employer] is requesting a peer to peer to get MRI approved Please contact Ramona at [phone] Pt's member # is [id number]

## 2022-02-25 NOTE — Telephone Encounter (Signed)
Noted  KP 

## 2022-02-28 ENCOUNTER — Ambulatory Visit
Admission: RE | Admit: 2022-02-28 | Discharge: 2022-02-28 | Disposition: A | Discharge status: Home / Self Care | Payer: BC Managed Care – PPO | Source: Ambulatory Visit | Attending: Family Medicine | Admitting: Family Medicine

## 2022-02-28 DIAGNOSIS — M25531 Pain in right wrist: Secondary | ICD-10-CM | POA: Insufficient documentation

## 2022-02-28 DIAGNOSIS — G8929 Other chronic pain: Secondary | ICD-10-CM | POA: Diagnosis present

## 2022-02-28 MED ORDER — GADOBUTROL 1 MMOL/ML IV SOLN
7.0000 mL | Freq: Once | INTRAVENOUS | Status: AC | PRN
  Administered 2022-02-28: 7 mL via INTRAVENOUS

## 2022-03-01 NOTE — Progress Notes (Signed)
Continue to recommend follow up with orthopedist given poor mobility and ADL limitation with tenosynovitis as well as possible tears of two tendons.

## 2022-03-07 ENCOUNTER — Inpatient Hospital Stay: Payer: BC Managed Care – PPO | Attending: Oncology

## 2022-03-07 DIAGNOSIS — D508 Other iron deficiency anemias: Secondary | ICD-10-CM

## 2022-03-07 DIAGNOSIS — K649 Unspecified hemorrhoids: Secondary | ICD-10-CM | POA: Diagnosis not present

## 2022-03-07 DIAGNOSIS — D509 Iron deficiency anemia, unspecified: Secondary | ICD-10-CM | POA: Diagnosis present

## 2022-03-07 LAB — CBC WITH DIFFERENTIAL/PLATELET
Abs Immature Granulocytes: 0.01 10*3/uL (ref 0.00–0.07)
Basophils Absolute: 0 10*3/uL (ref 0.0–0.1)
Basophils Relative: 1 %
Eosinophils Absolute: 0.1 10*3/uL (ref 0.0–0.5)
Eosinophils Relative: 2 %
HCT: 28 % — ABNORMAL LOW (ref 36.0–46.0)
Hemoglobin: 9.2 g/dL — ABNORMAL LOW (ref 12.0–15.0)
Immature Granulocytes: 0 %
Lymphocytes Relative: 29 %
Lymphs Abs: 1.5 10*3/uL (ref 0.7–4.0)
MCH: 27.4 pg (ref 26.0–34.0)
MCHC: 32.9 g/dL (ref 30.0–36.0)
MCV: 83.3 fL (ref 80.0–100.0)
Monocytes Absolute: 0.7 10*3/uL (ref 0.1–1.0)
Monocytes Relative: 13 %
Neutro Abs: 3 10*3/uL (ref 1.7–7.7)
Neutrophils Relative %: 55 %
Platelets: 289 10*3/uL (ref 150–400)
RBC: 3.36 MIL/uL — ABNORMAL LOW (ref 3.87–5.11)
RDW: 14.6 % (ref 11.5–15.5)
WBC: 5.3 10*3/uL (ref 4.0–10.5)
nRBC: 0 % (ref 0.0–0.2)

## 2022-03-07 LAB — IRON AND TIBC
Iron: 22 ug/dL — ABNORMAL LOW (ref 28–170)
Saturation Ratios: 5 % — ABNORMAL LOW (ref 10.4–31.8)
TIBC: 447 ug/dL (ref 250–450)
UIBC: 425 ug/dL

## 2022-03-07 LAB — FERRITIN: Ferritin: 11 ng/mL (ref 11–307)

## 2022-03-08 ENCOUNTER — Inpatient Hospital Stay: Payer: BC Managed Care – PPO

## 2022-03-09 LAB — CELIAC PANEL 10
Antigliadin Abs, IgA: 8 units (ref 0–19)
Endomysial Ab, IgA: NEGATIVE
Gliadin IgG: 3 units (ref 0–19)
IgA: 110 mg/dL (ref 87–352)
Tissue Transglut Ab: <2 U/mL (ref 0–5)
Tissue Transglutaminase Ab, IgA: <2 U/mL (ref 0–3)

## 2022-03-11 ENCOUNTER — Encounter: Payer: Self-pay | Admitting: Oncology

## 2022-03-11 ENCOUNTER — Inpatient Hospital Stay (HOSPITAL_BASED_OUTPATIENT_CLINIC_OR_DEPARTMENT_OTHER): Payer: BC Managed Care – PPO | Admitting: Oncology

## 2022-03-11 DIAGNOSIS — D508 Other iron deficiency anemias: Secondary | ICD-10-CM | POA: Diagnosis not present

## 2022-03-11 DIAGNOSIS — K625 Hemorrhage of anus and rectum: Secondary | ICD-10-CM | POA: Diagnosis not present

## 2022-03-11 DIAGNOSIS — K648 Other hemorrhoids: Secondary | ICD-10-CM

## 2022-03-11 DIAGNOSIS — Z801 Family history of malignant neoplasm of trachea, bronchus and lung: Secondary | ICD-10-CM | POA: Diagnosis not present

## 2022-03-11 NOTE — Assessment & Plan Note (Signed)
She continues to have rectal bleeding,.  Refer to GI for further evaluation.

## 2022-03-11 NOTE — Assessment & Plan Note (Signed)
Labs are reviewed and discussed with patient. Improved hemoglobin, still low. Iron panel is also still decreased.  Recommend IV venofer Q2 weeks x 4

## 2022-03-11 NOTE — Progress Notes (Signed)
HEMATOLOGY-ONCOLOGY TeleHEALTH VISIT PROGRESS NOTE  I connected with Annette Phillips on 03/11/22  at 12:15 PM EST by video enabled telemedicine visit and verified that I am speaking with the correct person using two identifiers. I discussed the limitations, risks, security and privacy concerns of performing an evaluation and management service by telemedicine and the availability of in-person appointments. The patient expressed understanding and agreed to proceed.   Other persons participating in the visit and their role in the encounter:  None  Patient's location: Home  Provider's location: office Chief Complaint: Annette Phillips is a 57 y.o. female who has above history reviewed by me today presents for follow up visit for management of  Problems and complaints are listed below:  She tolerates previous IV Venofer treatments.  Some improvement of fatigue.  Still has rectal bleeding due to hemorrhoids.   Review of Systems  Constitutional:  Positive for fatigue. Negative for appetite change, chills and fever.  HENT:   Negative for hearing loss and voice change.   Eyes:  Negative for eye problems.  Respiratory:  Negative for chest tightness and cough.   Cardiovascular:  Negative for chest pain.  Gastrointestinal:  Positive for blood in stool. Negative for abdominal distention and abdominal pain.       Hemorrhoid  Endocrine: Negative for hot flashes.  Genitourinary:  Negative for difficulty urinating and frequency.   Musculoskeletal:  Negative for arthralgias.  Skin:  Negative for itching and rash.  Neurological:  Negative for extremity weakness.  Hematological:  Negative for adenopathy.  Psychiatric/Behavioral:  Negative for confusion.     Past Medical History:  Diagnosis Date   Goiter    Hypothyroid    Prediabetes    Recurrent cold sores    Past Surgical History:  Procedure Laterality Date   APPENDECTOMY     BREAST BIOPSY Right 2015   benign    CHOLECYSTECTOMY N/A 02/28/2019   Procedure: LAPAROSCOPIC CHOLECYSTECTOMY;  Surgeon: Olean Ree, MD;  Location: [facility] ORS;  Service: General;  Laterality: N/A;   COLONOSCOPY WITH PROPOFOL N/A 09/14/2021   Procedure: COLONOSCOPY WITH PROPOFOL;  Surgeon: Lucilla Lame, MD;  Location: [facility] ENDOSCOPY;  Service: Endoscopy;  Laterality: N/A;   ESOPHAGOGASTRODUODENOSCOPY (EGD) WITH PROPOFOL N/A 02/27/2019   Procedure: ESOPHAGOGASTRODUODENOSCOPY (EGD) WITH PROPOFOL;  Surgeon: Lin Landsman, MD;  Location: [facility] END[facility]COPY;  Service: Gastroenterology;  Laterality: N/A;   ESOPHAGOGASTRODUODENOSCOPY (EGD) WITH PROPOFOL N/A 09/14/2021   Procedure: ESOPHAGOGASTRODUODENOSCOPY (EGD) WITH PROPOFOL;  Surgeon: Lucilla Lame, MD;  Location: [facility] ENDOSCOPY;  Service: Endoscopy;  Laterality: N/A;   GIVENS CAPSULE STUDY N/A 09/15/2021   Procedure: GIVENS CAPSULE STUDY;  Surgeon: Lucilla Lame, MD;  Location: [facility] ENDOSCOPY;  Service: Endoscopy;  Laterality: N/A;   KNEE ARTHROSCOPY Left 2008   torn meniscus    Family History  Problem Relation Age of Onset   Thyroid disease Mother    Diabetes Mellitus II Mother    Thyroid disease Father    Healthy Sister    Hypothyroidism Daughter    Hypothyroidism Son    Diabetes Mellitus I Son    Kidney cancer Maternal Grandmother    Lung cancer Maternal Grandfather        smoker   Colon cancer Neg Hx    Breast cancer Neg Hx     Social History   Socioeconomic History   Marital status: Divorced    Spouse name: Not on file   Number of children: 3   Years of education: Not on  file   Highest education level: Not on file  Occupational History   Not on file  Tobacco Use   Smoking status: Never   Smokeless tobacco: Never  Vaping Use   Vaping Use: Never used  Substance and Sexual Activity   Alcohol use: Yes    Alcohol/week: 0.0 standard drinks of alcohol    Comment: rare   Drug use: Never   Sexual activity: Not Currently    Birth control/protection: Post-menopausal   Other Topics Concern   Not on file  Social History Narrative   Not on file   Social Determinants of Health   Financial Resource Strain: Not on file  Food Insecurity: Not on file  Transportation Needs: Not on file  Physical Activity: Not on file  Stress: Not on file  Social Connections: Not on file  Intimate Partner Violence: Not on file    Current Outpatient Medications on File Prior to Visit  Medication Sig Dispense Refill   docusate sodium (COLACE) 100 MG capsule Take 100 mg by mouth 2 (two) times daily.     levothyroxine (SYNTHROID) 100 MCG tablet Take 1 tablet (100 mcg total) by mouth daily before breakfast. (Patient taking differently: Take 100 mcg by mouth at bedtime.) 90 tablet 3   Multiple Vitamin (MULTI-VITAMINS) TABS Take 1 tablet by mouth in the morning.     No current facility-administered medications on file prior to visit.    Allergies  Allergen Reactions   Aspirin Other (See Comments)   Penicillin G Other (See Comments)       Observations/Objective: There were no vitals filed for this visit. There is no height or weight on file to calculate BMI.  Physical Exam  CBC    Component Value Date/Time   WBC 5.3 03/07/2022 0801   RBC 3.36 (L) 03/07/2022 0801   HGB 9.2 (L) 03/07/2022 0801   HGB 8.7 (L) 11/26/2021 1336   HCT 28.0 (L) 03/07/2022 0801   HCT 29.4 (L) 11/26/2021 1336   PLT 289 03/07/2022 0801   PLT 331 11/26/2021 1336   MCV 83.3 03/07/2022 0801   MCV 86 11/26/2021 1336   MCH 27.4 03/07/2022 0801   MCHC 32.9 03/07/2022 0801   RDW 14.6 03/07/2022 0801   RDW 14.9 11/26/2021 1336   LYMPHSABS 1.5 03/07/2022 0801   LYMPHSABS 1.7 11/26/2021 1336   MONOABS 0.7 03/07/2022 0801   EOSABS 0.1 03/07/2022 0801   EOSABS 0.1 11/26/2021 1336   BASOSABS 0.0 03/07/2022 0801   BASOSABS 0.0 11/26/2021 1336    CMP     Component Value Date/Time   NA 138 11/26/2021 1336   K 4.3 11/26/2021 1336   CL 103 11/26/2021 1336   CO2 22 11/26/2021 1336   GLUCOSE 94  11/26/2021 1336   GLUCOSE 96 09/13/2021 0521   BUN 19 11/26/2021 1336   CREATININE 0.72 11/26/2021 1336   CALCIUM 9.3 11/26/2021 1336   PROT 7.1 11/26/2021 1336   ALBUMIN 4.3 11/26/2021 1336   AST 23 11/26/2021 1336   ALT 27 11/26/2021 1336   ALKPHOS 73 11/26/2021 1336   BILITOT 0.3 11/26/2021 1336   GFRNONAA >60 09/13/2021 0521   GFRAA 114 07/16/2019 0945     ASSESSMENT & PLAN:   IDA (iron deficiency anemia) Labs are reviewed and discussed with patient. Improved hemoglobin, still low. Iron panel is also still decreased.  Recommend IV venofer Q2 weeks x 4    Hemorrhoids She continues to have rectal bleeding,.  Refer to GI for further evaluation.  Orders Placed This Encounter  Procedures   CBC with Differential/Platelet    Standing Status:   Future    Standing Expiration Date:   03/12/2023   Iron and TIBC    Standing Status:   Future    Standing Expiration Date:   03/12/2023   Ferritin    Standing Status:   Future    Standing Expiration Date:   03/12/2023   Ambulatory referral to Gastroenterology    Referral Priority:   Routine    Referral Type:   Consultation    Referral Reason:   Specialty Services Required    Referred to Provider:   Lucilla Lame, MD    Number of Visits Requested:   1   Follow up in 3 months.  I discussed the assessment and treatment plan with the patient. The patient was provided an opportunity to ask questions and all were answered. The patient agreed with the plan and demonstrated an understanding of the instructions.  The patient was advised to call back or seek an in-person evaluation if the symptoms worsen or if the condition fails to improve as anticipated.   I provided 15 minutes of face-to-face video visit time during this encounter, and > 50% was spent counseling as documented under my assessment & plan.    Earlie Server, MD 03/11/2022 10:36 PM

## 2022-03-11 NOTE — Progress Notes (Signed)
Pt contacted for Mychart visit. No new concerns voiced.  

## 2022-03-30 ENCOUNTER — Inpatient Hospital Stay: Payer: BC Managed Care – PPO | Attending: Oncology

## 2022-03-30 VITALS — BP 129/87 | HR 109 | Temp 98.0°F | Resp 17

## 2022-03-30 DIAGNOSIS — D508 Other iron deficiency anemias: Secondary | ICD-10-CM

## 2022-03-30 DIAGNOSIS — D509 Iron deficiency anemia, unspecified: Secondary | ICD-10-CM | POA: Diagnosis present

## 2022-03-30 MED ORDER — SODIUM CHLORIDE 0.9 % IV SOLN
200.0000 mg | Freq: Once | INTRAVENOUS | Status: AC
  Administered 2022-03-30: 200 mg via INTRAVENOUS
  Filled 2022-03-30: qty 200

## 2022-03-30 MED ORDER — SODIUM CHLORIDE 0.9 % IV SOLN
Freq: Once | INTRAVENOUS | Status: AC
  Filled 2022-03-30: qty 250

## 2022-04-13 ENCOUNTER — Inpatient Hospital Stay: Payer: BC Managed Care – PPO

## 2022-04-13 VITALS — BP 118/76 | HR 92 | Temp 97.0°F | Resp 17

## 2022-04-13 DIAGNOSIS — D509 Iron deficiency anemia, unspecified: Secondary | ICD-10-CM | POA: Diagnosis not present

## 2022-04-13 DIAGNOSIS — D508 Other iron deficiency anemias: Secondary | ICD-10-CM

## 2022-04-13 MED ORDER — SODIUM CHLORIDE 0.9 % IV SOLN
INTRAVENOUS | Status: DC
  Filled 2022-04-13: qty 250

## 2022-04-13 MED ORDER — SODIUM CHLORIDE 0.9 % IV SOLN
200.0000 mg | Freq: Once | INTRAVENOUS | Status: AC
  Administered 2022-04-13: 200 mg via INTRAVENOUS
  Filled 2022-04-13: qty 200

## 2022-04-27 ENCOUNTER — Inpatient Hospital Stay: Payer: BC Managed Care – PPO | Attending: Oncology

## 2022-04-27 VITALS — BP 115/86 | HR 99 | Temp 98.7°F | Resp 18

## 2022-04-27 DIAGNOSIS — D509 Iron deficiency anemia, unspecified: Secondary | ICD-10-CM | POA: Insufficient documentation

## 2022-04-27 DIAGNOSIS — D508 Other iron deficiency anemias: Secondary | ICD-10-CM

## 2022-04-27 MED ORDER — SODIUM CHLORIDE 0.9 % IV SOLN
INTRAVENOUS | Status: DC | PRN
  Filled 2022-04-27: qty 250

## 2022-04-27 MED ORDER — SODIUM CHLORIDE 0.9 % IV SOLN
200.0000 mg | Freq: Once | INTRAVENOUS | Status: AC
  Administered 2022-04-27: 200 mg via INTRAVENOUS
  Filled 2022-04-27: qty 10

## 2022-04-27 NOTE — Progress Notes (Signed)
Patient declined to wait the 30 minutes for post iron infusion observation today.  Post iron education done. Patient verbalized understanding.

## 2022-05-11 ENCOUNTER — Inpatient Hospital Stay: Payer: BC Managed Care – PPO

## 2022-05-11 VITALS — BP 129/86 | HR 96 | Temp 98.1°F | Resp 16

## 2022-05-11 DIAGNOSIS — D509 Iron deficiency anemia, unspecified: Secondary | ICD-10-CM | POA: Diagnosis not present

## 2022-05-11 DIAGNOSIS — D508 Other iron deficiency anemias: Secondary | ICD-10-CM

## 2022-05-11 MED ORDER — SODIUM CHLORIDE 0.9 % IV SOLN
INTRAVENOUS | Status: DC
  Filled 2022-05-11 (×2): qty 250

## 2022-05-11 MED ORDER — SODIUM CHLORIDE 0.9 % IV SOLN
200.0000 mg | Freq: Once | INTRAVENOUS | Status: AC
  Administered 2022-05-11: 200 mg via INTRAVENOUS
  Filled 2022-05-11: qty 200

## 2022-05-11 NOTE — Progress Notes (Signed)
Pt tolerated treatment without concerns.  VSS.  Refused 30 minute post observation.

## 2022-05-11 NOTE — Patient Instructions (Signed)

## 2022-05-24 ENCOUNTER — Ambulatory Visit: Payer: BC Managed Care – PPO | Admitting: Family Medicine

## 2022-05-24 VITALS — BP 143/82 | HR 122 | Temp 98.4°F | Wt 158.0 lb

## 2022-05-24 DIAGNOSIS — H6502 Acute serous otitis media, left ear: Secondary | ICD-10-CM | POA: Diagnosis not present

## 2022-05-24 DIAGNOSIS — J040 Acute laryngitis: Secondary | ICD-10-CM

## 2022-05-24 MED ORDER — AZITHROMYCIN 250 MG PO TABS: ORAL_TABLET | ORAL | 0 refills | Status: AC

## 2022-05-24 NOTE — Progress Notes (Signed)
      Established patient visit   Patient: Annette Phillips   DOB: 11/04/1965   57 y.o. Female  MRN: [id number] Visit Date: 05/24/2022  Today's healthcare provider: Mila Merry, MD   No chief complaint on file.  Subjective    HPI  Patient is a 57 year old female who presents for evaluation of left ear pain, left sided sore throat and laryngitis.  Her symptoms began yesterday.  She did have slight productive cough this morning. Patient states she slept in the recliner due to increased pain in the ear when she laid down.    Medications: Outpatient Medications Prior to Visit  Medication Sig   docusate sodium (COLACE) 100 MG capsule Take 100 mg by mouth 2 (two) times daily.   levothyroxine (SYNTHROID) 100 MCG tablet Take 1 tablet (100 mcg total) by mouth daily before breakfast. (Patient taking differently: Take 100 mcg by mouth at bedtime.)   Multiple Vitamin (MULTI-VITAMINS) TABS Take 1 tablet by mouth in the morning.   No facility-administered medications prior to visit.    Review of Systems  Constitutional:  Negative for chills, diaphoresis, fatigue and fever.  HENT:  Positive for ear pain, sore throat and voice change. Negative for postnasal drip, rhinorrhea, sinus pressure and sinus pain.   Respiratory:  Positive for cough. Negative for shortness of breath.   Cardiovascular:  Negative for chest pain.       Objective    BP (!) 143/82 (BP Location: Left Arm, Patient Position: Sitting, Cuff Size: Normal)   Pulse (!) 122   Temp 98.4 F (36.9 C) (Oral)   Wt 158 lb (71.7 kg)   SpO2 100%   BMI 24.75 kg/m    Physical Exam   General Appearance:    Well developed, well nourished female, alert, cooperative, in no acute distress  HENT:   right TM normal without fluid or infection, left TM fluid noted, neck has bilateral anterior cervical nodes enlarged, throat normal without erythema or exudate, sinuses nontender, and post nasal drip noted. Voice is hoarse.   Eyes:    PERRL,  conjunctiva/corneas clear, EOM's intact       Lungs:     Clear to auscultation bilaterally, respirations unlabored  Heart:    Tachycardic. Normal rhythm. No murmurs, rubs, or gallops.    Neurologic:   Awake, alert, oriented x 3. No apparent focal neurological           defect.         Assessment & Plan     1. Non-recurrent acute serous otitis media of left ear Counseled on option of watchful waiting as this may be viral in nature. She prefers to have antibiotic prescribed  - azithromycin (ZITHROMAX) 250 MG tablet; Take 2 tablets on day 1, then 1 tablet daily on days 2 through 5  Dispense: 6 tablet; Refill: 0  2. Laryngitis Advised this is likely viral. Can take NSAIDs or expectorants. Expect resolution in 5-7 days.    Call if symptoms change or if not improving as expected       The entirety of the information documented in the History of Present Illness, Review of Systems and Physical Exam were personally obtained by me. Portions of this information were initially documented by the CMA and reviewed by me for thoroughness and accuracy.     Mila Merry, MD  [facility] [phone] (phone) [phone] (fax)  [facility]

## 2022-06-15 HISTORY — PX: DE QUERVAIN'S RELEASE: SHX1439

## 2022-06-20 ENCOUNTER — Inpatient Hospital Stay: Payer: BC Managed Care – PPO | Attending: Oncology

## 2022-06-20 DIAGNOSIS — D509 Iron deficiency anemia, unspecified: Secondary | ICD-10-CM | POA: Insufficient documentation

## 2022-06-20 DIAGNOSIS — D508 Other iron deficiency anemias: Secondary | ICD-10-CM

## 2022-06-20 DIAGNOSIS — K649 Unspecified hemorrhoids: Secondary | ICD-10-CM | POA: Diagnosis not present

## 2022-06-20 LAB — CBC WITH DIFFERENTIAL/PLATELET
Abs Immature Granulocytes: 0.01 10*3/uL (ref 0.00–0.07)
Basophils Absolute: 0 10*3/uL (ref 0.0–0.1)
Basophils Relative: 0 %
Eosinophils Absolute: 0.1 10*3/uL (ref 0.0–0.5)
Eosinophils Relative: 2 %
HCT: 28.2 % — ABNORMAL LOW (ref 36.0–46.0)
Hemoglobin: 8.3 g/dL — ABNORMAL LOW (ref 12.0–15.0)
Immature Granulocytes: 0 %
Lymphocytes Relative: 34 %
Lymphs Abs: 1.4 10*3/uL (ref 0.7–4.0)
MCH: 23.4 pg — ABNORMAL LOW (ref 26.0–34.0)
MCHC: 29.4 g/dL — ABNORMAL LOW (ref 30.0–36.0)
MCV: 79.4 fL — ABNORMAL LOW (ref 80.0–100.0)
Monocytes Absolute: 0.5 10*3/uL (ref 0.1–1.0)
Monocytes Relative: 13 %
Neutro Abs: 2.1 10*3/uL (ref 1.7–7.7)
Neutrophils Relative %: 51 %
Platelets: 275 10*3/uL (ref 150–400)
RBC: 3.55 MIL/uL — ABNORMAL LOW (ref 3.87–5.11)
RDW: 18.6 % — ABNORMAL HIGH (ref 11.5–15.5)
WBC: 4.1 10*3/uL (ref 4.0–10.5)
nRBC: 0 % (ref 0.0–0.2)

## 2022-06-20 LAB — IRON AND TIBC
Iron: 22 ug/dL — ABNORMAL LOW (ref 28–170)
Saturation Ratios: 5 % — ABNORMAL LOW (ref 10.4–31.8)
TIBC: 445 ug/dL (ref 250–450)
UIBC: 423 ug/dL

## 2022-06-20 LAB — FERRITIN: Ferritin: 8 ng/mL — ABNORMAL LOW (ref 11–307)

## 2022-06-22 ENCOUNTER — Encounter: Payer: Self-pay | Admitting: Oncology

## 2022-06-22 ENCOUNTER — Inpatient Hospital Stay (HOSPITAL_BASED_OUTPATIENT_CLINIC_OR_DEPARTMENT_OTHER): Payer: BC Managed Care – PPO | Admitting: Oncology

## 2022-06-22 DIAGNOSIS — Z801 Family history of malignant neoplasm of trachea, bronchus and lung: Secondary | ICD-10-CM

## 2022-06-22 DIAGNOSIS — Z8051 Family history of malignant neoplasm of kidney: Secondary | ICD-10-CM | POA: Diagnosis not present

## 2022-06-22 DIAGNOSIS — K648 Other hemorrhoids: Secondary | ICD-10-CM | POA: Diagnosis not present

## 2022-06-22 DIAGNOSIS — D508 Other iron deficiency anemias: Secondary | ICD-10-CM | POA: Diagnosis not present

## 2022-06-22 NOTE — Assessment & Plan Note (Signed)
Labs are reviewed and discussed with patient. Hemoglobin is worse. Iron panel is also still decreased.  Recommend IV venofer weekly x 5. Etiology of iron deficiency, suspect from rectal bleeding or other occult GI bleeding.

## 2022-06-22 NOTE — Progress Notes (Signed)
HEMATOLOGY-ONCOLOGY TeleHEALTH VISIT PROGRESS NOTE  I connected with Annette Phillips on 06/22/22  at  2:30 PM EDT by video enabled telemedicine visit and verified that I am speaking with the correct person using two identifiers. I discussed the limitations, risks, security and privacy concerns of performing an evaluation and management service by telemedicine and the availability of in-person appointments. The patient expressed understanding and agreed to proceed.   Other persons participating in the visit and their role in the encounter:  None  Patient's location: Home  Provider's location: office Chief Complaint: IDA   INTERVAL HISTORY Annette Phillips is a 56 y.o. female who has above history reviewed by me today presents for follow up visit for management of iron deficiency anemia Problems and complaints are listed below:  She tolerates previous IV Venofer treatments.  Patient reports feeling well. Patient has been completing, last episode was 2 months ago.  No recent bleeding episodes.  Denies any hematuria.  Review of Systems  Constitutional:  Positive for fatigue. Negative for appetite change, chills and fever.  HENT:   Negative for hearing loss and voice change.   Eyes:  Negative for eye problems.  Respiratory:  Negative for chest tightness and cough.   Cardiovascular:  Negative for chest pain.  Gastrointestinal:  Positive for blood in stool. Negative for abdominal distention and abdominal pain.       Hemorrhoid  Endocrine: Negative for hot flashes.  Genitourinary:  Negative for difficulty urinating and frequency.   Musculoskeletal:  Negative for arthralgias.  Skin:  Negative for itching and rash.  Neurological:  Negative for extremity weakness.  Hematological:  Negative for adenopathy.  Psychiatric/Behavioral:  Negative for confusion.     Past Medical History:  Diagnosis Date   Goiter    Hypothyroid    Prediabetes    Recurrent cold sores    Past Surgical History:   Procedure Laterality Date   APPENDECTOMY     BREAST BIOPSY Right 2015   benign   CHOLECYSTECTOMY N/A 02/28/2019   Procedure: LAPAROSCOPIC CHOLECYSTECTOMY;  Surgeon: Henrene Dodge, MD;  Location: [facility] ORS;  Service: General;  Laterality: N/A;   COLONOSCOPY WITH PROPOFOL N/A 09/14/2021   Procedure: COLONOSCOPY WITH PROPOFOL;  Surgeon: Midge Minium, MD;  Location: [facility] ENDOSCOPY;  Service: Endoscopy;  Laterality: N/A;   ESOPHAGOGASTRODUODENOSCOPY (EGD) WITH PROPOFOL N/A 02/27/2019   Procedure: ESOPHAGOGASTRODUODENOSCOPY (EGD) WITH PROPOFOL;  Surgeon: Toney Reil, MD;  Location[facility] ENDOSCOPY;  Service: Gastroenterology;  Laterality: N/A;   ESOPHAGOGASTRODUODENOSCOPY (EGD) WITH PROPOFOL N/A 09/14/2021   Procedure: ESOPHAGOGASTRODUODENOSCOPY (EGD) WITH PROPOFOL;  Surgeon: Midge Minium, MD;  Location: [facility] ENDOSCOPY;  Service: Endoscopy;  Laterality: N/A;   GIVENS CAPSULE STUDY N/A 09/15/2021   Procedure: GIVENS CAPSULE STUDY;  Surgeon: Midge Minium, MD;  Location: [facility] ENDOSCOPY;  Service: Endoscopy;  Laterality: N/A;   KNEE ARTHROSCOPY Left 2008   torn meniscus    Family History  Problem Relation Age of Onset   Thyroid disease Mother    Diabetes Mellitus II Mother    Thyroid disease Father    Healthy Sister    Hypothyroidism Daughter    Hypothyroidism Son    Diabetes Mellitus I Son    Kidney cancer Maternal Grandmother    Lung cancer Maternal Grandfather        smoker   Colon cancer Neg Hx    Breast cancer Neg Hx     Social History   Socioeconomic History   Marital status: Divorced    Spouse name: Not on file  Number of children: 3   Years of education: Not on file   Highest education level: Not on file  Occupational History   Not on file  Tobacco Use   Smoking status: Never   Smokeless tobacco: Never  Vaping Use   Vaping Use: Never used  Substance and Sexual Activity   Alcohol use: Yes    Alcohol/week: 0.0 standard drinks of alcohol    Comment: rare   Drug use:  Never   Sexual activity: Not Currently    Birth control/protection: Post-menopausal  Other Topics Concern   Not on file  Social History Narrative   Not on file   Social Determinants of Health   Financial Resource Strain: Not on file  Food Insecurity: Not on file  Transportation Needs: Not on file  Physical Activity: Not on file  Stress: Not on file  Social Connections: Not on file  Intimate Partner Violence: Not on file    Current Outpatient Medications on File Prior to Visit  Medication Sig Dispense Refill   docusate sodium (COLACE) 100 MG capsule Take 100 mg by mouth 2 (two) times daily.     levothyroxine (SYNTHROID) 100 MCG tablet Take 1 tablet (100 mcg total) by mouth daily before breakfast. (Patient taking differently: Take 100 mcg by mouth at bedtime.) 90 tablet 3   Multiple Vitamin (MULTI-VITAMINS) TABS Take 1 tablet by mouth in the morning.     No current facility-administered medications on file prior to visit.    Allergies  Allergen Reactions   Aspirin Other (See Comments)   Penicillin G Other (See Comments)       Observations/Objective: There were no vitals filed for this visit. There is no height or weight on file to calculate BMI.  Physical Exam Neurological:     Mental Status: She is alert.     CBC    Component Value Date/Time   WBC 4.1 06/20/2022 0756   RBC 3.55 (L) 06/20/2022 0756   HGB 8.3 (L) 06/20/2022 0756   HGB 8.7 (L) 11/26/2021 1336   HCT 28.2 (L) 06/20/2022 0756   HCT 29.4 (L) 11/26/2021 1336   PLT 275 06/20/2022 0756   PLT 331 11/26/2021 1336   MCV 79.4 (L) 06/20/2022 0756   MCV 86 11/26/2021 1336   MCH 23.4 (L) 06/20/2022 0756   MCHC 29.4 (L) 06/20/2022 0756   RDW 18.6 (H) 06/20/2022 0756   RDW 14.9 11/26/2021 1336   LYMPHSABS 1.4 06/20/2022 0756   LYMPHSABS 1.7 11/26/2021 1336   MONOABS 0.5 06/20/2022 0756   EOSABS 0.1 06/20/2022 0756   EOSABS 0.1 11/26/2021 1336   BASOSABS 0.0 06/20/2022 0756   BASOSABS 0.0 11/26/2021 1336     CMP     Component Value Date/Time   NA 138 11/26/2021 1336   K 4.3 11/26/2021 1336   CL 103 11/26/2021 1336   CO2 22 11/26/2021 1336   GLUCOSE 94 11/26/2021 1336   GLUCOSE 96 09/13/2021 0521   BUN 19 11/26/2021 1336   CREATININE 0.72 11/26/2021 1336   CALCIUM 9.3 11/26/2021 1336   PROT 7.1 11/26/2021 1336   ALBUMIN 4.3 11/26/2021 1336   AST 23 11/26/2021 1336   ALT 27 11/26/2021 1336   ALKPHOS 73 11/26/2021 1336   BILITOT 0.3 11/26/2021 1336   GFRNONAA >60 09/13/2021 0521   GFRAA 114 07/16/2019 0945     ASSESSMENT & PLAN:   IDA (iron deficiency anemia) Labs are reviewed and discussed with patient. Hemoglobin is worse. Iron panel is also still  decreased.  Recommend IV venofer weekly x 5. Etiology of iron deficiency, suspect from rectal bleeding or other occult GI bleeding.    Hemorrhoids She continues to have rectal bleeding,.  Last bleeding was 2 months ago. Patient prefers to get a second opinion with [facility].  Referral sent.  Orders Placed This Encounter  Procedures   CBC with Differential (Cancer Center Only)    Standing Status:   Future    Standing Expiration Date:   06/22/2023   Iron and TIBC    Standing Status:   Future    Standing Expiration Date:   06/22/2023   Ferritin    Standing Status:   Future    Standing Expiration Date:   06/22/2023   CMP (Cancer Center only)    Standing Status:   Future    Standing Expiration Date:   06/22/2023   Retic Panel    Standing Status:   Future    Standing Expiration Date:   06/22/2023   Follow up in 3 months.  I discussed the assessment and treatment plan with the patient. The patient was provided an opportunity to ask questions and all were answered. The patient agreed with the plan and demonstrated an understanding of the instructions.  The patient was advised to call back or seek an in-person evaluation if the symptoms worsen or if the condition fails to improve as anticipated.   I provided 15 minutes of face-to-face  video visit time during this encounter, and > 50% was spent counseling as documented under my assessment & plan.    Rickard Patience, MD 06/22/2022 4:55 PM

## 2022-06-22 NOTE — Assessment & Plan Note (Signed)
She continues to have rectal bleeding,.  Last bleeding was 2 months ago. Patient prefers to get a second opinion with [facility].  Referral sent.

## 2022-06-28 ENCOUNTER — Other Ambulatory Visit: Payer: Self-pay | Admitting: Family Medicine

## 2022-06-28 DIAGNOSIS — E039 Hypothyroidism, unspecified: Secondary | ICD-10-CM

## 2022-06-28 NOTE — Telephone Encounter (Signed)
Requested Prescriptions  Pending Prescriptions Disp Refills   SYNTHROID 100 MCG tablet [Pharmacy Med Name: SYNTHROID 100 MCG TABLET] 90 tablet 3    Sig: TAKE 1 TABLET BY MOUTH DAILY BEFORE BREAKFAST.     Endocrinology:  Hypothyroid Agents Failed - 06/28/2022  5:34 PM      Failed - TSH in normal range and within 360 days    TSH  Date Value Ref Range Status  09/12/2021 0.146 (L) 0.350 - 4.500 uIU/mL Final    Comment:    Performed by a 3rd Generation assay with a functional sensitivity of <=0.01 uIU/mL. Performed at [facility], [address] [place], [place] [address]   07/22/2021 1.670 0.450 - 4.500 uIU/mL Final         Passed - Valid encounter within last 12 months    Recent Outpatient Visits           1 month ago Non-recurrent acute serous otitis media of left ear   [facility] Malva Limes, MD   4 months ago Chronic pain of right wrist   [facility] Merita Norton T, FNP   7 months ago RUQ abdominal pain   [facility] Alfredia Ferguson, PA-C   11 months ago Annual physical exam   [facility] [place], Marzella Schlein, MD   1 year ago Annual physical exam   [facility] [place], Marzella Schlein, MD       Future Appointments             In 1 month Bacigalupo, Marzella Schlein, MD [facility]

## 2022-06-29 ENCOUNTER — Inpatient Hospital Stay: Payer: BC Managed Care – PPO

## 2022-06-29 VITALS — BP 137/83 | HR 97 | Temp 97.6°F

## 2022-06-29 DIAGNOSIS — D508 Other iron deficiency anemias: Secondary | ICD-10-CM

## 2022-06-29 DIAGNOSIS — D509 Iron deficiency anemia, unspecified: Secondary | ICD-10-CM | POA: Diagnosis not present

## 2022-06-29 MED ORDER — SODIUM CHLORIDE 0.9 % IV SOLN
200.0000 mg | Freq: Once | INTRAVENOUS | Status: AC
  Administered 2022-06-29: 200 mg via INTRAVENOUS
  Filled 2022-06-29: qty 200

## 2022-06-29 MED ORDER — SODIUM CHLORIDE 0.9 % IV SOLN
Freq: Once | INTRAVENOUS | Status: AC
  Filled 2022-06-29: qty 250

## 2022-07-06 ENCOUNTER — Inpatient Hospital Stay: Payer: BC Managed Care – PPO

## 2022-07-06 DIAGNOSIS — D508 Other iron deficiency anemias: Secondary | ICD-10-CM

## 2022-07-06 DIAGNOSIS — D509 Iron deficiency anemia, unspecified: Secondary | ICD-10-CM | POA: Diagnosis not present

## 2022-07-06 MED ORDER — SODIUM CHLORIDE 0.9 % IV SOLN
200.0000 mg | Freq: Once | INTRAVENOUS | Status: AC
  Administered 2022-07-06: 200 mg via INTRAVENOUS
  Filled 2022-07-06: qty 200

## 2022-07-06 MED ORDER — SODIUM CHLORIDE 0.9 % IV SOLN
INTRAVENOUS | Status: DC
  Filled 2022-07-06: qty 250

## 2022-07-13 ENCOUNTER — Telehealth: Payer: Self-pay | Admitting: Family Medicine

## 2022-07-13 ENCOUNTER — Inpatient Hospital Stay: Payer: BC Managed Care – PPO

## 2022-07-13 VITALS — BP 120/81 | HR 97 | Temp 96.5°F | Resp 16

## 2022-07-13 DIAGNOSIS — Z833 Family history of diabetes mellitus: Secondary | ICD-10-CM

## 2022-07-13 DIAGNOSIS — D509 Iron deficiency anemia, unspecified: Secondary | ICD-10-CM | POA: Diagnosis not present

## 2022-07-13 DIAGNOSIS — R748 Abnormal levels of other serum enzymes: Secondary | ICD-10-CM

## 2022-07-13 DIAGNOSIS — Z13220 Encounter for screening for lipoid disorders: Secondary | ICD-10-CM

## 2022-07-13 DIAGNOSIS — D508 Other iron deficiency anemias: Secondary | ICD-10-CM

## 2022-07-13 DIAGNOSIS — E039 Hypothyroidism, unspecified: Secondary | ICD-10-CM

## 2022-07-13 MED ORDER — SODIUM CHLORIDE 0.9 % IV SOLN
200.0000 mg | Freq: Once | INTRAVENOUS | Status: AC
  Administered 2022-07-13: 200 mg via INTRAVENOUS
  Filled 2022-07-13: qty 200

## 2022-07-13 MED ORDER — SODIUM CHLORIDE 0.9 % IV SOLN
INTRAVENOUS | Status: DC
  Filled 2022-07-13: qty 250

## 2022-07-13 NOTE — Progress Notes (Signed)
Declined 30 minute post-observation. Vitals stable at discharge.  

## 2022-07-13 NOTE — Patient Instructions (Signed)

## 2022-07-13 NOTE — Telephone Encounter (Signed)
This pt came in and was asking if you would like her to do her blood work before her appt with you.  I told her we typically do it after she is seen and she said you usually like it done before.  Let pt know if you would like her to have blood  work done before her appt.

## 2022-07-14 NOTE — Telephone Encounter (Signed)
I get them before the visit if patient requests.  Ok to order CMP, lipid, TSH, A1c - use diagnoses fam hx of diabetes, hypothyroid, elevated liver function from her problem list and screening for lipid disorder. Recommend that she get them at least 2 days before her appt with me. Get them fasting. Thanks!

## 2022-07-14 NOTE — Telephone Encounter (Signed)
Patient advised to come in for lab. 

## 2022-07-15 NOTE — Progress Notes (Unsigned)
I,Brocha Gilliam S Talya Quain,acting as a Neurosurgeon for Shirlee Latch, MD.,have documented all relevant documentation on the behalf of Shirlee Latch, MD,as directed by  Shirlee Latch, MD while in the presence of Shirlee Latch, MD.    Complete physical exam   Patient: Annette Phillips   DOB: Aug 23, 1965   57 y.o. Female  MRN: [id number] Visit Date: 07/18/2022  Today's healthcare provider: Shirlee Latch, MD   No chief complaint on file.  Subjective    Annette Phillips is a 57 y.o. female who presents today for a complete physical exam.  She reports consuming a {diet types:17450} diet. {Exercise:19826} She generally feels {well/fairly well/poorly:18703}. She reports sleeping {well/fairly well/poorly:18703}. She {does/does not:200015} have additional problems to discuss today.  HPI    Past Medical History:  Diagnosis Date   Goiter    Hypothyroid    Prediabetes    Recurrent cold sores    Past Surgical History:  Procedure Laterality Date   APPENDECTOMY     BREAST BIOPSY Right 2015   benign   CHOLECYSTECTOMY N/A 02/28/2019   Procedure: LAPAROSCOPIC CHOLECYSTECTOMY;  Surgeon: Henrene Dodge, MD;  Location: [facility] ORS;  Service: General;  Laterality: N/A;   COLONOSCOPY WITH PROPOFOL N/A 09/14/2021   Procedure: COLONOSCOPY WITH PROPOFOL;  Surgeon: Midge Minium, MD;  Location: [facility] ENDOSCOPY;  Service: Endoscopy;  Laterality: N/A;   ESOPHAGOGASTRODUODENOSCOPY (EGD) WITH PROPOFOL N/A 02/27/2019   Procedure: ESOPHAGOGASTRODUODENOSCOPY (EGD) WITH PROPOFOL;  Surgeon: Toney Reil, MD;  Location: [facility] ENDOSCOPY;  Service: Gastroenterology;  Laterality: N/A;   ESOPHAGOGASTRODUODENOSCOPY (EGD) WITH PROPOFOL N/A 09/14/2021   Procedure: ESOPHAGOGASTRODUODENOSCOPY (EGD) WITH PROPOFOL;  Surgeon: Midge Minium, MD;  Location: [facility] ENDOSCOPY;  Service: Endoscopy;  Laterality: N/A;   GIVENS CAPSULE STUDY N/A 09/15/2021   Procedure: GIVENS CAPSULE STUDY;  Surgeon: Midge Minium, MD;  Location: [facility]  ENDOSCOPY;  Service: Endoscopy;  Laterality: N/A;   KNEE ARTHROSCOPY Left 2008   torn meniscus   Social History   Socioeconomic History   Marital status: Divorced    Spouse name: Not on file   Number of children: 3   Years of education: Not on file   Highest education level: Not on file  Occupational History   Not on file  Tobacco Use   Smoking status: Never   Smokeless tobacco: Never  Vaping Use   Vaping Use: Never used  Substance and Sexual Activity   Alcohol use: Yes    Alcohol/week: 0.0 standard drinks of alcohol    Comment: rare   Drug use: Never   Sexual activity: Not Currently    Birth control/protection: Post-menopausal  Other Topics Concern   Not on file  Social History Narrative   Not on file   Social Determinants of Health   Financial Resource Strain: Not on file  Food Insecurity: Not on file  Transportation Needs: Not on file  Physical Activity: Not on file  Stress: Not on file  Social Connections: Not on file  Intimate Partner Violence: Not on file   Family Status  Relation Name Status   Mother  Deceased at age 66   Father  Alive   Sister  Alive   Daughter  Alive   Son  Alive   Son  Alive   MGM  (Not Specified)   MGF  (Not Specified)   Neg Hx  (Not Specified)   Family History  Problem Relation Age of Onset   Thyroid disease Mother    Diabetes Mellitus II Mother    Thyroid disease Father  Healthy Sister    Hypothyroidism Daughter    Hypothyroidism Son    Diabetes Mellitus I Son    Kidney cancer Maternal Grandmother    Lung cancer Maternal Grandfather        smoker   Colon cancer Neg Hx    Breast cancer Neg Hx    Allergies  Allergen Reactions   Aspirin Other (See Comments)   Penicillin G Other (See Comments)    Patient Care Team: Erasmo Downer, MD as PCP - General (Family Medicine)   Medications: Outpatient Medications Prior to Visit  Medication Sig   docusate sodium (COLACE) 100 MG capsule Take 100 mg by mouth 2 (two)  times daily.   levothyroxine (SYNTHROID) 100 MCG tablet TAKE 1 TABLET BY MOUTH DAILY BEFORE BREAKFAST.   Multiple Vitamin (MULTI-VITAMINS) TABS Take 1 tablet by mouth in the morning.   No facility-administered medications prior to visit.    Review of Systems  All other systems reviewed and are negative.   Last CBC Lab Results  Component Value Date   WBC 4.1 06/20/2022   HGB 8.3 (L) 06/20/2022   HCT 28.2 (L) 06/20/2022   MCV 79.4 (L) 06/20/2022   MCH 23.4 (L) 06/20/2022   RDW 18.6 (H) 06/20/2022   PLT 275 06/20/2022   Last metabolic panel Lab Results  Component Value Date   GLUCOSE 94 11/26/2021   NA 138 11/26/2021   K 4.3 11/26/2021   CL 103 11/26/2021   CO2 22 11/26/2021   BUN 19 11/26/2021   CREATININE 0.72 11/26/2021   EGFR 98 11/26/2021   CALCIUM 9.3 11/26/2021   PROT 7.1 11/26/2021   ALBUMIN 4.3 11/26/2021   LABGLOB 2.8 11/26/2021   AGRATIO 1.5 11/26/2021   BILITOT 0.3 11/26/2021   ALKPHOS 73 11/26/2021   AST 23 11/26/2021   ALT 27 11/26/2021   ANIONGAP 6 09/13/2021   Last lipids Lab Results  Component Value Date   CHOL 200 (H) 07/22/2021   HDL 43 07/22/2021   LDLCALC 131 (H) 07/22/2021   TRIG 147 07/22/2021   CHOLHDL 4.0 07/16/2019   Last hemoglobin A1c Lab Results  Component Value Date   HGBA1C 5.9 (H) 07/22/2021   Last thyroid functions Lab Results  Component Value Date   TSH 0.146 (L) 09/12/2021   Last vitamin D No results found for: "25OHVITD2", "25OHVITD3", "VD25OH" Last vitamin B12 and Folate Lab Results  Component Value Date   VITAMINB12 784 11/26/2021   FOLATE >20.0 11/26/2021      Objective    There were no vitals taken for this visit. BP Readings from Last 3 Encounters:  07/13/22 120/81  06/29/22 137/83  05/24/22 (!) 143/82   Wt Readings from Last 3 Encounters:  05/24/22 158 lb (71.7 kg)  02/21/22 161 lb 8 oz (73.3 kg)  12/11/21 160 lb (72.6 kg)       Physical Exam  ***  Last depression screening scores     05/24/2022    9:41 AM 02/21/2022    8:13 AM 11/26/2021    1:16 PM  PHQ 2/9 Scores  PHQ - 2 Score 0 0 0  PHQ- 9 Score 0 1 0   Last fall risk screening    05/24/2022    9:41 AM  Fall Risk   Falls in the past year? 0  Number falls in past yr: 0  Injury with Fall? 0   Last Audit-C alcohol use screening    05/24/2022    9:41 AM  Alcohol Use Disorder  Test (AUDIT)  1. How often do you have a drink containing alcohol? 0  2. How many drinks containing alcohol do you have on a typical day when you are drinking? 0  3. How often do you have six or more drinks on one occasion? 0  AUDIT-C Score 0   A score of 3 or more in women, and 4 or more in men indicates increased risk for alcohol abuse, EXCEPT if all of the points are from question 1   No results found for any visits on 07/18/22.  Assessment & Plan    Routine Health Maintenance and Physical Exam  Exercise Activities and Dietary recommendations  Goals   None     Immunization History  Administered Date(s) Administered   Td 01/30/2019    Health Maintenance  Topic Date Due   COVID-19 Vaccine (1) Never done   Zoster Vaccines- Shingrix (1 of 2) Never done   INFLUENZA VACCINE  09/15/2022   MAMMOGRAM  09/07/2023   PAP SMEAR-Modifier  07/22/2024   DTaP/Tdap/Td (2 - Tdap) 01/29/2029   Colonoscopy  09/15/2031   Hepatitis C Screening  Completed   HIV Screening  Completed   HPV VACCINES  Aged Out    Discussed health benefits of physical activity, and encouraged her to engage in regular exercise appropriate for her age and condition.  ***  No follow-ups on file.     {provider attestation***:1}   Shirlee Latch, MD  [facility] [phone] (phone) [phone] (fax)  [facility]

## 2022-07-16 LAB — LIPID PANEL WITH LDL/HDL RATIO
Cholesterol, Total: 163 mg/dL (ref 100–199)
HDL: 41 mg/dL (ref >39)
LDL Chol Calc (NIH): 98 mg/dL (ref 0–99)
LDL/HDL Ratio: 2.4 ratio (ref 0.0–3.2)
Triglycerides: 137 mg/dL (ref 0–149)
VLDL Cholesterol Cal: 24 mg/dL (ref 5–40)

## 2022-07-16 LAB — COMPREHENSIVE METABOLIC PANEL
ALT: 23 IU/L (ref 0–32)
AST: 21 IU/L (ref 0–40)
Albumin/Globulin Ratio: 1.6 (ref 1.2–2.2)
Albumin: 4.5 g/dL (ref 3.8–4.9)
Alkaline Phosphatase: 66 IU/L (ref 44–121)
BUN/Creatinine Ratio: 26 — ABNORMAL HIGH (ref 9–23)
BUN: 20 mg/dL (ref 6–24)
Bilirubin Total: 0.3 mg/dL (ref 0.0–1.2)
CO2: 21 mmol/L (ref 20–29)
Calcium: 9.6 mg/dL (ref 8.7–10.2)
Chloride: 104 mmol/L (ref 96–106)
Creatinine, Ser: 0.77 mg/dL (ref 0.57–1.00)
Globulin, Total: 2.9 g/dL (ref 1.5–4.5)
Glucose: 97 mg/dL (ref 70–99)
Potassium: 4.3 mmol/L (ref 3.5–5.2)
Sodium: 139 mmol/L (ref 134–144)
Total Protein: 7.4 g/dL (ref 6.0–8.5)
eGFR: 90 mL/min/{1.73_m2} (ref >59)

## 2022-07-16 LAB — HEMOGLOBIN A1C
Est. average glucose Bld gHb Est-mCnc: 105 mg/dL
Hgb A1c MFr Bld: 5.3 % (ref 4.8–5.6)

## 2022-07-16 LAB — TSH: TSH: 0.318 u[IU]/mL — ABNORMAL LOW (ref 0.450–4.500)

## 2022-07-18 ENCOUNTER — Ambulatory Visit (INDEPENDENT_AMBULATORY_CARE_PROVIDER_SITE_OTHER): Payer: BC Managed Care – PPO | Admitting: Family Medicine

## 2022-07-18 ENCOUNTER — Encounter: Payer: Self-pay | Admitting: Family Medicine

## 2022-07-18 VITALS — BP 118/84 | HR 114 | Temp 97.6°F | Resp 12 | Ht 67.0 in | Wt 156.0 lb

## 2022-07-18 DIAGNOSIS — Z0000 Encounter for general adult medical examination without abnormal findings: Secondary | ICD-10-CM | POA: Diagnosis not present

## 2022-07-18 DIAGNOSIS — E039 Hypothyroidism, unspecified: Secondary | ICD-10-CM | POA: Diagnosis not present

## 2022-07-18 DIAGNOSIS — Z1231 Encounter for screening mammogram for malignant neoplasm of breast: Secondary | ICD-10-CM

## 2022-07-18 MED ORDER — LEVOTHYROXINE SODIUM 100 MCG PO TABS: 100.0000 ug | ORAL_TABLET | Freq: Every day | ORAL | 3 refills | Status: DC

## 2022-07-18 NOTE — Assessment & Plan Note (Signed)
Low TSH indicating possible over-replacement with Synthroid. Patient previously felt unwell with dose reduction to 88 mcg daily. No current symptoms of hyperthyroidism. -Continue Synthroid daily. -Monitor for symptoms of hyperthyroidism (diarrhea, palpitations, insomnia, weight loss, hair loss).

## 2022-07-20 ENCOUNTER — Inpatient Hospital Stay: Payer: BC Managed Care – PPO | Attending: Oncology

## 2022-07-20 VITALS — BP 125/79 | HR 76 | Temp 97.9°F | Resp 16

## 2022-07-20 DIAGNOSIS — D509 Iron deficiency anemia, unspecified: Secondary | ICD-10-CM | POA: Diagnosis present

## 2022-07-20 DIAGNOSIS — D508 Other iron deficiency anemias: Secondary | ICD-10-CM

## 2022-07-20 MED ORDER — SODIUM CHLORIDE 0.9 % IV SOLN
200.0000 mg | Freq: Once | INTRAVENOUS | Status: AC
  Administered 2022-07-20: 200 mg via INTRAVENOUS
  Filled 2022-07-20: qty 200

## 2022-07-20 MED ORDER — SODIUM CHLORIDE 0.9 % IV SOLN
INTRAVENOUS | Status: DC
  Filled 2022-07-20: qty 250

## 2022-07-20 NOTE — Progress Notes (Signed)
Patient declined 30 minute post observation. Patient educated and verbalized understanding. VSS at discharge

## 2022-07-21 ENCOUNTER — Other Ambulatory Visit: Payer: Self-pay

## 2022-07-21 DIAGNOSIS — D508 Other iron deficiency anemias: Secondary | ICD-10-CM

## 2022-07-27 ENCOUNTER — Inpatient Hospital Stay: Payer: BC Managed Care – PPO

## 2022-07-27 DIAGNOSIS — D508 Other iron deficiency anemias: Secondary | ICD-10-CM

## 2022-07-27 DIAGNOSIS — D509 Iron deficiency anemia, unspecified: Secondary | ICD-10-CM | POA: Diagnosis not present

## 2022-07-27 MED ORDER — SODIUM CHLORIDE 0.9 % IV SOLN
200.0000 mg | Freq: Once | INTRAVENOUS | Status: AC
  Administered 2022-07-27: 200 mg via INTRAVENOUS
  Filled 2022-07-27: qty 200

## 2022-07-27 MED ORDER — SODIUM CHLORIDE 0.9 % IV SOLN
INTRAVENOUS | Status: DC
  Filled 2022-07-27: qty 250

## 2022-07-27 NOTE — Patient Instructions (Signed)

## 2022-07-28 ENCOUNTER — Encounter: Payer: BC Managed Care – PPO | Admitting: Family Medicine

## 2022-09-15 HISTORY — PX: VITRECTOMY: SHX106

## 2022-09-19 ENCOUNTER — Inpatient Hospital Stay: Payer: BC Managed Care – PPO | Attending: Oncology

## 2022-09-19 DIAGNOSIS — D500 Iron deficiency anemia secondary to blood loss (chronic): Secondary | ICD-10-CM | POA: Insufficient documentation

## 2022-09-19 DIAGNOSIS — K642 Third degree hemorrhoids: Secondary | ICD-10-CM | POA: Insufficient documentation

## 2022-09-19 DIAGNOSIS — D508 Other iron deficiency anemias: Secondary | ICD-10-CM

## 2022-09-19 LAB — RETIC PANEL
Immature Retic Fract: 21.9 % — ABNORMAL HIGH (ref 2.3–15.9)
RBC.: 3.45 MIL/uL — ABNORMAL LOW (ref 3.87–5.11)
Retic Count, Absolute: 115.6 10*3/uL (ref 19.0–186.0)
Retic Ct Pct: 3.4 % — ABNORMAL HIGH (ref 0.4–3.1)
Reticulocyte Hemoglobin: 25.4 pg — ABNORMAL LOW (ref >27.9)

## 2022-09-19 LAB — CMP (CANCER CENTER ONLY)
ALT: 22 U/L (ref 0–44)
AST: 19 U/L (ref 15–41)
Albumin: 3.9 g/dL (ref 3.5–5.0)
Alkaline Phosphatase: 50 U/L (ref 38–126)
Anion gap: 4 — ABNORMAL LOW (ref 5–15)
BUN: 24 mg/dL — ABNORMAL HIGH (ref 6–20)
CO2: 25 mmol/L (ref 22–32)
Calcium: 8.8 mg/dL — ABNORMAL LOW (ref 8.9–10.3)
Chloride: 105 mmol/L (ref 98–111)
Creatinine: 0.65 mg/dL (ref 0.44–1.00)
GFR, Estimated: >60 mL/min (ref >60)
Glucose, Bld: 107 mg/dL — ABNORMAL HIGH (ref 70–99)
Potassium: 4.3 mmol/L (ref 3.5–5.1)
Sodium: 134 mmol/L — ABNORMAL LOW (ref 135–145)
Total Bilirubin: 0.2 mg/dL — ABNORMAL LOW (ref 0.3–1.2)
Total Protein: 7.1 g/dL (ref 6.5–8.1)

## 2022-09-19 LAB — CBC WITH DIFFERENTIAL (CANCER CENTER ONLY)
Abs Immature Granulocytes: 0.04 10*3/uL (ref 0.00–0.07)
Basophils Absolute: 0 10*3/uL (ref 0.0–0.1)
Basophils Relative: 0 %
Eosinophils Absolute: 0.1 10*3/uL (ref 0.0–0.5)
Eosinophils Relative: 2 %
HCT: 30.4 % — ABNORMAL LOW (ref 36.0–46.0)
Hemoglobin: 9.2 g/dL — ABNORMAL LOW (ref 12.0–15.0)
Immature Granulocytes: 1 %
Lymphocytes Relative: 25 %
Lymphs Abs: 1.1 10*3/uL (ref 0.7–4.0)
MCH: 26.6 pg (ref 26.0–34.0)
MCHC: 30.3 g/dL (ref 30.0–36.0)
MCV: 87.9 fL (ref 80.0–100.0)
Monocytes Absolute: 0.5 10*3/uL (ref 0.1–1.0)
Monocytes Relative: 12 %
Neutro Abs: 2.6 10*3/uL (ref 1.7–7.7)
Neutrophils Relative %: 60 %
Platelet Count: 263 10*3/uL (ref 150–400)
RBC: 3.46 MIL/uL — ABNORMAL LOW (ref 3.87–5.11)
RDW: 18.6 % — ABNORMAL HIGH (ref 11.5–15.5)
WBC Count: 4.3 10*3/uL (ref 4.0–10.5)
nRBC: 0 % (ref 0.0–0.2)

## 2022-09-19 LAB — IRON AND TIBC
Iron: 105 ug/dL (ref 28–170)
Saturation Ratios: 24 % (ref 10.4–31.8)
TIBC: 435 ug/dL (ref 250–450)
UIBC: 330 ug/dL

## 2022-09-19 LAB — FERRITIN: Ferritin: 14 ng/mL (ref 11–307)

## 2022-09-21 ENCOUNTER — Inpatient Hospital Stay (HOSPITAL_BASED_OUTPATIENT_CLINIC_OR_DEPARTMENT_OTHER): Payer: BC Managed Care – PPO | Admitting: Oncology

## 2022-09-21 ENCOUNTER — Inpatient Hospital Stay: Payer: BC Managed Care – PPO

## 2022-09-21 ENCOUNTER — Encounter: Payer: Self-pay | Admitting: Oncology

## 2022-09-21 VITALS — BP 124/87 | HR 94 | Temp 97.4°F | Resp 18 | Wt 159.4 lb

## 2022-09-21 VITALS — BP 124/75 | HR 83 | Temp 98.1°F | Resp 18

## 2022-09-21 DIAGNOSIS — D500 Iron deficiency anemia secondary to blood loss (chronic): Secondary | ICD-10-CM | POA: Diagnosis not present

## 2022-09-21 DIAGNOSIS — K648 Other hemorrhoids: Secondary | ICD-10-CM

## 2022-09-21 DIAGNOSIS — D508 Other iron deficiency anemias: Secondary | ICD-10-CM | POA: Diagnosis not present

## 2022-09-21 MED ORDER — SODIUM CHLORIDE 0.9 % IV SOLN
INTRAVENOUS | Status: DC
  Filled 2022-09-21: qty 250

## 2022-09-21 MED ORDER — SODIUM CHLORIDE 0.9 % IV SOLN
200.0000 mg | Freq: Once | INTRAVENOUS | Status: AC
  Administered 2022-09-21: 200 mg via INTRAVENOUS
  Filled 2022-09-21: qty 200

## 2022-09-21 NOTE — Progress Notes (Signed)
Pt has been educated and understands. Pt declined to stay 30 mins after iron infusion. VSS.

## 2022-09-21 NOTE — Assessment & Plan Note (Addendum)
Labs are reviewed and discussed with patient. Lab Results  Component Value Date   HGB 9.2 (L) 09/19/2022   TIBC 435 09/19/2022   IRONPCTSAT 24 09/19/2022   FERRITIN 14 09/19/2022    Hemoglobin has improved, still low.  Recommend IV venofer weekly x 5.

## 2022-09-21 NOTE — Progress Notes (Signed)
Pt here for follow up. Reports she still has bleeding with bowel movements. She has appt with GI on 8/19

## 2022-09-21 NOTE — Assessment & Plan Note (Signed)
She continues to have rectal bleeding Etiology of iron deficiency, suspect from rectal bleeding or other occult GI bleeding.she has upcoming GI appointment at [facility]

## 2022-09-21 NOTE — Progress Notes (Signed)
Hematology/Oncology Consult note [phone] [phone]      Patient Care Team: Erasmo Downer, MD as PCP - General (Family Medicine)   REFERRING PROVIDER: Erasmo Downer, MD  CHIEF COMPLAINTS/REASON FOR VISIT:  Iron deficiency anemia.   ASSESSMENT & PLAN:   IDA (iron deficiency anemia) Labs are reviewed and discussed with patient. Lab Results  Component Value Date   HGB 9.2 (L) 09/19/2022   TIBC 435 09/19/2022   IRONPCTSAT 24 09/19/2022   FERRITIN 14 09/19/2022    Hemoglobin has improved, still low.  Recommend IV venofer weekly x 5.     Hemorrhoids She continues to have rectal bleeding Etiology of iron deficiency, suspect from rectal bleeding or other occult GI bleeding.she has upcoming GI appointment at [facility]    No orders of the defined types were placed in this encounter.  3 months. Lab Virtual MD  All questions were answered. The patient knows to call the clinic with any problems, questions or concerns.  Rickard Patience, MD, PhD [facility] Hematology Oncology 09/21/2022     HISTORY OF PRESENTING ILLNESS:  Annette Phillips is a  57 y.o.  female with PMH listed below who was referred to me for anemia Reviewed patient's recent labs that was done.  She was found to have abnormal CBC on 11/26/21 with Hb 8.7, mcv 86, Reviewed patient's previous labs ordered by primary care physician's office, anemia is chronic onset , duration is since 2021.  09/12/21, hospitalized due to near syncope, SON, Hb 5.4, sp blood transfusion and IV venofer . She tolerates well with no side effects.   09/14/21 EGD: Small hiatal hernia, nonobstructing Schatzki ring, stomach and duodenum normal.  Colonoscopy: Bleeding hemorrhoids grade 3.  10/08/21 capsule study negative.   + fatigue. + right side abdomen discomfort, intermittently. Recent US showed no acute finding. Fatty liver disease. History of cholecystectomy.  She denies recent chest pain on exertion,  pre-syncopal episodes, or palpitations She had not noticed any recent bleeding such as epistaxis, hematuria or hematochezia. She is postmenopausal.  She denies over the counter NSAID ingestion.  She denies any pica and eats a variety of diet. Denies occupational toxin expose. She works as Careers adviser  INTERVAL HISTORY Annette Phillips is a 57 y.o. female who has above history reviewed by me today presents for follow up visit for iron deficiency anemia.  She tolerates IV Venofer treatments.  Continues to have rectal bleeding.   MEDICAL HISTORY:  Past Medical History:  Diagnosis Date   Allergy    Penicillin, Aspirin   Anemia July 2023   Blood transfusion without reported diagnosis July 2023   Goiter    Hypothyroid    Prediabetes    Recurrent cold sores     SURGICAL HISTORY: Past Surgical History:  Procedure Laterality Date   APPENDECTOMY     BREAST BIOPSY Right 2015   benign   CHOLECYSTECTOMY N/A 02/28/2019   Procedure: LAPAROSCOPIC CHOLECYSTECTOMY;  Surgeon: Henrene Dodge, MD;  Location: [facility] ORS;  Service: General;  Laterality: N/A;   COLONOSCOPY WITH PROPOFOL N/A 09/14/2021   Procedure: COLONOSCOPY WITH PROPOFOL;  Surgeon: Midge Minium, MD;  Location: [facility] ENDOSCOPY;  Service: Endoscopy;  Laterality: N/A;   ESOPHAGOGASTRODUODENOSCOPY (EGD) WITH PROPOFOL N/A 02/27/2019   Procedure: ESOPHAGOGASTRODUODENOSCOPY (EGD) WITH PROPOFOL;  Surgeon: Toney Reil, MD;  Location: [facility] ENDOSCOPY;  Service: Gastroenterology;  Laterality: N/A;   ESOPHAGOGASTRODUODENOSCOPY (EGD) WITH PROPOFOL N/A 09/14/2021   Procedure: ESOPHAGOGASTRODUODENOSCOPY (EGD) WITH PROPOFOL;  Surgeon: Midge Minium,  MD;  Location: [facility] ENDOSCOPY;  Service: Endoscopy;  Laterality: N/A;   GIVENS CAPSULE STUDY N/A 09/15/2021   Procedure: GIVENS CAPSULE STUDY;  Surgeon: Midge Minium, MD;  Location: [facility] ENDOSCOPY;  Service: Endoscopy;  Laterality: N/A;   KNEE ARTHROSCOPY Left 2008   torn  meniscus    SOCIAL HISTORY: Social History   Socioeconomic History   Marital status: Divorced    Spouse name: Not on file   Number of children: 3   Years of education: Not on file   Highest education level: Not on file  Occupational History   Not on file  Tobacco Use   Smoking status: Never   Smokeless tobacco: Never  Vaping Use   Vaping status: Never Used  Substance and Sexual Activity   Alcohol use: Not Currently    Comment: rare   Drug use: Never   Sexual activity: Not Currently    Birth control/protection: Post-menopausal  Other Topics Concern   Not on file  Social History Narrative   Not on file   Social Determinants of Health   Financial Resource Strain: Not on file  Food Insecurity: Not on file  Transportation Needs: Not on file  Physical Activity: Not on file  Stress: Not on file  Social Connections: Not on file  Intimate Partner Violence: Not on file    FAMILY HISTORY: Family History  Problem Relation Age of Onset   Thyroid disease Mother    Diabetes Mellitus II Mother    Diabetes Mother    Thyroid disease Father    Healthy Sister    Hypothyroidism Daughter    Diabetes Son    Hypothyroidism Son    Diabetes Mellitus I Son    Kidney cancer Maternal Grandmother    Lung cancer Maternal Grandfather        smoker   Colon cancer Neg Hx    Breast cancer Neg Hx     ALLERGIES:  is allergic to aspirin and penicillin g.  MEDICATIONS:  Current Outpatient Medications  Medication Sig Dispense Refill   docusate sodium (COLACE) 100 MG capsule Take 100 mg by mouth 2 (two) times daily.     levothyroxine (SYNTHROID) 100 MCG tablet Take 1 tablet (100 mcg total) by mouth daily before breakfast. 90 tablet 3   Multiple Vitamin (MULTI-VITAMINS) TABS Take 1 tablet by mouth in the morning.     No current facility-administered medications for this visit.   Facility-Administered Medications Ordered in Other Visits  Medication Dose Route Frequency Provider Last Rate  Last Admin   0.9 %  sodium chloride infusion   Intravenous Continuous Rickard Patience, MD   Stopped at 09/21/22 1541    Review of Systems  Constitutional:  Positive for fatigue. Negative for appetite change, chills and fever.  HENT:   Negative for hearing loss and voice change.   Eyes:  Negative for eye problems.  Respiratory:  Negative for chest tightness and cough.   Cardiovascular:  Negative for chest pain.  Gastrointestinal:  Positive for blood in stool. Negative for abdominal distention and abdominal pain.       Right abdomen discomfort   Endocrine: Negative for hot flashes.  Genitourinary:  Negative for difficulty urinating and frequency.   Musculoskeletal:  Negative for arthralgias.  Skin:  Negative for itching and rash.  Neurological:  Negative for extremity weakness.  Hematological:  Negative for adenopathy.  Psychiatric/Behavioral:  Negative for confusion.     PHYSICAL EXAMINATION: ECOG PERFORMANCE STATUS: 1 - Symptomatic but completely ambulatory Vitals:  09/21/22 1432  BP: 124/87  Pulse: 94  Resp: 18  Temp: (!) 97.4 F (36.3 C)   Filed Weights   09/21/22 1432  Weight: 159 lb 6.4 oz (72.3 kg)    Physical Exam Constitutional:      General: She is not in acute distress. HENT:     Head: Normocephalic and atraumatic.  Eyes:     General: No scleral icterus. Cardiovascular:     Rate and Rhythm: Normal rate and regular rhythm.  Pulmonary:     Effort: Pulmonary effort is normal. No respiratory distress.     Breath sounds: No wheezing.  Abdominal:     General: Bowel sounds are normal. There is no distension.     Palpations: Abdomen is soft.  Musculoskeletal:        General: Normal range of motion.     Cervical back: Normal range of motion and neck supple.  Skin:    General: Skin is warm and dry.     Findings: No erythema or rash.  Neurological:     Mental Status: She is alert and oriented to person, place, and time. Mental status is at baseline.     Cranial  Nerves: No cranial nerve deficit.  Psychiatric:        Mood and Affect: Mood normal.      LABORATORY DATA:  I have reviewed the data as listed    Latest Ref Rng & Units 09/19/2022    8:11 AM 06/20/2022    7:56 AM 03/07/2022    8:01 AM  CBC  WBC 4.0 - 10.5 K/uL 4.3  4.1  5.3   Hemoglobin 12.0 - 15.0 g/dL 9.2  8.3  9.2   Hematocrit 36.0 - 46.0 % 30.4  28.2  28.0   Platelets 150 - 400 K/uL 263  275  289       Latest Ref Rng & Units 09/19/2022    8:11 AM 07/15/2022    8:16 AM 11/26/2021    1:36 PM  CMP  Glucose 70 - 99 mg/dL 409  97  94   BUN 6 - 20 mg/dL 24  20  19    Creatinine 0.44 - 1.00 mg/dL 8.11  9.14  7.82   Sodium 135 - 145 mmol/L 134  139  138   Potassium 3.5 - 5.1 mmol/L 4.3  4.3  4.3   Chloride 98 - 111 mmol/L 105  104  103   CO2 22 - 32 mmol/L 25  21  22    Calcium 8.9 - 10.3 mg/dL 8.8  9.6  9.3   Total Protein 6.5 - 8.1 g/dL 7.1  7.4  7.1   Total Bilirubin 0.3 - 1.2 mg/dL 0.2  0.3  0.3   Alkaline Phos 38 - 126 U/L 50  66  73   AST 15 - 41 U/L 19  21  23    ALT 0 - 44 U/L 22  23  27        Component Value Date/Time   IRON 105 09/19/2022 0811   IRON 26 (L) 11/26/2021 1336   TIBC 435 09/19/2022 0811   TIBC 424 11/26/2021 1336   FERRITIN 14 09/19/2022 0811   FERRITIN 11 (L) 11/26/2021 1336   IRONPCTSAT 24 09/19/2022 0811   IRONPCTSAT 6 (LL) 11/26/2021 1336     RADIOGRAPHIC STUDIES: I have personally reviewed the radiological images as listed and agreed with the findings in the report. No results found.

## 2022-09-27 ENCOUNTER — Inpatient Hospital Stay: Payer: BC Managed Care – PPO

## 2022-09-27 VITALS — BP 116/76 | HR 104 | Temp 97.5°F | Resp 18

## 2022-09-27 DIAGNOSIS — D508 Other iron deficiency anemias: Secondary | ICD-10-CM

## 2022-09-27 DIAGNOSIS — D500 Iron deficiency anemia secondary to blood loss (chronic): Secondary | ICD-10-CM | POA: Diagnosis not present

## 2022-09-27 MED ORDER — SODIUM CHLORIDE 0.9 % IV SOLN
INTRAVENOUS | Status: DC
  Filled 2022-09-27: qty 250

## 2022-09-27 MED ORDER — SODIUM CHLORIDE 0.9 % IV SOLN
200.0000 mg | Freq: Once | INTRAVENOUS | Status: AC
  Administered 2022-09-27: 200 mg via INTRAVENOUS
  Filled 2022-09-27: qty 200

## 2022-09-27 NOTE — Progress Notes (Signed)
Pt has been educated and understands. Pt declined to stay 30 mins after iron infusion. VSS.

## 2022-10-04 ENCOUNTER — Inpatient Hospital Stay: Payer: BC Managed Care – PPO

## 2022-10-04 VITALS — BP 103/80 | HR 88 | Temp 98.5°F

## 2022-10-04 DIAGNOSIS — D508 Other iron deficiency anemias: Secondary | ICD-10-CM

## 2022-10-04 DIAGNOSIS — D500 Iron deficiency anemia secondary to blood loss (chronic): Secondary | ICD-10-CM | POA: Diagnosis not present

## 2022-10-04 MED ORDER — SODIUM CHLORIDE 0.9 % IV SOLN
200.0000 mg | Freq: Once | INTRAVENOUS | Status: AC
  Administered 2022-10-04: 200 mg via INTRAVENOUS
  Filled 2022-10-04: qty 200

## 2022-10-04 MED ORDER — SODIUM CHLORIDE 0.9 % IV SOLN
INTRAVENOUS | Status: DC | PRN
  Filled 2022-10-04: qty 250

## 2022-10-04 NOTE — Progress Notes (Signed)
Patient declined to wait the 30 minutes for post iron infusion observation today. Tolerated infusion well. VSS. 

## 2022-10-06 ENCOUNTER — Ambulatory Visit: Payer: Self-pay | Admitting: General Surgery

## 2022-10-11 ENCOUNTER — Inpatient Hospital Stay: Payer: BC Managed Care – PPO

## 2022-10-11 VITALS — BP 114/80 | HR 92 | Temp 96.7°F | Resp 18

## 2022-10-11 DIAGNOSIS — D508 Other iron deficiency anemias: Secondary | ICD-10-CM

## 2022-10-11 DIAGNOSIS — D500 Iron deficiency anemia secondary to blood loss (chronic): Secondary | ICD-10-CM | POA: Diagnosis not present

## 2022-10-11 MED ORDER — SODIUM CHLORIDE 0.9 % IV SOLN
INTRAVENOUS | Status: DC
  Filled 2022-10-11: qty 250

## 2022-10-11 MED ORDER — SODIUM CHLORIDE 0.9 % IV SOLN
200.0000 mg | Freq: Once | INTRAVENOUS | Status: AC
  Administered 2022-10-11: 200 mg via INTRAVENOUS
  Filled 2022-10-11: qty 200

## 2022-10-20 HISTORY — PX: HEMORRHOID BANDING: SHX5850

## 2022-12-05 ENCOUNTER — Inpatient Hospital Stay: Payer: BC Managed Care – PPO | Attending: Oncology

## 2022-12-05 DIAGNOSIS — D509 Iron deficiency anemia, unspecified: Secondary | ICD-10-CM | POA: Insufficient documentation

## 2022-12-05 DIAGNOSIS — D508 Other iron deficiency anemias: Secondary | ICD-10-CM

## 2022-12-05 DIAGNOSIS — K642 Third degree hemorrhoids: Secondary | ICD-10-CM | POA: Diagnosis not present

## 2022-12-05 DIAGNOSIS — R000 Tachycardia, unspecified: Secondary | ICD-10-CM | POA: Diagnosis not present

## 2022-12-05 DIAGNOSIS — K648 Other hemorrhoids: Secondary | ICD-10-CM

## 2022-12-05 LAB — RETIC PANEL
Immature Retic Fract: 20.5 % — ABNORMAL HIGH (ref 2.3–15.9)
RBC.: 4 MIL/uL (ref 3.87–5.11)
Retic Count, Absolute: 57.6 10*3/uL (ref 19.0–186.0)
Retic Ct Pct: 1.4 % (ref 0.4–3.1)
Reticulocyte Hemoglobin: 31.1 pg (ref >27.9)

## 2022-12-05 LAB — CBC WITH DIFFERENTIAL/PLATELET
Abs Immature Granulocytes: 0.04 10*3/uL (ref 0.00–0.07)
Basophils Absolute: 0 10*3/uL (ref 0.0–0.1)
Basophils Relative: 1 %
Eosinophils Absolute: 0.1 10*3/uL (ref 0.0–0.5)
Eosinophils Relative: 2 %
HCT: 35.7 % — ABNORMAL LOW (ref 36.0–46.0)
Hemoglobin: 10.8 g/dL — ABNORMAL LOW (ref 12.0–15.0)
Immature Granulocytes: 1 %
Lymphocytes Relative: 27 %
Lymphs Abs: 1.2 10*3/uL (ref 0.7–4.0)
MCH: 26.7 pg (ref 26.0–34.0)
MCHC: 30.3 g/dL (ref 30.0–36.0)
MCV: 88.4 fL (ref 80.0–100.0)
Monocytes Absolute: 0.5 10*3/uL (ref 0.1–1.0)
Monocytes Relative: 12 %
Neutro Abs: 2.5 10*3/uL (ref 1.7–7.7)
Neutrophils Relative %: 57 %
Platelets: 220 10*3/uL (ref 150–400)
RBC: 4.04 MIL/uL (ref 3.87–5.11)
RDW: 14.4 % (ref 11.5–15.5)
WBC: 4.4 10*3/uL (ref 4.0–10.5)
nRBC: 0 % (ref 0.0–0.2)

## 2022-12-05 LAB — IRON AND TIBC
Iron: 27 ug/dL — ABNORMAL LOW (ref 28–170)
Saturation Ratios: 7 % — ABNORMAL LOW (ref 10.4–31.8)
TIBC: 403 ug/dL (ref 250–450)
UIBC: 376 ug/dL

## 2022-12-05 LAB — FERRITIN: Ferritin: 16 ng/mL (ref 11–307)

## 2022-12-13 ENCOUNTER — Inpatient Hospital Stay (HOSPITAL_BASED_OUTPATIENT_CLINIC_OR_DEPARTMENT_OTHER): Payer: BC Managed Care – PPO | Admitting: Oncology

## 2022-12-13 ENCOUNTER — Inpatient Hospital Stay: Payer: BC Managed Care – PPO

## 2022-12-13 ENCOUNTER — Encounter: Payer: Self-pay | Admitting: Oncology

## 2022-12-13 VITALS — BP 118/90 | HR 123 | Temp 97.4°F

## 2022-12-13 VITALS — BP 108/81 | HR 116 | Temp 98.5°F | Resp 18 | Wt 160.1 lb

## 2022-12-13 DIAGNOSIS — D509 Iron deficiency anemia, unspecified: Secondary | ICD-10-CM | POA: Diagnosis not present

## 2022-12-13 DIAGNOSIS — R000 Tachycardia, unspecified: Secondary | ICD-10-CM | POA: Diagnosis not present

## 2022-12-13 DIAGNOSIS — K648 Other hemorrhoids: Secondary | ICD-10-CM

## 2022-12-13 DIAGNOSIS — D508 Other iron deficiency anemias: Secondary | ICD-10-CM

## 2022-12-13 MED ORDER — IRON SUCROSE 20 MG/ML IV SOLN
200.0000 mg | Freq: Once | INTRAVENOUS | Status: AC
  Administered 2022-12-13: 200 mg via INTRAVENOUS
  Filled 2022-12-13: qty 10

## 2022-12-13 MED ORDER — SODIUM CHLORIDE 0.9% FLUSH
10.0000 mL | Freq: Once | INTRAVENOUS | Status: DC | PRN
Start: 2022-12-13 — End: 2022-12-13
  Filled 2022-12-13: qty 10

## 2022-12-13 NOTE — Progress Notes (Signed)
Hematology/Oncology Consult note [phone] [phone]    REFERRING PROVIDER: Erasmo Downer, MD  CHIEF COMPLAINTS/REASON FOR VISIT:  Iron deficiency anemia.   ASSESSMENT & PLAN:   IDA (iron deficiency anemia) Labs are reviewed and discussed with patient. Lab Results  Component Value Date   HGB 10.8 (L) 12/05/2022   TIBC 403 12/05/2022   IRONPCTSAT 7 (L) 12/05/2022   FERRITIN 16 12/05/2022    Hemoglobin has improved Recommend IV venofer weekly x 3     Hemorrhoids She continues to have rectal bleeding due to hemorrhoids.  Continue follow up with GI  Sinus tachycardia She is asymptomatic  Recommend patient to discuss with pcp for further evaluation.  TSH in May was decreased.     Orders Placed This Encounter  Procedures   CBC with Differential (Cancer Center Only)    Standing Status:   Future    Standing Expiration Date:   12/13/2023   Iron and TIBC    Standing Status:   Future    Standing Expiration Date:   12/13/2023   Ferritin    Standing Status:   Future    Standing Expiration Date:   12/13/2023   Retic Panel    Standing Status:   Future    Standing Expiration Date:   12/13/2023   4 months. Lab Virtual MD  All questions were answered. The patient knows to call the clinic with any problems, questions or concerns.  Rickard Patience, MD, PhD [facility] Hematology Oncology 12/13/2022     HISTORY OF PRESENTING ILLNESS:  Annette Phillips is a  57 y.o.  female with PMH listed below who was referred to me for anemia Reviewed patient's recent labs that was done.  She was found to have abnormal CBC on 11/26/21 with Hb 8.7, mcv 86, Reviewed patient's previous labs ordered by primary care physician's office, anemia is chronic onset , duration is since 2021.  09/12/21, hospitalized due to near syncope, SON, Hb 5.4, sp blood transfusion and IV venofer . She tolerates well with no side effects.   09/14/21 EGD: Small hiatal hernia, nonobstructing  Schatzki ring, stomach and duodenum normal.  Colonoscopy: Bleeding hemorrhoids grade 3.  10/08/21 capsule study negative.   + fatigue. + right side abdomen discomfort, intermittently. Recent US showed no acute finding. Fatty liver disease. History of cholecystectomy.  She denies recent chest pain on exertion, pre-syncopal episodes, or palpitations She had not noticed any recent bleeding such as epistaxis, hematuria or hematochezia. She is postmenopausal.  She denies over the counter NSAID ingestion.  She denies any pica and eats a variety of diet. Denies occupational toxin expose. She works as Careers adviser  INTERVAL HISTORY Annette Phillips is a 57 y.o. female who has above history reviewed by me today presents for follow up visit for iron deficiency anemia.  She tolerates IV Venofer treatments.  S/p hemorrhoid banding in Sept 2024, recently started to have rectal bleeding.   MEDICAL HISTORY:  Past Medical History:  Diagnosis Date   Allergy    Penicillin, Aspirin   Anemia July 2023   Blood transfusion without reported diagnosis July 2023   Goiter    Hypothyroid    Prediabetes    Recurrent cold sores     SURGICAL HISTORY: Past Surgical History:  Procedure Laterality Date   APPENDECTOMY     BREAST BIOPSY Right 2015   benign   CHOLECYSTECTOMY N/A 02/28/2019   Procedure: LAPAROSCOPIC CHOLECYSTECTOMY;  Surgeon: Henrene Dodge, MD;  Location: [facility]  ORS;  Service: General;  Laterality: N/A;   COLONOSCOPY WITH PROPOFOL N/A 09/14/2021   Procedure: COLONOSCOPY WITH PROPOFOL;  Surgeon: Midge Minium, MD;  Location: [facility] ENDOSCOPY;  Service: Endoscopy;  Laterality: N/A;   ESOPHAGOGASTRODUODENOSCOPY (EGD) WITH PROPOFOL N/A 02/27/2019   Procedure: ESOPHAGOGASTRODUODENOSCOPY (EGD) WITH PROPOFOL;  Surgeon: Toney Reil, MD;  Location: [facility] ENDOSCOPY;  Service: Gastroenterology;  Laterality: N/A;   ESOPHAGOGASTRODUODENOSCOPY (EGD) WITH PROPOFOL N/A 09/14/2021    Procedure: ESOPHAGOGASTRODUODENOSCOPY (EGD) WITH PROPOFOL;  Surgeon: Midge Minium, MD;  Location: [facility] ENDOSCOPY;  Service: Endoscopy;  Laterality: N/A;   GIVENS CAPSULE STUDY N/A 09/15/2021   Procedure: GIVENS CAPSULE STUDY;  Surgeon: Midge Minium, MD;  Location: [facility] ENDOSCOPY;  Service: Endoscopy;  Laterality: N/A;   KNEE ARTHROSCOPY Left 2008   torn meniscus    SOCIAL HISTORY: Social History   Socioeconomic History   Marital status: Divorced    Spouse name: Not on file   Number of children: 3   Years of education: Not on file   Highest education level: Not on file  Occupational History   Not on file  Tobacco Use   Smoking status: Never   Smokeless tobacco: Never  Vaping Use   Vaping status: Never Used  Substance and Sexual Activity   Alcohol use: Not Currently    Comment: rare   Drug use: Never   Sexual activity: Not Currently    Birth control/protection: Post-menopausal  Other Topics Concern   Not on file  Social History Narrative   Not on file   Social Determinants of Health   Financial Resource Strain: Not on file  Food Insecurity: Not on file  Transportation Needs: Not on file  Physical Activity: Not on file  Stress: Not on file  Social Connections: Not on file  Intimate Partner Violence: Not on file    FAMILY HISTORY: Family History  Problem Relation Age of Onset   Thyroid disease Mother    Diabetes Mellitus II Mother    Diabetes Mother    Thyroid disease Father    Healthy Sister    Hypothyroidism Daughter    Diabetes Son    Hypothyroidism Son    Diabetes Mellitus I Son    Kidney cancer Maternal Grandmother    Lung cancer Maternal Grandfather        smoker   Colon cancer Neg Hx    Breast cancer Neg Hx     ALLERGIES:  is allergic to aspirin and penicillin g.  MEDICATIONS:  Current Outpatient Medications  Medication Sig Dispense Refill   docusate sodium (COLACE) 100 MG capsule Take 100 mg by mouth 2 (two) times daily.     levothyroxine  (SYNTHROID) 100 MCG tablet Take 1 tablet (100 mcg total) by mouth daily before breakfast. 90 tablet 3   Multiple Vitamin (MULTI-VITAMINS) TABS Take 1 tablet by mouth in the morning.     No current facility-administered medications for this visit.    Review of Systems  Constitutional:  Positive for fatigue. Negative for appetite change, chills and fever.  HENT:   Negative for hearing loss and voice change.   Eyes:  Negative for eye problems.  Respiratory:  Negative for chest tightness and cough.   Cardiovascular:  Negative for chest pain.  Gastrointestinal:  Positive for blood in stool. Negative for abdominal distention and abdominal pain.  Endocrine: Negative for hot flashes.  Genitourinary:  Negative for difficulty urinating and frequency.   Musculoskeletal:  Negative for arthralgias.  Skin:  Negative for itching and rash.  Neurological:  Negative for extremity weakness.  Hematological:  Negative for adenopathy.  Psychiatric/Behavioral:  Negative for confusion.     PHYSICAL EXAMINATION: ECOG PERFORMANCE STATUS: 1 - Symptomatic but completely ambulatory Vitals:   12/13/22 1330  BP: 108/81  Pulse: (!) 116  Resp: 18  Temp: 98.5 F (36.9 C)  SpO2: 100%   Filed Weights   12/13/22 1330  Weight: 160 lb 1.6 oz (72.6 kg)    Physical Exam Constitutional:      General: She is not in acute distress. HENT:     Head: Normocephalic and atraumatic.  Eyes:     General: No scleral icterus. Cardiovascular:     Rate and Rhythm: Regular rhythm. Tachycardia present.  Pulmonary:     Effort: Pulmonary effort is normal. No respiratory distress.     Breath sounds: No wheezing.  Abdominal:     General: Bowel sounds are normal. There is no distension.     Palpations: Abdomen is soft.  Musculoskeletal:        General: Normal range of motion.     Cervical back: Normal range of motion and neck supple.  Skin:    General: Skin is warm and dry.     Findings: No erythema or rash.   Neurological:     Mental Status: She is alert and oriented to person, place, and time. Mental status is at baseline.     Cranial Nerves: No cranial nerve deficit.  Psychiatric:        Mood and Affect: Mood normal.      LABORATORY DATA:  I have reviewed the data as listed    Latest Ref Rng & Units 12/05/2022    8:04 AM 09/19/2022    8:11 AM 06/20/2022    7:56 AM  CBC  WBC 4.0 - 10.5 K/uL 4.4  4.3  4.1   Hemoglobin 12.0 - 15.0 g/dL 32.9  9.2  8.3   Hematocrit 36.0 - 46.0 % 35.7  30.4  28.2   Platelets 150 - 400 K/uL 220  263  275       Latest Ref Rng & Units 09/19/2022    8:11 AM 07/15/2022    8:16 AM 11/26/2021    1:36 PM  CMP  Glucose 70 - 99 mg/dL 518  97  94   BUN 6 - 20 mg/dL 24  20  19    Creatinine 0.44 - 1.00 mg/dL 8.41  6.60  6.30   Sodium 135 - 145 mmol/L 134  139  138   Potassium 3.5 - 5.1 mmol/L 4.3  4.3  4.3   Chloride 98 - 111 mmol/L 105  104  103   CO2 22 - 32 mmol/L 25  21  22    Calcium 8.9 - 10.3 mg/dL 8.8  9.6  9.3   Total Protein 6.5 - 8.1 g/dL 7.1  7.4  7.1   Total Bilirubin 0.3 - 1.2 mg/dL 0.2  0.3  0.3   Alkaline Phos 38 - 126 U/L 50  66  73   AST 15 - 41 U/L 19  21  23    ALT 0 - 44 U/L 22  23  27        Component Value Date/Time   IRON 27 (L) 12/05/2022 0804   IRON 26 (L) 11/26/2021 1336   TIBC 403 12/05/2022 0804   TIBC 424 11/26/2021 1336   FERRITIN 16 12/05/2022 0804   FERRITIN 11 (L) 11/26/2021 1336   IRONPCTSAT 7 (L) 12/05/2022 0804   IRONPCTSAT 6 (  LL) 11/26/2021 1336     RADIOGRAPHIC STUDIES: I have personally reviewed the radiological images as listed and agreed with the findings in the report. No results found.

## 2022-12-13 NOTE — Progress Notes (Signed)
Pt here for follow up. Heart rate is elevated, pt asymptomatic.

## 2022-12-13 NOTE — Assessment & Plan Note (Signed)
She continues to have rectal bleeding due to hemorrhoids.  Continue follow up with GI

## 2022-12-13 NOTE — Assessment & Plan Note (Addendum)
Labs are reviewed and discussed with patient. Lab Results  Component Value Date   HGB 10.8 (L) 12/05/2022   TIBC 403 12/05/2022   IRONPCTSAT 7 (L) 12/05/2022   FERRITIN 16 12/05/2022    Hemoglobin has improved Recommend IV venofer weekly x 3

## 2022-12-13 NOTE — Assessment & Plan Note (Addendum)
She is asymptomatic  Recommend patient to discuss with pcp for further evaluation.  TSH in May was decreased.

## 2022-12-15 ENCOUNTER — Ambulatory Visit: Payer: BC Managed Care – PPO | Admitting: Family Medicine

## 2022-12-15 VITALS — BP 118/85 | HR 124 | Temp 97.9°F | Resp 20 | Ht 67.0 in | Wt 159.1 lb

## 2022-12-15 DIAGNOSIS — E039 Hypothyroidism, unspecified: Secondary | ICD-10-CM

## 2022-12-15 DIAGNOSIS — R000 Tachycardia, unspecified: Secondary | ICD-10-CM | POA: Diagnosis not present

## 2022-12-15 DIAGNOSIS — Z1231 Encounter for screening mammogram for malignant neoplasm of breast: Secondary | ICD-10-CM

## 2022-12-15 NOTE — Assessment & Plan Note (Signed)
-  Consider heart monitor if tachycardia persists after Synthroid dosage reduction.

## 2022-12-15 NOTE — Assessment & Plan Note (Signed)
Overcorrected thyroid levels with persistent tachycardia despite improvement in anemia. Discussed the possibility of reducing Synthroid dosage. -Check thyroid levels today. -Plan to reduce Synthroid dosage based on lab results, likely to daily. -Consider heart monitor if tachycardia persists after Synthroid dosage reduction.

## 2022-12-15 NOTE — Progress Notes (Signed)
Established Patient Office Visit  Subjective   Patient ID: Annette Phillips, female    DOB: [birth year]  Age: 57 y.o. MRN: [id number]  Chief Complaint  Patient presents with   Follow-up    Heart rate concerns, thyriod levels    HPI  Discussed the use of AI scribe software for clinical note transcription with the patient, who gave verbal consent to proceed.  History of Present Illness   The patient, with a history of thyroid issues, anemia, and internal hemorrhoids, presents with concerns about a high heart rate. She suspects this may be due to her thyroid medication, as she has been overcorrected in the past. The patient also mentions recent wrist surgery and a hemorrhoid banding procedure. The patient's anemia has improved, but the high heart rate persists. The patient also mentions that she has been experiencing bleeding again after the hemorrhoid banding procedure, but she is scheduled for a follow-up appointment. The patient has been working long hours and dealing with multiple health issues over the past year.         ROS    Objective:     BP 118/85 (BP Location: Left Arm, Patient Position: Sitting, Cuff Size: Normal)   Pulse (!) 124   Temp 97.9 F (36.6 C)   Resp 20   Ht 5\' 7"  (1.702 m)   Wt 159 lb 1.6 oz (72.2 kg)   SpO2 99%   BMI 24.92 kg/m    Physical Exam Vitals reviewed.  Constitutional:      General: She is not in acute distress.    Appearance: Normal appearance. She is well-developed. She is not diaphoretic.  HENT:     Head: Normocephalic and atraumatic.  Eyes:     General: No scleral icterus.    Conjunctiva/sclera: Conjunctivae normal.  Neck:     Thyroid: No thyromegaly.  Cardiovascular:     Rate and Rhythm: Regular rhythm. Tachycardia present.     Heart sounds: Normal heart sounds. No murmur heard. Pulmonary:     Effort: Pulmonary effort is normal. No respiratory distress.     Breath sounds: Normal breath sounds. No wheezing, rhonchi or rales.   Musculoskeletal:     Cervical back: Neck supple.     Right lower leg: No edema.     Left lower leg: No edema.  Lymphadenopathy:     Cervical: No cervical adenopathy.  Skin:    General: Skin is warm and dry.     Findings: No rash.  Neurological:     Mental Status: She is alert. Mental status is at baseline.  Psychiatric:        Mood and Affect: Mood normal.        Behavior: Behavior normal.      No results found for any visits on 12/15/22.    The 10-year ASCVD risk score (Arnett DK, et al., 2019) is: 2.2%    Assessment & Plan:   Problem List Items Addressed This Visit       Endocrine   Hypothyroid - Primary    Overcorrected thyroid levels with persistent tachycardia despite improvement in anemia. Discussed the possibility of reducing Synthroid dosage. -Check thyroid levels today. -Plan to reduce Synthroid dosage based on lab results, likely to daily. -Consider heart monitor if tachycardia persists after Synthroid dosage reduction.      Relevant Orders   TSH     Other   Sinus tachycardia (Chronic)    -Consider heart monitor if tachycardia persists after Synthroid dosage reduction.  Other Visit Diagnoses     Breast cancer screening by mammogram       Relevant Orders   MM 3D SCREENING MAMMOGRAM BILATERAL BREAST           Anemia Improvement noted in iron and hemoglobin levels following treatment for internal hemorrhoid and iron supplementation. -Continue current treatment plan. -Follow up with Dr. Trisha Mangle regarding hemorrhoid banding.  General Health Maintenance -Order mammogram at [facility] facility.        Return for as scheduled.    Shirlee Latch, MD

## 2022-12-16 ENCOUNTER — Ambulatory Visit: Payer: Self-pay | Admitting: General Surgery

## 2022-12-16 DIAGNOSIS — K648 Other hemorrhoids: Secondary | ICD-10-CM

## 2022-12-16 HISTORY — DX: Other hemorrhoids: K64.8

## 2022-12-16 LAB — TSH: TSH: 0.887 u[IU]/mL (ref 0.450–4.500)

## 2022-12-16 NOTE — H&P (Signed)
HISTORY OF PRESENT ILLNESS:    Ms. Dupriest is a 57 y.o.female patient who comes for follow up after internal hemorrhoid banding.  Patient with internal hemorrhoid banding on 10/20/2022.  She endorses that she got better for couple of weeks but for the last month she has been seeing significant amount of bleeding again.  She denies any pain.  Patient endorses that bleeding is aggravated by bloating.  She cannot find any elevating factors.  She endorses that the internal hemorrhoid banding was significantly painful.      PAST MEDICAL HISTORY:  Past Medical History:  Diagnosis Date   Hypothyroidism         PAST SURGICAL HISTORY:   Past Surgical History:  Procedure Laterality Date   APPENDECTOMY     CHOLECYSTECTOMY     knee surgery Left          MEDICATIONS:  Outpatient Encounter Medications as of 12/16/2022  Medication Sig Dispense Refill   docusate (COLACE) 100 MG capsule Take 100 mg by mouth 2 (two) times daily as needed for Constipation     multivitamin tablet Take 1 tablet by mouth once daily.     SYNTHROID 100 mcg tablet      predniSONE (DELTASONE) 5 MG tablet 6 DAY TAPER, TAKE AS DIRECTED WITH FOOD 21 tablet 0   No facility-administered encounter medications on file as of 12/16/2022.     ALLERGIES:   Aspirin and Penicillin   SOCIAL HISTORY:  Social History   Socioeconomic History   Marital status: Divorced  Tobacco Use   Smoking status: Never   Smokeless tobacco: Never  Vaping Use   Vaping status: Never Used  Substance and Sexual Activity   Alcohol use: Yes   Drug use: No   Sexual activity: Defer   Social Drivers of Corporate investment banker Strain: Low Risk  (12/14/2022)   Received from [facility]   Overall Financial Resource Strain (CARDIA)    Difficulty of Paying Living Expenses: Not hard at all  Food Insecurity: No Food Insecurity (12/14/2022)   Received from [facility]   Hunger Vital Sign    Worried About Running Out of Food in the Last Year: Never  true    Ran Out of Food in the Last Year: Never true  Transportation Needs: No Transportation Needs (12/14/2022)   Received from [facility] - Transportation    Lack of Transportation (Medical): No    Lack of Transportation (Non-Medical): No    FAMILY HISTORY:  Family History  Problem Relation Name Age of Onset   No Known Problems Mother       GENERAL REVIEW OF SYSTEMS:   General ROS: negative for - chills, fatigue, fever, weight gain or weight loss Allergy and Immunology ROS: negative for - hives  Hematological and Lymphatic ROS: negative for - bleeding problems or bruising, negative for palpable nodes Endocrine ROS: negative for - heat or cold intolerance, hair changes Respiratory ROS: negative for - cough, shortness of breath or wheezing Cardiovascular ROS: no chest pain or palpitations GI ROS: negative for nausea, vomiting, abdominal pain, diarrhea, constipation Musculoskeletal ROS: negative for - joint swelling or muscle pain Neurological ROS: negative for - confusion, syncope Dermatological ROS: negative for pruritus and rash  PHYSICAL EXAM:  Vitals:   12/16/22 0819  BP: 139/87  Pulse: (!) 134  .  Ht:170.2 cm (5\' 7" ) Wt:72.6 kg (160 lb) ZOX:WRUE surface area is 1.85 meters squared. Body mass index is 25.06 kg/m.Marland Kitchen  GENERAL: Alert, active, oriented x3  HEENT: Pupils equal reactive to light. Extraocular movements are intact. Sclera clear. Palpebral conjunctiva normal red color.Pharynx clear.  NECK: Supple with no palpable mass and no adenopathy.  LUNGS: Sound clear with no rales rhonchi or wheezes.  HEART: Regular rhythm S1 and S2 without murmur.  RECTAL: Enlarged, irritated internal hemorrhoids with stigmata of bleeding.  No active bleeding.  No fissures.  EXTREMITIES: Well-developed well-nourished symmetrical with no dependent edema.  NEUROLOGICAL: Awake alert oriented, facial expression symmetrical, moving all extremities.      IMPRESSION:      Hemorrhoids, internal, with bleeding [K64.8]   Patient with significant anemia without any other source.  We tried internal hemorrhoid banding without significant improvement.  Due to the persistent bleeding we discussed today about proceeding with formal hemorrhoidectomy.  We discussed about the procedure, the goals, the benefit and his risk.  We discussed about the risks including bleeding, infection, incontinence, chronic anal pain, anal fissure, perianal abscess, among others.  The patient reports she understood and agreed to proceed with hemorrhoidectomy.         PLAN:  1.  We discussed about proceeding with hemorrhoidectomy (16109) 2.  Avoid taking aspirin of blood thinner 5 days before surgery 3. Contact us if you have any concern.   Patient verbalized understanding, all questions were answered, and were agreeable with the plan outlined above.   Carolan Shiver, MD  Electronically signed by Carolan Shiver, MD

## 2022-12-16 NOTE — H&P (View-Only) (Signed)
 HISTORY OF PRESENT ILLNESS:    Annette Phillips is a 57 y.o.female patient who comes for follow up after internal hemorrhoid banding.  Patient with internal hemorrhoid banding on 10/20/2022.  She endorses that she got better for couple of weeks but for the last month she has been seeing significant amount of bleeding again.  She denies any pain.  Patient endorses that bleeding is aggravated by bloating.  She cannot find any elevating factors.  She endorses that the internal hemorrhoid banding was significantly painful.      PAST MEDICAL HISTORY:  Past Medical History:  Diagnosis Date   Hypothyroidism         PAST SURGICAL HISTORY:   Past Surgical History:  Procedure Laterality Date   APPENDECTOMY     CHOLECYSTECTOMY     knee surgery Left          MEDICATIONS:  Outpatient Encounter Medications as of 12/16/2022  Medication Sig Dispense Refill   docusate (COLACE) 100 MG capsule Take 100 mg by mouth 2 (two) times daily as needed for Constipation     multivitamin tablet Take 1 tablet by mouth once daily.     SYNTHROID 100 mcg tablet      predniSONE (DELTASONE) 5 MG tablet 6 DAY TAPER, TAKE AS DIRECTED WITH FOOD 21 tablet 0   No facility-administered encounter medications on file as of 12/16/2022.     ALLERGIES:   Aspirin and Penicillin   SOCIAL HISTORY:  Social History   Socioeconomic History   Marital status: Divorced  Tobacco Use   Smoking status: Never   Smokeless tobacco: Never  Vaping Use   Vaping status: Never Used  Substance and Sexual Activity   Alcohol use: Yes   Drug use: No   Sexual activity: Defer   Social Drivers of Corporate investment banker Strain: Low Risk  (12/14/2022)   Received from [facility]   Overall Financial Resource Strain (CARDIA)    Difficulty of Paying Living Expenses: Not hard at all  Food Insecurity: No Food Insecurity (12/14/2022)   Received from [facility]   Hunger Vital Sign    Worried About Running Out of Food in the Last Year: Never  true    Ran Out of Food in the Last Year: Never true  Transportation Needs: No Transportation Needs (12/14/2022)   Received from [facility] - Transportation    Lack of Transportation (Medical): No    Lack of Transportation (Non-Medical): No    FAMILY HISTORY:  Family History  Problem Relation Name Age of Onset   No Known Problems Mother       GENERAL REVIEW OF SYSTEMS:   General ROS: negative for - chills, fatigue, fever, weight gain or weight loss Allergy and Immunology ROS: negative for - hives  Hematological and Lymphatic ROS: negative for - bleeding problems or bruising, negative for palpable nodes Endocrine ROS: negative for - heat or cold intolerance, hair changes Respiratory ROS: negative for - cough, shortness of breath or wheezing Cardiovascular ROS: no chest pain or palpitations GI ROS: negative for nausea, vomiting, abdominal pain, diarrhea, constipation Musculoskeletal ROS: negative for - joint swelling or muscle pain Neurological ROS: negative for - confusion, syncope Dermatological ROS: negative for pruritus and rash  PHYSICAL EXAM:  Vitals:   12/16/22 0819  BP: 139/87  Pulse: (!) 134  .  Ht:170.2 cm (5\' 7" ) Wt:72.6 kg (160 lb) ZOX:WRUE surface area is 1.85 meters squared. Body mass index is 25.06 kg/m.Marland Kitchen  GENERAL: Alert, active, oriented x3  HEENT: Pupils equal reactive to light. Extraocular movements are intact. Sclera clear. Palpebral conjunctiva normal red color.Pharynx clear.  NECK: Supple with no palpable mass and no adenopathy.  LUNGS: Sound clear with no rales rhonchi or wheezes.  HEART: Regular rhythm S1 and S2 without murmur.  RECTAL: Enlarged, irritated internal hemorrhoids with stigmata of bleeding.  No active bleeding.  No fissures.  EXTREMITIES: Well-developed well-nourished symmetrical with no dependent edema.  NEUROLOGICAL: Awake alert oriented, facial expression symmetrical, moving all extremities.      IMPRESSION:      Hemorrhoids, internal, with bleeding [K64.8]   Patient with significant anemia without any other source.  We tried internal hemorrhoid banding without significant improvement.  Due to the persistent bleeding we discussed today about proceeding with formal hemorrhoidectomy.  We discussed about the procedure, the goals, the benefit and his risk.  We discussed about the risks including bleeding, infection, incontinence, chronic anal pain, anal fissure, perianal abscess, among others.  The patient reports she understood and agreed to proceed with hemorrhoidectomy.         PLAN:  1.  We discussed about proceeding with hemorrhoidectomy (16109) 2.  Avoid taking aspirin of blood thinner 5 days before surgery 3. Contact us if you have any concern.   Patient verbalized understanding, all questions were answered, and were agreeable with the plan outlined above.   Annette Shiver, MD  Electronically signed by Annette Shiver, MD

## 2022-12-19 ENCOUNTER — Telehealth: Payer: Self-pay | Admitting: Family Medicine

## 2022-12-19 ENCOUNTER — Other Ambulatory Visit: Payer: Self-pay

## 2022-12-19 DIAGNOSIS — E039 Hypothyroidism, unspecified: Secondary | ICD-10-CM

## 2022-12-19 DIAGNOSIS — R7989 Other specified abnormal findings of blood chemistry: Secondary | ICD-10-CM

## 2022-12-19 MED ORDER — LEVOTHYROXINE SODIUM 88 MCG PO TABS: 88.0000 ug | ORAL_TABLET | Freq: Every day | ORAL | 1 refills | Status: DC

## 2022-12-19 NOTE — Telephone Encounter (Signed)
Done. Rx send in.

## 2022-12-19 NOTE — Telephone Encounter (Signed)
Pt is calling to ask if Dr. B can lower levothyroxine (SYNTHROID) 100 MCG tablet [[id number]] to  [id number] - [place],  - [address] Phone: [phone]  Fax: [phone]

## 2022-12-20 ENCOUNTER — Other Ambulatory Visit: Payer: Self-pay

## 2022-12-20 ENCOUNTER — Inpatient Hospital Stay: Payer: BC Managed Care – PPO | Attending: Oncology

## 2022-12-20 ENCOUNTER — Inpatient Hospital Stay: Payer: BC Managed Care – PPO

## 2022-12-20 ENCOUNTER — Encounter
Admission: RE | Admit: 2022-12-20 | Discharge: 2022-12-20 | Disposition: A | Discharge status: Home / Self Care | Payer: BC Managed Care – PPO | Source: Ambulatory Visit | Attending: General Surgery | Admitting: General Surgery

## 2022-12-20 VITALS — BP 116/75 | HR 102 | Temp 97.8°F | Resp 18

## 2022-12-20 DIAGNOSIS — D509 Iron deficiency anemia, unspecified: Secondary | ICD-10-CM | POA: Diagnosis present

## 2022-12-20 DIAGNOSIS — D508 Other iron deficiency anemias: Secondary | ICD-10-CM

## 2022-12-20 HISTORY — DX: Prediabetes: R73.03

## 2022-12-20 HISTORY — DX: Iron deficiency anemia, unspecified: D50.9

## 2022-12-20 HISTORY — DX: Tachycardia, unspecified: R00.0

## 2022-12-20 MED ORDER — IRON SUCROSE 20 MG/ML IV SOLN
200.0000 mg | Freq: Once | INTRAVENOUS | Status: AC
  Administered 2022-12-20: 200 mg via INTRAVENOUS
  Filled 2022-12-20: qty 10

## 2022-12-20 MED ORDER — SODIUM CHLORIDE 0.9% FLUSH
10.0000 mL | Freq: Once | INTRAVENOUS | Status: AC | PRN
  Administered 2022-12-20: 10 mL
  Filled 2022-12-20: qty 10

## 2022-12-20 NOTE — Patient Instructions (Addendum)
Your procedure is scheduled on: Wednesday, November 13 Report to the Registration Desk on the 1st floor of the [facility] To find out your arrival time, please call [phone] between 1PM - 3PM on: Tuesday, November 12 If your arrival time is 6:00 am, do not arrive before that time as the [facility] entrance doors do not open until 6:00 am.  REMEMBER: Instructions that are not followed completely may result in serious medical risk, up to and including death; or upon the discretion of your surgeon and anesthesiologist your surgery may need to be rescheduled.  Do not eat or drink after midnight the night before surgery.  No gum chewing or hard candies.  One week prior to surgery: starting November 6 Stop Anti-inflammatories (NSAIDS) such as Advil, Aleve, Ibuprofen, Motrin, Naproxen, Naprosyn and Aspirin based products such as Excedrin, Goody's Powder, BC Powder. Stop ANY OVER THE COUNTER supplements until after surgery. Stop multiple vitamins.  You may however, continue to take Tylenol if needed for pain up until the day of surgery.  Continue taking all of your other prescription medications up until the day of surgery.  DO NOT TAKE ANY MEDICATIONS ON THE DAY OF SURGERY.  No Alcohol for 24 hours before or after surgery.  No Smoking including e-cigarettes for 24 hours before surgery.  No chewable tobacco products for at least 6 hours before surgery.  No nicotine patches on the day of surgery.  Do not use any "recreational" drugs for at least a week (preferably 2 weeks) before your surgery.  Please be advised that the combination of cocaine and anesthesia may have negative outcomes, up to and including death. If you test positive for cocaine, your surgery will be cancelled.  On the morning of surgery brush your teeth with toothpaste and water, you may rinse your mouth with mouthwash if you wish. Do not swallow any toothpaste or mouthwash.  Do not wear jewelry, make-up,  hairpins, clips or nail polish.  For welded (permanent) jewelry: bracelets, anklets, waist bands, etc.  Please have this removed prior to surgery.  If it is not removed, there is a chance that hospital personnel will need to cut it off on the day of surgery.  Do not wear lotions, powders, or perfumes.   Do not shave body hair from the neck down 48 hours before surgery.  Contact lenses, hearing aids and dentures may not be worn into surgery.  Do not bring valuables to the hospital. [facility] is not responsible for any missing/lost belongings or valuables.   Notify your doctor if there is any change in your medical condition (cold, fever, infection).  Wear comfortable clothing (specific to your surgery type) to the hospital.  After surgery, you can help prevent lung complications by doing breathing exercises.  Take deep breaths and cough every 1-2 hours.   If you are being discharged the day of surgery, you will not be allowed to drive home. You will need a responsible individual to drive you home and stay with you for 24 hours after surgery.   If you are taking public transportation, you will need to have a responsible individual with you.  Please call the Pre-admissions Testing Dept. at [phone] if you have any questions about these instructions.  Surgery Visitation Policy:  Patients having surgery or a procedure may have two visitors.  Children under the age of 17 must have an adult with them who is not the patient.

## 2022-12-22 ENCOUNTER — Inpatient Hospital Stay: Admission: RE | Admit: 2022-12-22 | Payer: BC Managed Care – PPO | Source: Ambulatory Visit

## 2022-12-26 ENCOUNTER — Other Ambulatory Visit: Payer: BC Managed Care – PPO

## 2022-12-27 ENCOUNTER — Inpatient Hospital Stay: Payer: BC Managed Care – PPO

## 2022-12-27 VITALS — BP 126/88 | HR 98 | Temp 95.0°F | Resp 19

## 2022-12-27 DIAGNOSIS — D509 Iron deficiency anemia, unspecified: Secondary | ICD-10-CM | POA: Diagnosis not present

## 2022-12-27 DIAGNOSIS — D508 Other iron deficiency anemias: Secondary | ICD-10-CM

## 2022-12-27 MED ORDER — IRON SUCROSE 20 MG/ML IV SOLN
200.0000 mg | Freq: Once | INTRAVENOUS | Status: AC
  Administered 2022-12-27: 200 mg via INTRAVENOUS
  Filled 2022-12-27: qty 10

## 2022-12-27 MED ORDER — SODIUM CHLORIDE 0.9% FLUSH
10.0000 mL | Freq: Once | INTRAVENOUS | Status: AC | PRN
  Administered 2022-12-27: 10 mL
  Filled 2022-12-27: qty 10

## 2022-12-28 ENCOUNTER — Other Ambulatory Visit: Payer: Self-pay

## 2022-12-28 ENCOUNTER — Ambulatory Visit
Admission: RE | Admit: 2022-12-28 | Discharge: 2022-12-28 | Disposition: A | Discharge status: Home / Self Care | Payer: BC Managed Care – PPO | Attending: General Surgery | Admitting: General Surgery

## 2022-12-28 ENCOUNTER — Ambulatory Visit: Payer: BC Managed Care – PPO | Admitting: Certified Registered Nurse Anesthetist

## 2022-12-28 ENCOUNTER — Encounter: Payer: Self-pay | Admitting: General Surgery

## 2022-12-28 ENCOUNTER — Encounter
Admission: RE | Disposition: A | Discharge status: Home / Self Care | Payer: Self-pay | Source: Home / Self Care | Attending: General Surgery

## 2022-12-28 DIAGNOSIS — K644 Residual hemorrhoidal skin tags: Secondary | ICD-10-CM | POA: Insufficient documentation

## 2022-12-28 DIAGNOSIS — K648 Other hemorrhoids: Secondary | ICD-10-CM | POA: Insufficient documentation

## 2022-12-28 HISTORY — PX: HEMORRHOID SURGERY: SHX153

## 2022-12-28 SURGERY — HEMORRHOIDECTOMY
Anesthesia: General | Site: Rectum

## 2022-12-28 MED ORDER — GELATIN ABSORBABLE 100 EX MISC
CUTANEOUS | Status: DC | PRN
  Administered 2022-12-28: 1 via TOPICAL

## 2022-12-28 MED ORDER — CHLORHEXIDINE GLUCONATE 0.12 % MT SOLN
OROMUCOSAL | Status: AC
  Filled 2022-12-28: qty 15

## 2022-12-28 MED ORDER — BUPIVACAINE LIPOSOME 1.3 % IJ SUSP
INTRAMUSCULAR | Status: DC | PRN
  Administered 2022-12-28: 20 mL

## 2022-12-28 MED ORDER — ONDANSETRON HCL 4 MG/2ML IJ SOLN
INTRAMUSCULAR | Status: DC | PRN
  Administered 2022-12-28: 4 mg via INTRAVENOUS

## 2022-12-28 MED ORDER — TRAMADOL HCL 50 MG PO TABS: 50.0000 mg | ORAL_TABLET | Freq: Four times a day (QID) | ORAL | 0 refills | Status: DC | PRN

## 2022-12-28 MED ORDER — ONDANSETRON HCL 4 MG/2ML IJ SOLN
INTRAMUSCULAR | Status: AC
  Filled 2022-12-28: qty 2

## 2022-12-28 MED ORDER — MIDAZOLAM HCL 2 MG/2ML IJ SOLN
INTRAMUSCULAR | Status: AC
  Filled 2022-12-28: qty 2

## 2022-12-28 MED ORDER — OXYCODONE HCL 5 MG PO TABS: 5.0000 mg | ORAL_TABLET | Freq: Once | ORAL | Status: DC | PRN

## 2022-12-28 MED ORDER — PHENYLEPHRINE 80 MCG/ML (10ML) SYRINGE FOR IV PUSH (FOR BLOOD PRESSURE SUPPORT)
PREFILLED_SYRINGE | INTRAVENOUS | Status: AC
  Filled 2022-12-28: qty 10

## 2022-12-28 MED ORDER — CHLORHEXIDINE GLUCONATE 0.12 % MT SOLN
15.0000 mL | Freq: Once | OROMUCOSAL | Status: AC
  Administered 2022-12-28: 15 mL via OROMUCOSAL

## 2022-12-28 MED ORDER — ORAL CARE MOUTH RINSE: 15.0000 mL | Freq: Once | OROMUCOSAL | Status: AC

## 2022-12-28 MED ORDER — METRONIDAZOLE 500 MG/100ML IV SOLN
500.0000 mg | INTRAVENOUS | Status: DC
  Filled 2022-12-28: qty 100

## 2022-12-28 MED ORDER — TRAMADOL HCL 50 MG PO TABS
ORAL_TABLET | ORAL | Status: AC
  Filled 2022-12-28: qty 1

## 2022-12-28 MED ORDER — BUPIVACAINE-EPINEPHRINE (PF) 0.5% -1:200000 IJ SOLN
INTRAMUSCULAR | Status: AC
  Filled 2022-12-28: qty 30

## 2022-12-28 MED ORDER — PHENYLEPHRINE 80 MCG/ML (10ML) SYRINGE FOR IV PUSH (FOR BLOOD PRESSURE SUPPORT)
PREFILLED_SYRINGE | INTRAVENOUS | Status: DC | PRN
  Administered 2022-12-28 (×3): 80 ug via INTRAVENOUS
  Administered 2022-12-28: 160 ug via INTRAVENOUS

## 2022-12-28 MED ORDER — CIPROFLOXACIN IN D5W 400 MG/200ML IV SOLN
INTRAVENOUS | Status: AC
  Filled 2022-12-28: qty 200

## 2022-12-28 MED ORDER — LIDOCAINE HCL (PF) 2 % IJ SOLN
INTRAMUSCULAR | Status: AC
Start: 2022-12-28 — End: ?
  Filled 2022-12-28: qty 5

## 2022-12-28 MED ORDER — DEXAMETHASONE SODIUM PHOSPHATE 10 MG/ML IJ SOLN
INTRAMUSCULAR | Status: DC | PRN
  Administered 2022-12-28: 10 mg via INTRAVENOUS

## 2022-12-28 MED ORDER — MIDAZOLAM HCL 2 MG/2ML IJ SOLN
INTRAMUSCULAR | Status: DC | PRN
  Administered 2022-12-28: 2 mg via INTRAVENOUS

## 2022-12-28 MED ORDER — BUPIVACAINE LIPOSOME 1.3 % IJ SUSP
INTRAMUSCULAR | Status: AC
  Filled 2022-12-28: qty 20

## 2022-12-28 MED ORDER — 0.9 % SODIUM CHLORIDE (POUR BTL) OPTIME
TOPICAL | Status: DC | PRN
  Administered 2022-12-28: 500 mL

## 2022-12-28 MED ORDER — CIPROFLOXACIN IN D5W 400 MG/200ML IV SOLN
400.0000 mg | INTRAVENOUS | Status: AC
  Administered 2022-12-28: 400 mg via INTRAVENOUS

## 2022-12-28 MED ORDER — BUPIVACAINE-EPINEPHRINE 0.5% -1:200000 IJ SOLN
INTRAMUSCULAR | Status: DC | PRN
  Administered 2022-12-28: 30 mL

## 2022-12-28 MED ORDER — FENTANYL CITRATE (PF) 100 MCG/2ML IJ SOLN: 25.0000 ug | INTRAMUSCULAR | Status: DC | PRN

## 2022-12-28 MED ORDER — MICROFIBRILLAR COLL HEMOSTAT EX PADS
MEDICATED_PAD | CUTANEOUS | Status: DC | PRN
  Administered 2022-12-28: 1 via TOPICAL

## 2022-12-28 MED ORDER — OXYCODONE-ACETAMINOPHEN 5-325 MG PO TABS: 1.0000 | ORAL_TABLET | ORAL | 0 refills | Status: DC | PRN

## 2022-12-28 MED ORDER — LACTATED RINGERS IV SOLN: INTRAVENOUS | Status: DC

## 2022-12-28 MED ORDER — PROPOFOL 10 MG/ML IV BOLUS
INTRAVENOUS | Status: AC
  Filled 2022-12-28: qty 40

## 2022-12-28 MED ORDER — PROPOFOL 10 MG/ML IV BOLUS
INTRAVENOUS | Status: DC | PRN
  Administered 2022-12-28: 20 mg via INTRAVENOUS
  Administered 2022-12-28: 180 mg via INTRAVENOUS

## 2022-12-28 MED ORDER — GELATIN ABSORBABLE 100 CM EX MISC
CUTANEOUS | Status: AC
  Filled 2022-12-28: qty 1

## 2022-12-28 MED ORDER — LIDOCAINE HCL (CARDIAC) PF 100 MG/5ML IV SOSY
PREFILLED_SYRINGE | INTRAVENOUS | Status: DC | PRN
  Administered 2022-12-28: 80 mg via INTRAVENOUS

## 2022-12-28 MED ORDER — FENTANYL CITRATE (PF) 100 MCG/2ML IJ SOLN
INTRAMUSCULAR | Status: DC | PRN
  Administered 2022-12-28 (×4): 25 ug via INTRAVENOUS

## 2022-12-28 MED ORDER — TRAMADOL HCL 50 MG PO TABS
50.0000 mg | ORAL_TABLET | Freq: Four times a day (QID) | ORAL | Status: DC
  Administered 2022-12-28: 50 mg via ORAL

## 2022-12-28 MED ORDER — OXYCODONE HCL 5 MG/5ML PO SOLN: 5.0000 mg | Freq: Once | ORAL | Status: DC | PRN

## 2022-12-28 MED ORDER — FENTANYL CITRATE (PF) 100 MCG/2ML IJ SOLN
INTRAMUSCULAR | Status: AC
  Filled 2022-12-28: qty 2

## 2022-12-28 SURGICAL SUPPLY — 38 items
BLADE SURG 15 STRL LF DISP TIS (BLADE) ×1 IMPLANT
BLADE SURG 15 STRL SS (BLADE) ×1
BRIEF MESH DISP 2XL (UNDERPADS AND DIAPERS) ×1 IMPLANT
DISSECTOR SURG LIGASURE 21 (MISCELLANEOUS) IMPLANT
DRAPE LAPAROTOMY 100X77 ABD (DRAPES) ×1 IMPLANT
DRAPE LEGGINS SURG 28X43 STRL (DRAPES) ×1 IMPLANT
DRAPE UNDER BUTTOCK W/FLU (DRAPES) ×1 IMPLANT
ELECT REM PT RETURN 9FT ADLT (ELECTROSURGICAL) ×1
ELECTRODE REM PT RTRN 9FT ADLT (ELECTROSURGICAL) ×1 IMPLANT
GAUZE 4X4 16PLY ~~LOC~~+RFID DBL (SPONGE) ×1 IMPLANT
GAUZE SPONGE 4X4 12PLY STRL (GAUZE/BANDAGES/DRESSINGS) ×1 IMPLANT
GLOVE BIO SURGEON STRL SZ 6.5 (GLOVE) ×1 IMPLANT
GLOVE BIOGEL PI IND STRL 6.5 (GLOVE) ×1 IMPLANT
GOWN STRL REUS W/ TWL LRG LVL3 (GOWN DISPOSABLE) ×2 IMPLANT
GOWN STRL REUS W/TWL LRG LVL3 (GOWN DISPOSABLE) ×2
HEMOSTAT SURGICEL 2X3 (HEMOSTASIS) IMPLANT
KIT TURNOVER CYSTO (KITS) ×1 IMPLANT
LABEL OR SOLS (LABEL) ×1 IMPLANT
MANIFOLD NEPTUNE II (INSTRUMENTS) ×1 IMPLANT
NDL HYPO 22X1.5 SAFETY MO (MISCELLANEOUS) ×1 IMPLANT
NEEDLE HYPO 22X1.5 SAFETY MO (MISCELLANEOUS) ×1 IMPLANT
NS IRRIG 500ML POUR BTL (IV SOLUTION) ×1 IMPLANT
PACK BASIN MINOR ARMC (MISCELLANEOUS) ×1 IMPLANT
PAD ABD DERMACEA PRESS 5X9 (GAUZE/BANDAGES/DRESSINGS) ×1 IMPLANT
PAD PREP OB/GYN DISP 24X41 (PERSONAL CARE ITEMS) ×1 IMPLANT
SOL PREP PVP 2OZ (MISCELLANEOUS) ×1
SOLUTION PREP PVP 2OZ (MISCELLANEOUS) ×1 IMPLANT
SURGILUBE 2OZ TUBE FLIPTOP (MISCELLANEOUS) ×1 IMPLANT
SUT ETHILON 3-0 FS-10 30 BLK (SUTURE)
SUT VIC AB 2-0 SH 27 (SUTURE) ×1
SUT VIC AB 2-0 SH 27XBRD (SUTURE) ×1 IMPLANT
SUT VIC AB 3-0 SH 27 (SUTURE) ×2
SUT VIC AB 3-0 SH 27X BRD (SUTURE) ×1 IMPLANT
SUTURE EHLN 3-0 FS-10 30 BLK (SUTURE) IMPLANT
SYR 10ML LL (SYRINGE) ×1 IMPLANT
SYR BULB IRRIG 60ML STRL (SYRINGE) ×1 IMPLANT
TRAP FLUID SMOKE EVACUATOR (MISCELLANEOUS) ×1 IMPLANT
WATER STERILE IRR 500ML POUR (IV SOLUTION) ×1 IMPLANT

## 2022-12-28 NOTE — Transfer of Care (Signed)
Immediate Anesthesia Transfer of Care Note  Patient: Annette Phillips  Procedure(s) Performed: HEMORRHOIDECTOMY (Rectum)  Patient Location: PACU  Anesthesia Type:General  Level of Consciousness: drowsy  Airway & Oxygen Therapy: Patient Spontanous Breathing and Patient connected to nasal cannula oxygen  Post-op Assessment: Report given to RN and Post -op Vital signs reviewed and stable  Post vital signs: Reviewed and stable  Last Vitals:  Vitals Value Taken Time  BP 93/63 12/28/22 0915  Temp    Pulse 84 12/28/22 0918  Resp 9 12/28/22 0918  SpO2 99 % 12/28/22 0918  Vitals shown include unfiled device data.  Last Pain:  Vitals:   12/28/22 0736  TempSrc: Oral  PainSc: 0-No pain         Complications: No notable events documented.

## 2022-12-28 NOTE — Anesthesia Procedure Notes (Signed)
Procedure Name: LMA Insertion Date/Time: 12/28/2022 8:21 AM  Performed by: Hezzie Bump, CRNAPre-anesthesia Checklist: Patient identified, Patient being monitored, Timeout performed, Emergency Drugs available and Suction available Patient Re-evaluated:Patient Re-evaluated prior to induction Oxygen Delivery Method: Circle system utilized Preoxygenation: Pre-oxygenation with 100% oxygen Induction Type: IV induction Ventilation: Mask ventilation without difficulty LMA: LMA inserted and LMA with gastric port inserted LMA Size: 4.0 Tube type: Oral Number of attempts: 1 Placement Confirmation: positive ETCO2 and breath sounds checked- equal and bilateral Tube secured with: Tape Dental Injury: Teeth and Oropharynx as per pre-operative assessment

## 2022-12-28 NOTE — Anesthesia Preprocedure Evaluation (Signed)
Anesthesia Evaluation  Patient identified by MRN, date of birth, ID band Patient awake    Reviewed: Allergy & Precautions, NPO status , Patient's Chart, lab work & pertinent test results  Airway Mallampati: II  TM Distance: >3 FB Neck ROM: full    Dental  (+) Chipped, Dental Advidsory Given   Pulmonary neg pulmonary ROS   Pulmonary exam normal        Cardiovascular negative cardio ROS Normal cardiovascular exam     Neuro/Psych negative neurological ROS  negative psych ROS   GI/Hepatic negative GI ROS, Neg liver ROS,,,  Endo/Other  Hypothyroidism    Renal/GU      Musculoskeletal   Abdominal   Peds  Hematology negative hematology ROS (+)   Anesthesia Other Findings Past Medical History: No date: Allergy     Comment:  Penicillin, Aspirin July 2023: Anemia July 2023: Blood transfusion without reported diagnosis No date: Goiter No date: Hypothyroid 12/2022: Internal hemorrhoid, bleeding No date: Iron deficiency anemia No date: Pre-diabetes 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 No date: Sinus tachycardia  Past Surgical History: No date: APPENDECTOMY 05/21/2009: BREAST BIOPSY; Right     Comment:  benign 02/28/2019: CHOLECYSTECTOMY; N/A     Comment:  Procedure: LAPAROSCOPIC CHOLECYSTECTOMY;  Surgeon:               Henrene Dodge, MD;  Location: [facility] ORS;  Service:               General;  Laterality: N/A; 09/14/2021: COLONOSCOPY WITH PROPOFOL; N/A     Comment:  Procedure: COLONOSCOPY WITH PROPOFOL;  Surgeon: Midge Minium, MD;  Location: [facility] ENDOSCOPY;  Service:               Endoscopy;  Laterality: N/A; 06/2022: DE QUERVAIN'S RELEASE; Right 02/27/2019: ESOPHAGOGASTRODUODENOSCOPY (EGD) WITH PROPOFOL; N/A     Comment:  Procedure: ESOPHAGOGASTRODUODENOSCOPY (EGD) WITH               PROPOFOL;  Surgeon: Toney Reil, MD;  Location:               [facility] END[facility]COPY;  Service: Gastroenterology;   Laterality:               N/A; 09/14/2021: ESOPHAGOGASTRODUODENOSCOPY (EGD) WITH PROPOFOL; N/A     Comment:  Procedure: ESOPHAGOGASTRODUODENOSCOPY (EGD) WITH               PROPOFOL;  Surgeon: Midge Minium, MD;  Location: [facility]               ENDOSCOPY;  Service: Endoscopy;  Laterality: N/A; 09/15/2021: GIVENS CAPSULE STUDY; N/A     Comment:  Procedure: GIVENS CAPSULE STUDY;  Surgeon: Midge Minium,              MD;  Location: [facility] ENDOSCOPY;  Service: Endoscopy;                Laterality: N/A; 10/20/2022: HEMORRHOID BANDING 01/04/2007: KNEE ARTHROSCOPY; Left     Comment:  torn meniscus 09/2022: VITRECTOMY; Right  BMI    Body Mass Index: 24.28 kg/m      Reproductive/Obstetrics negative OB ROS                             Anesthesia Physical Anesthesia Plan  ASA: 2  Anesthesia Plan: General   Post-op Pain Management:    Induction: Intravenous  PONV Risk  Score and Plan: 3 and Ondansetron, Dexamethasone and Midazolam  Airway Management Planned: LMA  Additional Equipment:   Intra-op Plan:   Post-operative Plan: Extubation in OR  Informed Consent: I have reviewed the patients History and Physical, chart, labs and discussed the procedure including the risks, benefits and alternatives for the proposed anesthesia with the patient or authorized representative who has indicated his/her understanding and acceptance.     Dental Advisory Given  Plan Discussed with: Anesthesiologist, CRNA and Surgeon  Anesthesia Plan Comments: (Patient consented for risks of anesthesia including but not limited to:  - adverse reactions to medications - damage to eyes, teeth, lips or other oral mucosa - nerve damage due to positioning  - sore throat or hoarseness - Damage to heart, brain, nerves, lungs, other parts of body or loss of life  Patient voiced understanding and assent.)       Anesthesia Quick Evaluation

## 2022-12-28 NOTE — Op Note (Signed)
Preoperative diagnosis: Internal and external hemorrhoids with bleeding.   Postoperative diagnosis: Internal and external hemorrhoids with bleeding.  Procedure: Anoscopy, hemorrhoidectomy x3.  Surgeon: Dr. Hazle Quant  Anesthesia: General  Wound classification: Clean Contaminated  Indications: Patient is a 57 y.o. female was found to have symptomatic hemorrhoids refractory to medical managemen.   Findings: 1. Third degree hemorrhoids 2. Internal and external anal sphincter identified and preserved 3. Adequate hemostasis  Description of procedure: The patient was brought to the operating room and spinal anesthesia was induced. Patient was placed in the prone jackknife position. A time-out was completed verifying correct patient, procedure, site, positioning, and implant(s) and/or special equipment prior to beginning this procedure. The buttocks were taped apart.  The perineum was prepped and draped in standard sterile fashion. Local anesthetic was injected as a perianal block. An anoscope was introduced and the three hemorrhoidal pedicles were identified. A Kelly clamp was placed near the base of each pedicle near the dentate line and retracted externally to exteriorize the hemorrhoidal pedicles.  Each pedicle was excised in turn in the following fashion. An elliptical incision was made extending from perianal skin to anorectal ring including both internal and external hemorrhoids and excising a minimum amount of anoderm. Flaps were developed on both aspects of the incision, taking care to elevate only skin and mucosa. The dilated venous mass was dissected using LigaSure device from the underlying sphincter muscle. The pedicle was amputated from the base. Hemostasis was achieved using electrocautery. Following hemostasis, the skin and mucosal incisions were closed with a running lock stitch of 2-0 Vicryl on the mucosal aspect and converted to subcuticular once skin was encountered. The anal  canal was then injected with local anesthetic. A gauze pad was tucked between the gluteal folds.  The patient tolerated the procedure well and was taken to the postanesthesia care unit in stable condition.   Specimen: hemorrhoids  Complications: None  EBL: 10 mL

## 2022-12-28 NOTE — Interval H&P Note (Signed)
History and Physical Interval Note:  12/28/2022 7:32 AM  Annette Phillips  has presented today for surgery, with the diagnosis of K64.8 hemorrhoids, internal w/ bleeding.  The various methods of treatment have been discussed with the patient and family. After consideration of risks, benefits and other options for treatment, the patient has consented to  Procedure(s): HEMORRHOIDECTOMY (N/A) as a surgical intervention.  The patient's history has been reviewed, patient examined, no change in status, stable for surgery.  I have reviewed the patient's chart and labs.  Questions were answered to the patient's satisfaction.     Carolan Shiver

## 2022-12-28 NOTE — Discharge Instructions (Signed)

## 2022-12-28 NOTE — Anesthesia Postprocedure Evaluation (Signed)
Anesthesia Post Note  Patient: Annette Phillips  Procedure(s) Performed: HEMORRHOIDECTOMY (Rectum)  Patient location during evaluation: PACU Anesthesia Type: General Level of consciousness: awake and alert Pain management: pain level controlled Vital Signs Assessment: post-procedure vital signs reviewed and stable Respiratory status: spontaneous breathing, nonlabored ventilation, respiratory function stable and patient connected to nasal cannula oxygen Cardiovascular status: blood pressure returned to baseline and stable Postop Assessment: no apparent nausea or vomiting Anesthetic complications: no  No notable events documented.   Last Vitals:  Vitals:   12/28/22 0915 12/28/22 0930  BP: 93/63 103/73  Pulse: 77 75  Resp: 16 11  Temp: (!) 36.4 C   SpO2: 93% 98%    Last Pain:  Vitals:   12/28/22 0930  TempSrc:   PainSc: Asleep                 Stephanie Coup

## 2022-12-29 LAB — SURGICAL PATHOLOGY

## 2022-12-30 ENCOUNTER — Telehealth: Payer: BC Managed Care – PPO | Admitting: Oncology

## 2023-01-03 ENCOUNTER — Ambulatory Visit: Payer: BC Managed Care – PPO

## 2023-01-03 ENCOUNTER — Inpatient Hospital Stay: Payer: BC Managed Care – PPO

## 2023-02-13 ENCOUNTER — Encounter: Payer: Self-pay | Admitting: Oncology

## 2023-03-01 ENCOUNTER — Encounter: Payer: Self-pay | Admitting: Oncology

## 2023-03-03 ENCOUNTER — Telehealth: Payer: 59 | Admitting: Family Medicine

## 2023-04-03 ENCOUNTER — Telehealth: Payer: 59 | Admitting: Family Medicine

## 2023-04-03 DIAGNOSIS — R051 Acute cough: Secondary | ICD-10-CM | POA: Diagnosis not present

## 2023-04-03 DIAGNOSIS — R6889 Other general symptoms and signs: Secondary | ICD-10-CM | POA: Diagnosis not present

## 2023-04-03 MED ORDER — OSELTAMIVIR PHOSPHATE 75 MG PO CAPS: 75.0000 mg | ORAL_CAPSULE | Freq: Two times a day (BID) | ORAL | 0 refills | Status: AC

## 2023-04-03 NOTE — Progress Notes (Signed)
MyChart Video Visit    Virtual Visit via Video Note   This format is felt to be most appropriate for this patient at this time. Physical exam was limited by quality of the video and audio technology used for the visit.   Patient location: HOME Provider location: [facility]  I discussed the limitations of evaluation and management by telemedicine and the availability of in person appointments. The patient expressed understanding and agreed to proceed.  Patient: Annette Phillips   DOB: Jan 15, 1966   58 y.o. Female  MRN: [id number] Visit Date: 04/03/2023  Today's healthcare provider: Mila Merry, MD   No chief complaint on file.  Subjective    Discussed the use of AI scribe software for clinical note transcription with the patient, who gave verbal consent to proceed.  History of Present Illness   Annette Phillips is a 58 year old female who presents with flu-like symptoms after close contact with her father who was diagnosed with the flu. She developed symptoms after caring for her father, who was diagnosed with the flu on Friday. Symptoms began on Saturday and include fever ranging from 101 to 102 degrees Fahrenheit, body aches, headache, chills, cough, congestion, nausea, vomiting, and diarrhea.  She describes a sore throat primarily after coughing and reports productive cough with phlegm that is sometimes clear and sometimes neon yellow. No breathing difficulties or shortness of breath unless coughing.  She has a recent history of bronchitis at the end of January, from which she was just recovering. She previously used a prescription cough syrup for bronchitis but discontinued it due to dizziness.  Her current medications include Robitussin cold formula, Tylenol, and Motrin, which she alternates for body aches and fever. She has not taken a COVID test.       Medications: Outpatient Medications Prior to Visit  Medication Sig   docusate sodium (COLACE) 100 MG capsule Take 200 mg by mouth  daily.   Iron Sucrose (VENOFER IV) Inject into the vein once a week.   levothyroxine (SYNTHROID) 88 MCG tablet Take 1 tablet (88 mcg total) by mouth daily before breakfast. (Patient taking differently: Take 88 mcg by mouth every evening.)   Metamucil Fiber CHEW Chew 2 each by mouth daily.   Multiple Vitamin (MULTI-VITAMINS) TABS Take 1 tablet by mouth in the morning.   traMADol (ULTRAM) 50 MG tablet Take 1 tablet (50 mg total) by mouth every 6 (six) hours as needed.   No facility-administered medications prior to visit.     Objective    There were no vitals taken for this visit.  Physical Exam  Awake, alert, oriented x 3. In no apparent distress. Coughing throughout interview.      Assessment & Plan       Flu like symptoms with cough Recent exposure to father with confirmed flu. Symptoms include fever, body aches, headache, chills, cough, congestion, nausea, vomiting, and diarrhea. No shortness of breath unless coughing. Cough productive of clear to yellow phlegm. Recent history of bronchitis in January. -Start Tamiflu as soon as possible, twice daily. -Continue over-the-counter medications as needed for symptom relief. -Notify office if symptoms worsen or if there is difficulty breathing.  Follow-up Monitor symptoms and report any worsening or development of shortness of breath.        I discussed the assessment and treatment plan with the patient. The patient was provided an opportunity to ask questions and all were answered. The patient agreed with the plan and demonstrated an understanding of the instructions.  The patient was advised to call back or seek an in-person evaluation if the symptoms worsen or if the condition fails to improve as anticipated.  I provided 9 minutes of non-face-to-face time during this encounter.   Mila Merry, MD [facility] [phone] (phone) [phone] (fax)  [facility]

## 2023-04-10 ENCOUNTER — Inpatient Hospital Stay: Payer: 59 | Attending: Oncology

## 2023-04-10 DIAGNOSIS — D509 Iron deficiency anemia, unspecified: Secondary | ICD-10-CM | POA: Diagnosis present

## 2023-04-10 DIAGNOSIS — D508 Other iron deficiency anemias: Secondary | ICD-10-CM

## 2023-04-10 LAB — CBC WITH DIFFERENTIAL (CANCER CENTER ONLY)
Abs Immature Granulocytes: 0.06 10*3/uL (ref 0.00–0.07)
Basophils Absolute: 0 10*3/uL (ref 0.0–0.1)
Basophils Relative: 0 %
Eosinophils Absolute: 0 10*3/uL (ref 0.0–0.5)
Eosinophils Relative: 1 %
HCT: 42.2 % (ref 36.0–46.0)
Hemoglobin: 14.1 g/dL (ref 12.0–15.0)
Immature Granulocytes: 1 %
Lymphocytes Relative: 23 %
Lymphs Abs: 1.2 10*3/uL (ref 0.7–4.0)
MCH: 28.1 pg (ref 26.0–34.0)
MCHC: 33.4 g/dL (ref 30.0–36.0)
MCV: 84.2 fL (ref 80.0–100.0)
Monocytes Absolute: 0.5 10*3/uL (ref 0.1–1.0)
Monocytes Relative: 10 %
Neutro Abs: 3.4 10*3/uL (ref 1.7–7.7)
Neutrophils Relative %: 65 %
Platelet Count: 236 10*3/uL (ref 150–400)
RBC: 5.01 MIL/uL (ref 3.87–5.11)
RDW: 14.4 % (ref 11.5–15.5)
WBC Count: 5.3 10*3/uL (ref 4.0–10.5)
nRBC: 0 % (ref 0.0–0.2)

## 2023-04-10 LAB — FERRITIN: Ferritin: 85 ng/mL (ref 11–307)

## 2023-04-10 LAB — IRON AND TIBC
Iron: 86 ug/dL (ref 28–170)
Saturation Ratios: 24 % (ref 10.4–31.8)
TIBC: 364 ug/dL (ref 250–450)
UIBC: 278 ug/dL

## 2023-04-10 LAB — RETIC PANEL
Immature Retic Fract: 4.7 % (ref 2.3–15.9)
RBC.: 5.2 MIL/uL — ABNORMAL HIGH (ref 3.87–5.11)
Retic Count, Absolute: 39 10*3/uL (ref 19.0–186.0)
Retic Ct Pct: 0.8 % (ref 0.4–3.1)
Reticulocyte Hemoglobin: 33.3 pg (ref >27.9)

## 2023-04-11 ENCOUNTER — Encounter: Payer: Self-pay | Admitting: Oncology

## 2023-04-11 ENCOUNTER — Inpatient Hospital Stay: Payer: 59 | Admitting: Oncology

## 2023-04-11 DIAGNOSIS — D508 Other iron deficiency anemias: Secondary | ICD-10-CM | POA: Diagnosis not present

## 2023-04-11 NOTE — Assessment & Plan Note (Addendum)
 Labs are reviewed and discussed with patient. Lab Results  Component Value Date   HGB 14.1 04/10/2023   TIBC 364 04/10/2023   IRONPCTSAT 24 04/10/2023   FERRITIN 85 04/10/2023    Hemoglobin and iron panel are both normalized. No need for additional IV venofer.

## 2023-04-11 NOTE — Progress Notes (Addendum)
 HEMATOLOGY-ONCOLOGY TeleHEALTH VISIT PROGRESS NOTE  I connected with Annette Phillips on 04/11/23  at  2:45 PM EST by video enabled telemedicine visit and verified that I am speaking with the correct person using two identifiers. I discussed the limitations, risks, security and privacy concerns of performing an evaluation and management service by telemedicine and the availability of in-person appointments. The patient expressed understanding and agreed to proceed.   Other persons participating in the visit and their role in the encounter:  None  Patient's location: office.  Provider's location: office Chief Complaint: IDA   INTERVAL HISTORY Annette Phillips is a 58 y.o. female who has above history reviewed by me today presents for follow up visit for management of iron deficiency anemia Problems and complaints are listed below:  Patient reports feeling well. Patient has had hemorrhoidectomy procedure done on 01/14/2023.  Since then no additional bleeding episodes.  She has felt so much better.  Review of Systems  Constitutional:  Negative for appetite change, chills, fatigue and fever.  HENT:   Negative for hearing loss and voice change.   Eyes:  Negative for eye problems.  Respiratory:  Negative for chest tightness and cough.   Cardiovascular:  Negative for chest pain.  Gastrointestinal:  Negative for abdominal distention, abdominal pain and blood in stool.  Endocrine: Negative for hot flashes.  Genitourinary:  Negative for difficulty urinating and frequency.   Musculoskeletal:  Negative for arthralgias.  Skin:  Negative for itching and rash.  Neurological:  Negative for extremity weakness.  Hematological:  Negative for adenopathy.  Psychiatric/Behavioral:  Negative for confusion.     Past Medical History:  Diagnosis Date   Allergy    Penicillin, Aspirin   Anemia July 2023   Blood transfusion without reported diagnosis July 2023   Goiter    Hypothyroid    Internal hemorrhoid,  bleeding 12/2022   Iron deficiency anemia    Pre-diabetes    Recurrent cold sores    Sinus tachycardia    Past Surgical History:  Procedure Laterality Date   APPENDECTOMY     BREAST BIOPSY Right 05/21/2009   benign   CHOLECYSTECTOMY N/A 02/28/2019   Procedure: LAPAROSCOPIC CHOLECYSTECTOMY;  Surgeon: Henrene Dodge, MD;  Location: [facility]S;  Service: General;  Laterality: N/A;   COLONOSCOPY WITH PROPOFOL N/A 09/14/2021   Procedure: COLONOSCOPY WITH PROPOFOL;  Surgeon: Midge Minium, MD;  Location: [facility];  Service: Endoscopy;  Laterality: N/A;   DE QUERVAIN'S RELEASE Right 06/2022   ESOPHAGOGASTRODUODENOSCOPY (EGD) WITH PROPOFOL N/A 02/27/2019   Procedure: ESOPHAGOGASTRODUODENOSCOPY (EGD) WITH PROPOFOL;  Surgeon: Toney Reil, MD;  Location: [facility] ENDOSCOPY;  Service: Gastroenterology;  Laterality: N/A;   ESOPHAGOGASTRODUODENOSCOPY (EGD) WITH PROPOFOL N/A 09/14/2021   Procedure: ESOPHAGOGASTRODUODENOSCOPY (EGD) WITH PROPOFOL;  Surgeon: Midge Minium, MD;  Location: [facility] ENDOSCOPY;  Service: Endoscopy;  Laterality: N/A;   GIVENS CAPSULE STUDY N/A 09/15/2021   Procedure: GIVENS CAPSULE STUDY;  Surgeon: Midge Minium, MD;  Location: [facility] ENDOSCOPY;  Service: Endoscopy;  Laterality: N/A;   HEMORRHOID BANDING  10/20/2022   HEMORRHOID SURGERY N/A 12/28/2022   Procedure: HEMORRHOIDECTOMY;  Surgeon: Carolan Shiver, MD;  Location: [facility];  Service: General;  Laterality: N/A;   KNEE ARTHROSCOPY Left 01/04/2007   torn meniscus   VITRECTOMY Right 09/2022    Family History  Problem Relation Age of Onset   Thyroid disease Mother    Diabetes Mellitus II Mother    Diabetes Mother    Thyroid disease Father    Healthy Sister  Hypothyroidism Daughter    Diabetes Son    Hypothyroidism Son    Diabetes Mellitus I Son    Kidney cancer Maternal Grandmother    Lung cancer Maternal Grandfather        smoker   Colon cancer Neg Hx    Breast cancer Neg Hx     Social History    Socioeconomic History   Marital status: Divorced    Spouse name: Not on file   Number of children: 3   Years of education: Not on file   Highest education level: Bachelor's degree (e.g., BA, AB, BS)  Occupational History   Not on file  Tobacco Use   Smoking status: Never   Smokeless tobacco: Never  Vaping Use   Vaping status: Never Used  Substance and Sexual Activity   Alcohol use: Not Currently   Drug use: Never   Sexual activity: Not Currently    Birth control/protection: Post-menopausal  Other Topics Concern   Not on file  Social History Narrative   Lives alone   Social Drivers of Health   Financial Resource Strain: Low Risk  (12/14/2022)   Overall Financial Resource Strain (CARDIA)    Difficulty of Paying Living Expenses: Not hard at all  Food Insecurity: No Food Insecurity (12/14/2022)   Hunger Vital Sign    Worried About Running Out of Food in the Last Year: Never true    Ran Out of Food in the Last Year: Never true  Transportation Needs: No Transportation Needs (12/14/2022)   PRAPARE - Administrator, Civil Service (Medical): No    Lack of Transportation (Non-Medical): No  Physical Activity: Insufficiently Active (12/14/2022)   Exercise Vital Sign    Days of Exercise per Week: 3 days    Minutes of Exercise per Session: 30 min  Stress: No Stress Concern Present (12/14/2022)   [employer] of Occupational Health - Occupational Stress Questionnaire    Feeling of Stress : Not at all  Social Connections: Moderately Integrated (12/14/2022)   Social Connection and Isolation Panel [NHANES]    Frequency of Communication with Friends and Family: More than three times a week    Frequency of Social Gatherings with Friends and Family: More than three times a week    Attends Religious Services: More than 4 times per year    Active Member of [employer] or Organizations: Yes    Attends Engineer, structural: More than 4 times per year    Marital Status:  Divorced  Catering manager Violence: Not on file    Current Outpatient Medications on File Prior to Visit  Medication Sig Dispense Refill   docusate sodium (COLACE) 100 MG capsule Take 200 mg by mouth daily.     Iron Sucrose (VENOFER IV) Inject into the vein once a week.     levothyroxine (SYNTHROID) 88 MCG tablet Take 1 tablet (88 mcg total) by mouth daily before breakfast. (Patient taking differently: Take 88 mcg by mouth every evening.) 90 tablet 1   Multiple Vitamin (MULTI-VITAMINS) TABS Take 1 tablet by mouth in the morning.     traMADol (ULTRAM) 50 MG tablet Take 1 tablet (50 mg total) by mouth every 6 (six) hours as needed. 10 tablet 0   No current facility-administered medications on file prior to visit.    Allergies  Allergen Reactions   Aspirin Other (See Comments)    Childhood Allergy    Penicillin G Other (See Comments)    Childhood Allergy  Observations/Objective: There were no vitals filed for this visit. There is no height or weight on file to calculate BMI.  Physical Exam Neurological:     Mental Status: She is alert.     CBC    Component Value Date/Time   WBC 5.3 04/10/2023 0815   WBC 4.4 12/05/2022 0804   RBC 5.20 (H) 04/10/2023 0815   RBC 5.01 04/10/2023 0815   HGB 14.1 04/10/2023 0815   HGB 8.7 (L) 11/26/2021 1336   HCT 42.2 04/10/2023 0815   HCT 29.4 (L) 11/26/2021 1336   PLT 236 04/10/2023 0815   PLT 331 11/26/2021 1336   MCV 84.2 04/10/2023 0815   MCV 86 11/26/2021 1336   MCH 28.1 04/10/2023 0815   MCHC 33.4 04/10/2023 0815   RDW 14.4 04/10/2023 0815   RDW 14.9 11/26/2021 1336   LYMPHSABS 1.2 04/10/2023 0815   LYMPHSABS 1.7 11/26/2021 1336   MONOABS 0.5 04/10/2023 0815   EOSABS 0.0 04/10/2023 0815   EOSABS 0.1 11/26/2021 1336   BASOSABS 0.0 04/10/2023 0815   BASOSABS 0.0 11/26/2021 1336    CMP     Component Value Date/Time   NA 134 (L) 09/19/2022 0811   NA 139 07/15/2022 0816   K 4.3 09/19/2022 0811   CL 105 09/19/2022 0811    CO2 25 09/19/2022 0811   GLUCOSE 107 (H) 09/19/2022 0811   BUN 24 (H) 09/19/2022 0811   BUN 20 07/15/2022 0816   CREATININE 0.65 09/19/2022 0811   CALCIUM 8.8 (L) 09/19/2022 0811   PROT 7.1 09/19/2022 0811   PROT 7.4 07/15/2022 0816   ALBUMIN 3.9 09/19/2022 0811   ALBUMIN 4.5 07/15/2022 0816   AST 19 09/19/2022 0811   ALT 22 09/19/2022 0811   ALKPHOS 50 09/19/2022 0811   BILITOT 0.2 (L) 09/19/2022 0811   GFRNONAA >60 09/19/2022 0811   GFRAA 114 07/16/2019 0945     ASSESSMENT & PLAN:   IDA (iron deficiency anemia) Labs are reviewed and discussed with patient. Lab Results  Component Value Date   HGB 14.1 04/10/2023   TIBC 364 04/10/2023   IRONPCTSAT 24 04/10/2023   FERRITIN 85 04/10/2023    Hemoglobin and iron panel are both normalized. No need for additional IV venofer.    Patient is discharged from my clinic. I recommend patient to continue follow up with primary care physician. Patient may re-establish care in the future if clinically indicated.  I discussed the assessment and treatment plan with the patient. The patient was provided an opportunity to ask questions and all were answered. The patient agreed with the plan and demonstrated an understanding of the instructions.  I provided 15 minutes of face-to-face video visit time during this encounter, and > 50% was spent counseling as documented under my assessment & plan.   Rickard Patience, MD 04/11/2023 9:11 PM

## 2023-06-07 ENCOUNTER — Other Ambulatory Visit: Payer: Self-pay | Admitting: Family Medicine

## 2023-06-07 DIAGNOSIS — R7989 Other specified abnormal findings of blood chemistry: Secondary | ICD-10-CM

## 2023-06-07 NOTE — Telephone Encounter (Signed)
 Requested Prescriptions  Pending Prescriptions Disp Refills   SYNTHROID  88 MCG tablet [Pharmacy Med Name: SYNTHROID  88 MCG TABLET] 90 tablet 1    Sig: TAKE 1 TABLET BY MOUTH DAILY BEFORE BREAKFAST.     Endocrinology:  Hypothyroid Agents Passed - 06/07/2023  2:22 PM      Passed - TSH in normal range and within 360 days    TSH  Date Value Ref Range Status  12/15/2022 0.887 0.450 - 4.500 uIU/mL Final         Passed - Valid encounter within last 12 months    Recent Outpatient Visits           2 months ago Flu-like symptoms   [place] [address] [facility] Lamon Pillow, MD       Future Appointments             In 1 month Bacigalupo, Stan Eans, MD [facility]

## 2023-07-21 ENCOUNTER — Encounter: Payer: Self-pay | Admitting: Family Medicine

## 2023-08-01 ENCOUNTER — Encounter: Payer: Self-pay | Admitting: Family Medicine

## 2023-08-01 ENCOUNTER — Ambulatory Visit (INDEPENDENT_AMBULATORY_CARE_PROVIDER_SITE_OTHER): Admitting: Family Medicine

## 2023-08-01 VITALS — BP 121/86 | HR 93 | Ht 67.5 in | Wt 160.2 lb

## 2023-08-01 DIAGNOSIS — Z1231 Encounter for screening mammogram for malignant neoplasm of breast: Secondary | ICD-10-CM

## 2023-08-01 DIAGNOSIS — R748 Abnormal levels of other serum enzymes: Secondary | ICD-10-CM

## 2023-08-01 DIAGNOSIS — Z833 Family history of diabetes mellitus: Secondary | ICD-10-CM

## 2023-08-01 DIAGNOSIS — E039 Hypothyroidism, unspecified: Secondary | ICD-10-CM

## 2023-08-01 DIAGNOSIS — Z0000 Encounter for general adult medical examination without abnormal findings: Secondary | ICD-10-CM

## 2023-08-01 NOTE — Assessment & Plan Note (Signed)
 Patient has a history of occasionally elevated LFTs without any jaundice or symptoms to report.  -continue to monitor and correlate with any clinically appreciable changes

## 2023-08-01 NOTE — Progress Notes (Signed)
 Complete physical exam  Patient: Annette Phillips   DOB: [birth year]   58 y.o. Female  MRN: [id number]  Subjective:    Chief Complaint  Patient presents with   Annual Exam    Last completed  Diet -  General, healthy Exercise - walking four to five days a week for fifty minutes to a hour Feeling - well Sleeping - well Concerns - none    Care Management    Zoster Vaccines - declined    Annette Phillips is a 58 y.o. female who presents today for a complete physical exam. She reports feeling quite well especially now that her anemia has resolved following hemorrhoidectomy back in January. She is enjoying spending time with her young grandchildren and is exercising multiple times per week. She has no additional concerns she wishes to address today. She denies any shortness of breath, changes to bowel or bladder habits, blood in urine or stool, or fatigue.  Most recent fall risk assessment:    08/01/2023    3:16 PM  Fall Risk   Falls in the past year? 0  Injury with Fall? 0  Risk for fall due to : No Fall Risks  Follow up Falls evaluation completed     Most recent depression screenings:    08/01/2023    3:16 PM 12/15/2022    8:53 AM  PHQ 2/9 Scores  PHQ - 2 Score 0 0    Past Medical History:  Diagnosis Date   Allergy    Penicillin, Aspirin   Anemia July 2023   Blood transfusion without reported diagnosis July 2023   Goiter    Hypothyroid    Internal hemorrhoid, bleeding 12/2022   Iron  deficiency anemia    Pre-diabetes    Recurrent cold sores    Sinus tachycardia    Past Surgical History:  Procedure Laterality Date   APPENDECTOMY     BREAST BIOPSY Right 05/21/2009   benign   CHOLECYSTECTOMY N/A 02/28/2019   Procedure: LAPAROSCOPIC CHOLECYSTECTOMY;  Surgeon: Emmalene Hare, MD;  Location: [facility] ORS;  Service: General;  Laterality: N/A;   COLONOSCOPY WITH PROPOFOL  N/A 09/14/2021   Procedure: COLONOSCOPY WITH PROPOFOL ;  Surgeon: Marnee Sink, MD;  Location: [facility] ENDOSCOPY;   Service: Endoscopy;  Laterality: N/A;   DE QUERVAIN'S RELEASE Right 06/2022   ESOPHAGOGASTRODUODENOSCOPY (EGD) WITH PROPOFOL  N/A 02/27/2019   Procedure: ESOPHAGOGASTRODUODENOSCOPY (EGD) WITH PROPOFOL ;  Surgeon: Selena Daily, MD;  Location: [facility] ENDOSCOPY;  Service: Gastroenterology;  Laterality: N/A;   ESOPHAGOGASTRODUODENOSCOPY (EGD) WITH PROPOFOL  N/A 09/14/2021   Procedure: ESOPHAGOGASTRODUODENOSCOPY (EGD) WITH PROPOFOL ;  Surgeon: Marnee Sink, MD;  Location: [facility] ENDOSCOPY;  Service: Endoscopy;  Laterality: N/A;   GIVENS CAPSULE STUDY N/A 09/15/2021   Procedure: GIVENS CAPSULE STUDY;  Surgeon: Marnee Sink, MD;  Location: [facility] ENDOSCOPY;  Service: Endoscopy;  Laterality: N/A;   HEMORRHOID BANDING  10/20/2022   HEMORRHOID SURGERY N/A 12/28/2022   Procedure: HEMORRHOIDECTOMY;  Surgeon: Eldred Grego, MD;  Location: [facility] ORS;  Service: General;  Laterality: N/A;   KNEE ARTHROSCOPY Left 01/04/2007   torn meniscus   VITRECTOMY Right 09/2022      Patient Care Team: Mazie Speed, MD as PCP - General (Family Medicine) Timmy Forbes, MD as Consulting Physician (Oncology)   Outpatient Medications Prior to Visit  Medication Sig   Multiple Vitamin (MULTI-VITAMINS) TABS Take 1 tablet by mouth in the morning.   SYNTHROID  88 MCG tablet TAKE 1 TABLET BY MOUTH DAILY BEFORE BREAKFAST.   docusate sodium  (COLACE) 100 MG capsule Take  200 mg by mouth daily.   Iron  Sucrose (VENOFER  IV) Inject into the vein once a week.   traMADol  (ULTRAM ) 50 MG tablet Take 1 tablet (50 mg total) by mouth every 6 (six) hours as needed.   No facility-administered medications prior to visit.       Objective:     BP 121/86 (BP Location: Right Arm, Patient Position: Sitting, Cuff Size: Normal)   Pulse 93   Ht 5' 7.5 (1.715 m)   Wt 160 lb 3.2 oz (72.7 kg)   SpO2 97%   BMI 24.72 kg/m  BP Readings from Last 3 Encounters:  08/01/23 121/86  12/28/22 (!) 136/93  12/27/22 126/88   Wt Readings from  Last 3 Encounters:  08/01/23 160 lb 3.2 oz (72.7 kg)  12/28/22 155 lb (70.3 kg)  12/20/22 158 lb (71.7 kg)   Physical Exam Constitutional:      General: She is not in acute distress.    Appearance: Normal appearance. She is normal weight.  HENT:     Head: Normocephalic and atraumatic.     Right Ear: External ear normal.     Left Ear: External ear normal.     Nose: Nose normal.     Mouth/Throat:     Mouth: Mucous membranes are moist.     Pharynx: Oropharynx is clear.   Eyes:     Extraocular Movements: Extraocular movements intact.     Conjunctiva/sclera: Conjunctivae normal.     Pupils: Pupils are equal, round, and reactive to light.    Cardiovascular:     Rate and Rhythm: Normal rate and regular rhythm.     Pulses: Normal pulses.     Heart sounds: No murmur heard.    No friction rub. No gallop.  Pulmonary:     Effort: Pulmonary effort is normal. No respiratory distress.     Breath sounds: Normal breath sounds. No wheezing or rales.  Abdominal:     General: Abdomen is flat. There is no distension.     Palpations: Abdomen is soft.     Tenderness: There is no abdominal tenderness.   Musculoskeletal:        General: Normal range of motion.     Cervical back: Normal range of motion.     Right lower leg: No edema.     Left lower leg: No edema.   Skin:    General: Skin is warm and dry.     Coloration: Skin is not jaundiced or pale.   Neurological:     General: No focal deficit present.     Mental Status: She is alert and oriented to person, place, and time. Mental status is at baseline.     Cranial Nerves: No cranial nerve deficit.     Motor: No weakness.   Psychiatric:        Mood and Affect: Mood normal.        Behavior: Behavior normal.      No results found for any visits on 08/01/23. Last CBC Lab Results  Component Value Date   WBC 5.3 04/10/2023   HGB 14.1 04/10/2023   HCT 42.2 04/10/2023   MCV 84.2 04/10/2023   MCH 28.1 04/10/2023   RDW 14.4  04/10/2023   PLT 236 04/10/2023   Last metabolic panel Lab Results  Component Value Date   GLUCOSE 107 (H) 09/19/2022   NA 134 (L) 09/19/2022   K 4.3 09/19/2022   CL 105 09/19/2022   CO2 25 09/19/2022   BUN 24 (  H) 09/19/2022   CREATININE 0.65 09/19/2022   GFRNONAA >60 09/19/2022   CALCIUM 8.8 (L) 09/19/2022   PROT 7.1 09/19/2022   ALBUMIN 3.9 09/19/2022   LABGLOB 2.9 07/15/2022   AGRATIO 1.6 07/15/2022   BILITOT 0.2 (L) 09/19/2022   ALKPHOS 50 09/19/2022   AST 19 09/19/2022   ALT 22 09/19/2022   ANIONGAP 4 (L) 09/19/2022   Last lipids Lab Results  Component Value Date   CHOL 163 07/15/2022   HDL 41 07/15/2022   LDLCALC 98 07/15/2022   TRIG 137 07/15/2022   CHOLHDL 4.0 07/16/2019   Last hemoglobin A1c Lab Results  Component Value Date   HGBA1C 5.3 07/15/2022   Last thyroid  functions Lab Results  Component Value Date   TSH 0.887 12/15/2022        Assessment & Plan:    Routine Health Maintenance and Physical Exam  Immunization History  Administered Date(s) Administered   Td 01/30/2019    Health Maintenance  Topic Date Due   Zoster Vaccines- Shingrix (1 of 2) Never done   COVID-19 Vaccine (1 - 2024-25 season) Never done   MAMMOGRAM  09/07/2023   INFLUENZA VACCINE  09/15/2023   Cervical Cancer Screening (HPV/Pap Cotest)  07/23/2026   DTaP/Tdap/Td (2 - Tdap) 01/29/2029   Colonoscopy  09/15/2031   Hepatitis C Screening  Completed   HIV Screening  Completed   HPV VACCINES  Aged Out   Meningococcal B Vaccine  Aged Out    Discussed health benefits of physical activity, and encouraged her to engage in regular exercise appropriate for her age and condition.  Problem List Items Addressed This Visit       Endocrine   Hypothyroid   Previously overcorrected thyroid  levels with anemia now having been resolved. Patient feels well and has not noticed any hyperthyroid symptoms. -Check thyroid  levels today. -Reassess current synthroid  dosage based on labs          Relevant Orders   TSH   Lipid Panel With LDL/HDL Ratio     Other   Family history of diabetes mellitus   Continue to monitor annual A1c      Relevant Orders   HgB A1c   Lipid Panel With LDL/HDL Ratio   Elevated liver enzymes   Patient has a history of occasionally elevated LFTs without any jaundice or symptoms to report.  -continue to monitor and correlate with any clinically appreciable changes      Relevant Orders   Lipid Panel With LDL/HDL Ratio   Comprehensive Metabolic Panel (CMET)   Other Visit Diagnoses       Encounter for annual physical exam    -  Primary   Relevant Orders   HgB A1c   TSH   Lipid Panel With LDL/HDL Ratio   Comprehensive Metabolic Panel (CMET)     Breast cancer screening by mammogram       Relevant Orders   MM 3D SCREENING MAMMOGRAM BILATERAL BREAST      Return in about 1 year (around 07/31/2024) for CPE.     Aden Agreste, MD   Patient seen along with MS3 student, Annette Phillips. I personally evaluated this patient along with the student, and verified all aspects of the history, physical exam, and medical decision making as documented by the student. I agree with the student's documentation and have made all necessary edits.  Marlissa Emerick, Stan Eans, MD, MPH [facility]

## 2023-08-01 NOTE — Assessment & Plan Note (Signed)
Continue to monitor annual A1c

## 2023-08-01 NOTE — Assessment & Plan Note (Signed)
 Previously overcorrected thyroid  levels with anemia now having been resolved. Patient feels well and has not noticed any hyperthyroid symptoms. -Check thyroid  levels today. -Reassess current synthroid  dosage based on labs

## 2023-08-02 LAB — COMPREHENSIVE METABOLIC PANEL WITH GFR
ALT: 29 IU/L (ref 0–32)
AST: 23 IU/L (ref 0–40)
Albumin: 4.5 g/dL (ref 3.8–4.9)
Alkaline Phosphatase: 69 IU/L (ref 44–121)
BUN/Creatinine Ratio: 31 — ABNORMAL HIGH (ref 9–23)
BUN: 19 mg/dL (ref 6–24)
Bilirubin Total: 0.3 mg/dL (ref 0.0–1.2)
CO2: 21 mmol/L (ref 20–29)
Calcium: 9.8 mg/dL (ref 8.7–10.2)
Chloride: 99 mmol/L (ref 96–106)
Creatinine, Ser: 0.61 mg/dL (ref 0.57–1.00)
Globulin, Total: 3.2 g/dL (ref 1.5–4.5)
Glucose: 99 mg/dL (ref 70–99)
Potassium: 4.1 mmol/L (ref 3.5–5.2)
Sodium: 136 mmol/L (ref 134–144)
Total Protein: 7.7 g/dL (ref 6.0–8.5)
eGFR: 104 mL/min/{1.73_m2} (ref >59)

## 2023-08-02 LAB — LIPID PANEL WITH LDL/HDL RATIO
Cholesterol, Total: 186 mg/dL (ref 100–199)
HDL: 35 mg/dL — ABNORMAL LOW (ref >39)
LDL Chol Calc (NIH): 79 mg/dL (ref 0–99)
LDL/HDL Ratio: 2.3 ratio (ref 0.0–3.2)
Triglycerides: 448 mg/dL — ABNORMAL HIGH (ref 0–149)
VLDL Cholesterol Cal: 72 mg/dL — ABNORMAL HIGH (ref 5–40)

## 2023-08-02 LAB — TSH: TSH: 1.29 u[IU]/mL (ref 0.450–4.500)

## 2023-08-02 LAB — HEMOGLOBIN A1C
Est. average glucose Bld gHb Est-mCnc: 120 mg/dL
Hgb A1c MFr Bld: 5.8 % — ABNORMAL HIGH (ref 4.8–5.6)

## 2023-08-03 ENCOUNTER — Encounter: Payer: Self-pay | Admitting: Family Medicine

## 2023-08-04 ENCOUNTER — Ambulatory Visit: Payer: Self-pay | Admitting: Family Medicine

## 2023-08-04 NOTE — Telephone Encounter (Signed)
 See result note.

## 2023-08-04 NOTE — Telephone Encounter (Signed)
 Please see the message from the pt below and advise

## 2023-09-14 ENCOUNTER — Ambulatory Visit
Admission: RE | Admit: 2023-09-14 | Discharge: 2023-09-14 | Disposition: A | Discharge status: Home / Self Care | Source: Ambulatory Visit | Attending: Family Medicine | Admitting: Family Medicine

## 2023-09-14 DIAGNOSIS — Z1231 Encounter for screening mammogram for malignant neoplasm of breast: Secondary | ICD-10-CM | POA: Diagnosis present

## 2023-09-18 ENCOUNTER — Other Ambulatory Visit: Payer: Self-pay | Admitting: Family Medicine

## 2023-09-18 DIAGNOSIS — R928 Other abnormal and inconclusive findings on diagnostic imaging of breast: Secondary | ICD-10-CM

## 2023-09-22 ENCOUNTER — Ambulatory Visit
Admission: RE | Admit: 2023-09-22 | Discharge: 2023-09-22 | Disposition: A | Discharge status: Home / Self Care | Source: Ambulatory Visit | Attending: Family Medicine | Admitting: Family Medicine

## 2023-09-22 DIAGNOSIS — R928 Other abnormal and inconclusive findings on diagnostic imaging of breast: Secondary | ICD-10-CM

## 2023-09-25 ENCOUNTER — Other Ambulatory Visit: Payer: Self-pay | Admitting: Family Medicine

## 2023-09-25 DIAGNOSIS — R928 Other abnormal and inconclusive findings on diagnostic imaging of breast: Secondary | ICD-10-CM

## 2023-09-28 ENCOUNTER — Ambulatory Visit
Admission: RE | Admit: 2023-09-28 | Discharge: 2023-09-28 | Disposition: A | Discharge status: Home / Self Care | Source: Ambulatory Visit | Attending: Family Medicine | Admitting: Family Medicine

## 2023-09-28 DIAGNOSIS — R928 Other abnormal and inconclusive findings on diagnostic imaging of breast: Secondary | ICD-10-CM | POA: Diagnosis present

## 2023-09-28 DIAGNOSIS — C50211 Malignant neoplasm of upper-inner quadrant of right female breast: Secondary | ICD-10-CM | POA: Insufficient documentation

## 2023-09-28 HISTORY — PX: BREAST BIOPSY: SHX20

## 2023-09-28 MED ORDER — LIDOCAINE 1 % OPTIME INJ - NO CHARGE
2.0000 mL | Freq: Once | INTRAMUSCULAR | Status: AC
  Administered 2023-09-28: 2 mL
  Filled 2023-09-28: qty 2

## 2023-09-28 MED ORDER — LIDOCAINE-EPINEPHRINE 1 %-1:100000 IJ SOLN
8.0000 mL | Freq: Once | INTRAMUSCULAR | Status: AC
  Administered 2023-09-28: 8 mL
  Filled 2023-09-28: qty 8

## 2023-09-29 LAB — SURGICAL PATHOLOGY

## 2023-10-02 ENCOUNTER — Encounter: Payer: Self-pay | Admitting: *Deleted

## 2023-10-02 NOTE — Progress Notes (Signed)
 Received referral for newly diagnosed breast cancer from [facility].  Navigation initiated.  Attempted to call patient, no answer, VM left asking for return call.

## 2023-10-02 NOTE — Progress Notes (Signed)
 Annette Phillips returned my call and will see Dr. Babara on Friday 8/29 at 11:15 (per her request).  She does not want to schedule surgeon appt. Until after she meets with Dr. Babara.

## 2023-10-11 ENCOUNTER — Ambulatory Visit: Admitting: Oncology

## 2023-10-13 ENCOUNTER — Inpatient Hospital Stay: Attending: Oncology | Admitting: Oncology

## 2023-10-13 ENCOUNTER — Inpatient Hospital Stay

## 2023-10-13 ENCOUNTER — Encounter: Payer: Self-pay | Admitting: Oncology

## 2023-10-13 ENCOUNTER — Encounter: Payer: Self-pay | Admitting: *Deleted

## 2023-10-13 VITALS — BP 137/91 | HR 79 | Temp 97.0°F | Resp 18 | Ht 67.5 in | Wt 160.4 lb

## 2023-10-13 DIAGNOSIS — C50911 Malignant neoplasm of unspecified site of right female breast: Secondary | ICD-10-CM | POA: Diagnosis not present

## 2023-10-13 DIAGNOSIS — Z1722 Progesterone receptor negative status: Secondary | ICD-10-CM | POA: Diagnosis not present

## 2023-10-13 DIAGNOSIS — C50919 Malignant neoplasm of unspecified site of unspecified female breast: Secondary | ICD-10-CM | POA: Diagnosis not present

## 2023-10-13 DIAGNOSIS — Z1731 Human epidermal growth factor receptor 2 positive status: Secondary | ICD-10-CM | POA: Diagnosis not present

## 2023-10-13 DIAGNOSIS — Z801 Family history of malignant neoplasm of trachea, bronchus and lung: Secondary | ICD-10-CM | POA: Diagnosis not present

## 2023-10-13 DIAGNOSIS — Z809 Family history of malignant neoplasm, unspecified: Secondary | ICD-10-CM

## 2023-10-13 DIAGNOSIS — D509 Iron deficiency anemia, unspecified: Secondary | ICD-10-CM | POA: Diagnosis not present

## 2023-10-13 DIAGNOSIS — Z8051 Family history of malignant neoplasm of kidney: Secondary | ICD-10-CM | POA: Diagnosis not present

## 2023-10-13 DIAGNOSIS — D508 Other iron deficiency anemias: Secondary | ICD-10-CM

## 2023-10-13 DIAGNOSIS — Z171 Estrogen receptor negative status [ER-]: Secondary | ICD-10-CM | POA: Insufficient documentation

## 2023-10-13 LAB — CBC WITH DIFFERENTIAL/PLATELET
Abs Immature Granulocytes: 0.01 K/uL (ref 0.00–0.07)
Basophils Absolute: 0 K/uL (ref 0.0–0.1)
Basophils Relative: 0 %
Eosinophils Absolute: 0.1 K/uL (ref 0.0–0.5)
Eosinophils Relative: 1 %
HCT: 40.5 % (ref 36.0–46.0)
Hemoglobin: 13.7 g/dL (ref 12.0–15.0)
Immature Granulocytes: 0 %
Lymphocytes Relative: 30 %
Lymphs Abs: 1.7 K/uL (ref 0.7–4.0)
MCH: 29.8 pg (ref 26.0–34.0)
MCHC: 33.8 g/dL (ref 30.0–36.0)
MCV: 88.2 fL (ref 80.0–100.0)
Monocytes Absolute: 0.6 K/uL (ref 0.1–1.0)
Monocytes Relative: 11 %
Neutro Abs: 3.3 K/uL (ref 1.7–7.7)
Neutrophils Relative %: 58 %
Platelets: 205 K/uL (ref 150–400)
RBC: 4.59 MIL/uL (ref 3.87–5.11)
RDW: 12.9 % (ref 11.5–15.5)
WBC: 5.7 K/uL (ref 4.0–10.5)
nRBC: 0 % (ref 0.0–0.2)

## 2023-10-13 LAB — COMPREHENSIVE METABOLIC PANEL WITH GFR
ALT: 27 U/L (ref 0–44)
AST: 23 U/L (ref 15–41)
Albumin: 4.4 g/dL (ref 3.5–5.0)
Alkaline Phosphatase: 52 U/L (ref 38–126)
Anion gap: 7 (ref 5–15)
BUN: 18 mg/dL (ref 6–20)
CO2: 25 mmol/L (ref 22–32)
Calcium: 9.6 mg/dL (ref 8.9–10.3)
Chloride: 100 mmol/L (ref 98–111)
Creatinine, Ser: 0.78 mg/dL (ref 0.44–1.00)
GFR, Estimated: >60 mL/min (ref >60)
Glucose, Bld: 96 mg/dL (ref 70–99)
Potassium: 4.2 mmol/L (ref 3.5–5.1)
Sodium: 132 mmol/L — ABNORMAL LOW (ref 135–145)
Total Bilirubin: 1 mg/dL (ref 0.0–1.2)
Total Protein: 8.2 g/dL — ABNORMAL HIGH (ref 6.5–8.1)

## 2023-10-13 NOTE — Assessment & Plan Note (Addendum)
 Pathology and radiology counseling: Discussed with the patient, the details of pathology including the type of breast cancer,the clinical staging, the significance of ER, PR and HER-2/neu receptors and the implications for treatment. After reviewing the pathology in detail, we proceeded to discuss the different treatment options between surgery, radiation, chemotherapy, antiestrogen therapies.  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  2 Stage I HER2 positive breast caner, recommend upfront surgery. She will further discuss with surgeon regarding surgery preference. Refer to surgery 3 Adjuvant chemotherapy with Taxol and Trastuzumab. Echo evaluation.  4 Adjuvant radiation if she gets lumpectomy  I discussed with patient that if MRI breast showed additional area of concern, she may need biopsy for further discussion. If breast cancer size is bigger in MRI, she may need neoadjuvant chemotherapy.

## 2023-10-13 NOTE — Assessment & Plan Note (Signed)
 Labs are reviewed and discussed with patient. Lab Results  Component Value Date   HGB 13.7 10/13/2023   TIBC 364 04/10/2023   IRONPCTSAT 24 04/10/2023   FERRITIN 85 04/10/2023    Hemoglobin is stable.  No need for additional IV venofer .

## 2023-10-13 NOTE — Progress Notes (Signed)
 Accompanied patient and family to initial medical oncology appointment.   Reviewed Breast Cancer treatment handbook.   Care plan summary given to patient.   Reviewed outreach programs and cancer center services.  Annette Phillips does not want genetics or OT referrals at this time.

## 2023-10-13 NOTE — Assessment & Plan Note (Signed)
 Discussed about genetic counseling. She would like to defer.

## 2023-10-13 NOTE — Progress Notes (Signed)
 Hematology/Oncology Progress note [phone] [phone]       REFERRING PROVIDER: Myrla Jon HERO, MD  CHIEF COMPLAINTS/REASON FOR VISIT:  Iron  deficiency anemia. HER2 positive breast cancer.  ASSESSMENT & PLAN:   Cancer Staging  Invasive carcinoma of breast (HCC) Staging form: Breast, AJCC 8th Edition - Clinical stage from 10/13/2023: Stage IA (cT1b, cN0, cM0, G3, ER-, PR-, HER2+) - Signed by [name], MD on 10/13/2023   Invasive carcinoma of breast [facility]) Pathology and radiology counseling: Discussed with the patient, the details of pathology including the type of breast cancer,the clinical staging, the significance of ER, PR and HER-2/neu receptors and the implications for treatment. After reviewing the pathology in detail, we proceeded to discuss the different treatment options between surgery, radiation, chemotherapy, antiestrogen therapies.  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  2 Stage I HER2 positive breast caner, recommend upfront surgery. She will further discuss with surgeon regarding surgery preference. Refer to surgery 3 Adjuvant chemotherapy with Taxol and Trastuzumab. Echo evaluation.  4 Adjuvant radiation if she gets lumpectomy  I discussed with patient that if MRI breast showed additional area of concern, she may need biopsy for further discussion. If breast cancer size is bigger in MRI, she may need neoadjuvant chemotherapy.    IDA (iron  deficiency anemia) Labs are reviewed and discussed with patient. Lab Results  Component Value Date   HGB 13.7 10/13/2023   TIBC 364 04/10/2023   IRONPCTSAT 24 04/10/2023   FERRITIN 85 04/10/2023    Hemoglobin is stable.  No need for additional IV venofer .      Family history of cancer Discussed about genetic counseling. She would like to defer.      Orders Placed This Encounter   Procedures   MR BREAST BILATERAL W WO CONTRAST INC CAD    Standing Status:   Future    Expected Date:   10/20/2023    Expiration Date:   10/12/2024    If indicated for the ordered procedure, I authorize the administration of contrast media per Radiology protocol:   Yes    What is the patient's sedation requirement?:   No Sedation    Does the patient have a pacemaker or implanted devices?:   No    Radiology Contrast Protocol - do NOT remove file path:   [email or web address]    Preferred imaging location?:   [facility] (table limit - 500lbs)   Comprehensive metabolic panel with GFR    Standing Status:   Future    Number of Occurrences:   1    Expected Date:   10/13/2023    Expiration Date:   01/11/2024   CBC with Differential/Platelet    Standing Status:   Future    Number of Occurrences:   1    Expected Date:   10/13/2023    Expiration Date:   01/11/2024   Cancer antigen 15-3    Standing Status:   Future    Number of Occurrences:   1    Expected Date:   10/13/2023    Expiration Date:   01/11/2024   Cancer antigen 27.29    Standing Status:   Future    Number of Occurrences:   1    Expected Date:   10/13/2023    Expiration Date:   01/11/2024   Ambulatory referral to General Surgery    Referral Priority:   Routine  Referral Type:   Surgical    Referral Reason:   Specialty Services Required    Requested Specialty:   General Surgery    Number of Visits Requested:   1   Follow up TBD All questions were answered. The patient knows to call the clinic with any problems, questions or concerns.  Zelphia Cap, MD, PhD [facility] Hematology Oncology 10/13/2023     HISTORY OF PRESENTING ILLNESS:  Annette Phillips is a  58 y.o.  female with PMH listed below who presents for follow up   History of iron  deficiency anemia.  09/14/21 EGD: Small hiatal hernia, nonobstructing Schatzki ring, stomach and duodenum normal.  Colonoscopy: Bleeding hemorrhoids  grade 3.  10/08/21 capsule study negative.  S/p hemorrhoid banding in Sept 2024, recently started to have rectal bleeding.   + fatigue. + right side abdomen discomfort, intermittently. Recent US  showed no acute finding. Fatty liver disease. History of cholecystectomy.  She denies recent chest pain on exertion, pre-syncopal episodes, or palpitations She works as Careers adviser  Oncology History  Invasive carcinoma of breast (HCC)  09/18/2023 Mammogram   Bilateral screening mammogram  In the right breast, possible asymmetries warrant further evaluation. In the left breast, no findings suspicious for malignancy.   09/22/2023 Mammogram   Right breast diagnostic mammogram   1. There is a highly suspicious 10 mm mass in the RIGHT upper inner breast. Recommend ultrasound-guided biopsy for definitive characterization. 2. There is a sonographically identified additional 6 mm mass in the RIGHT upper inner breast which is indeterminate. Recommend ultrasound-guided biopsy for definitive characterization. 3. No suspicious RIGHT axillary adenopathy.   09/28/2023 Initial Diagnosis   Invasive carcinoma of breast (HCC)  S/p right breast biopsy.   1. Breast, right, needle core biopsy, 2:00 6cmfn 10mm (ribbon clip) - INVASIVE DUCTAL CARCINOMA, SEE NOTE - TUBULE FORMATION: SCORE 3 - NUCLEAR PLEOMORPHISM: SCORE 3 - MITOTIC COUNT: SCORE 3 - TOTAL SCORE: 9 - OVERALL GRADE: 3 - LYMPHOVASCULAR INVASION: NOT IDENTIFIED - CANCER LENGTH: 0.7 CM - CALCIFICATIONS: NOT IDENTIFIED - OTHER FINDINGS: NONE 2. Breast, right, needle core biopsy, 1:00 3cmfn 6mm (venus clip) - INVASIVE DUCTAL CARCINOMA, SEE NOTE - TUBULE FORMATION: SCORE 3 - NUCLEAR PLEOMORPHISM: SCORE 3 - MITOTIC COUNT: SCORE 3 - TOTAL SCORE: 9 - OVERALL GRADE: 3 - LYMPHOVASCULAR INVASION: NOT IDENTIFIED - CANCER LENGTH: 0.5 CM - CALCIFICATIONS: NOT IDENTIFIED - OTHER FINDINGS: NONE  1. 1) Breast, right, needle  core biopsy, 2:00 6 cmfn 10mm ( ribbon clip) PROGNOSTIC INDICATORS Results: IMMUNOHISTOCHEMICAL AND MORPHOMETRIC ANALYSIS PERFORMED MANUALLY The tumor cells are POSITIVE for Her2 (3+). Estrogen Receptor: 0%, NEGATIVE Progesterone Receptor: 0%, NEGATIVE Proliferation Marker Ki67: 50%     10/13/2023 Cancer Staging   Staging form: Breast, AJCC 8th Edition - Clinical stage from 10/13/2023: Stage IA (cT1b, cN0, cM0, G3, ER-, PR-, HER2+) - Signed by Cap Zelphia, MD on 10/13/2023 Stage prefix: Initial diagnosis Histologic grading system: 3 grade system    Patient presents to discuss management plan. She has family history of lung cancer and kidney cancer.    MEDICAL HISTORY:  Past Medical History:  Diagnosis Date   Allergy    Penicillin, Aspirin   Anemia July 2023   Blood transfusion without reported diagnosis July 2023   Goiter    Hypothyroid    Internal hemorrhoid, bleeding 12/2022   Iron  deficiency anemia    Pre-diabetes    Recurrent cold sores    Sinus tachycardia     SURGICAL HISTORY:  Past Surgical History:  Procedure Laterality Date   APPENDECTOMY     BREAST BIOPSY Right 05/21/2009   benign   BREAST BIOPSY Right 09/28/2023   US  RT BREAST BX W LOC DEV 1ST LESION IMG BX SPEC US  GUIDE 09/28/2023 [facility]   BREAST BIOPSY Right 09/28/2023   US  RT BREAST BX W LOC DEV EA ADD LESION IMG BX SPEC US  GUIDE 09/28/2023 [facility]   CHOLECYSTECTOMY N/A 02/28/2019   Procedure: LAPAROSCOPIC CHOLECYSTECTOMY;  Surgeon: Desiderio Schanz, MD;  Location: [facility] OR[facility];  Service: General;  Laterality: N/A;   COLONOSCOPY WITH PROPOFOL  N/A 09/14/2021   Procedure: COLONOSCOPY WITH PROPOFOL ;  Surgeon: Jinny Carmine, MD;  Location: [facility] ENDOSCOPY;  Service: Endoscopy;  Laterality: N/A;   DE QUERVAIN'S RELEASE Right 06/2022   ESOPHAGOGASTRODUODENOSCOPY (EGD) WITH PROPOFOL  N/A 02/27/2019   Procedure: ESOPHAGOGASTRODUODENOSCOPY (EGD) WITH PROPOFOL ;  Surgeon: Unk Corinn Skiff, MD;  Location: [facility]  ENDOSCOPY;  Service: Gastroenterology;  Laterality: N/A;   ESOPHAGOGASTRODUODENOSCOPY (EGD) WITH PROPOFOL  N/A 09/14/2021   Procedure: ESOPHAGOGASTRODUODENOSCOPY (EGD) WITH PROPOFOL ;  Surgeon: Jinny Carmine, MD;  Location: [facility] ENDOSCOPY;  Service: Endoscopy;  Laterality: N/A;   GIVENS CAPSULE STUDY N/A 09/15/2021   Procedure: GIVENS CAPSULE STUDY;  Surgeon: Jinny Carmine, MD;  Location: [facility] ENDOSCOPY;  Service: Endoscopy;  Laterality: N/A;   HEMORRHOID BANDING  10/20/2022   HEMORRHOID SURGERY N/A 12/28/2022   Procedure: HEMORRHOIDECTOMY;  Surgeon: Rodolph Romano, MD;  Location: [facility] ORS;  Service: General;  Laterality: N/A;   KNEE ARTHROSCOPY Left 01/04/2007   torn meniscus   VITRECTOMY Right 09/2022    SOCIAL HISTORY: Social History   Socioeconomic History   Marital status: Divorced    Spouse name: Not on file   Number of children: 3   Years of education: Not on file   Highest education level: Bachelor's degree (e.g., BA, AB, BS)  Occupational History   Not on file  Tobacco Use   Smoking status: Never   Smokeless tobacco: Never  Vaping Use   Vaping status: Never Used  Substance and Sexual Activity   Alcohol use: Never   Drug use: Never   Sexual activity: Not Currently    Birth control/protection: Abstinence, Post-menopausal  Other Topics Concern   Not on file  Social History Narrative   Lives alone   Social Drivers of Health   Financial Resource Strain: Low Risk  (07/31/2023)   Overall Financial Resource Strain (CARDIA)    Difficulty of Paying Living Expenses: Not hard at all  Food Insecurity: No Food Insecurity (07/31/2023)   Hunger Vital Sign    Worried About Running Out of Food in the Last Year: Never true    Ran Out of Food in the Last Year: Never true  Transportation Needs: No Transportation Needs (07/31/2023)   PRAPARE - Administrator, Civil Service (Medical): No    Lack of Transportation (Non-Medical): No  Physical Activity: Sufficiently  Active (07/31/2023)   Exercise Vital Sign    Days of Exercise per Week: 4 days    Minutes of Exercise per Session: 50 min  Stress: No Stress Concern Present (07/31/2023)   [employer] of Occupational Health - Occupational Stress Questionnaire    Feeling of Stress: Not at all  Social Connections: Moderately Integrated (07/31/2023)   Social Connection and Isolation Panel    Frequency of Communication with Friends and Family: More than three times a week    Frequency of Social Gatherings with Friends and Family: More than three times a week  Attends Religious Services: More than 4 times per year    Active Member of Clubs or Organizations: Yes    Attends Engineer, structural: More than 4 times per year    Marital Status: Divorced  Catering manager Violence: Not on file    FAMILY HISTORY: Family History  Problem Relation Age of Onset   Thyroid  disease Mother    Diabetes Mellitus II Mother    Diabetes Mother    Thyroid  disease Father    Healthy Sister    Hypothyroidism Daughter    Diabetes Son    Hypothyroidism Son    Diabetes Mellitus I Son    Kidney cancer Maternal Grandmother    Lung cancer Maternal Grandfather        smoker   Colon cancer Neg Hx    Breast cancer Neg Hx     ALLERGIES:  is allergic to aspirin and penicillin g.  MEDICATIONS:  Current Outpatient Medications  Medication Sig Dispense Refill   Multiple Vitamin (MULTI-VITAMINS) TABS Take 1 tablet by mouth in the morning.     psyllium (REGULOID) 0.52 g capsule Take 0.52 g by mouth daily.     SYNTHROID  88 MCG tablet TAKE 1 TABLET BY MOUTH DAILY BEFORE BREAKFAST. 90 tablet 1   docusate sodium  (COLACE) 100 MG capsule Take 200 mg by mouth daily.     Iron  Sucrose (VENOFER  IV) Inject into the vein once a week.     traMADol  (ULTRAM ) 50 MG tablet Take 1 tablet (50 mg total) by mouth every 6 (six) hours as needed. 10 tablet 0   No current facility-administered medications for this visit.    Review of  Systems  Constitutional:  Positive for fatigue. Negative for appetite change, chills and fever.  HENT:   Negative for hearing loss and voice change.   Eyes:  Negative for eye problems.  Respiratory:  Negative for chest tightness and cough.   Cardiovascular:  Negative for chest pain.  Gastrointestinal:  Negative for abdominal distention and abdominal pain.  Endocrine: Negative for hot flashes.  Genitourinary:  Negative for difficulty urinating and frequency.   Musculoskeletal:  Negative for arthralgias.  Skin:  Negative for itching and rash.  Neurological:  Negative for extremity weakness.  Hematological:  Negative for adenopathy.  Psychiatric/Behavioral:  Negative for confusion.     PHYSICAL EXAMINATION: ECOG PERFORMANCE STATUS: 1 - Symptomatic but completely ambulatory Vitals:   10/13/23 1121  BP: (!) 137/91  Pulse: 79  Resp: 18  Temp: (!) 97 F (36.1 C)  SpO2: 97%   Filed Weights   10/13/23 1121  Weight: 160 lb 6.4 oz (72.8 kg)    Physical Exam Constitutional:      General: She is not in acute distress. HENT:     Head: Normocephalic and atraumatic.  Eyes:     General: No scleral icterus. Cardiovascular:     Rate and Rhythm: Normal rate and regular rhythm.  Pulmonary:     Effort: Pulmonary effort is normal. No respiratory distress.     Breath sounds: No wheezing.  Abdominal:     General: Bowel sounds are normal. There is no distension.     Palpations: Abdomen is soft.  Musculoskeletal:        General: Normal range of motion.     Cervical back: Normal range of motion and neck supple.  Skin:    General: Skin is warm and dry.     Findings: No erythema or rash.  Neurological:     Mental  Status: She is alert and oriented to person, place, and time. Mental status is at baseline.     Cranial Nerves: No cranial nerve deficit.  Psychiatric:        Mood and Affect: Mood normal.    Breast exam was performed in seated and lying down position. Patient is status post  right breast biopsy with focal bruising.  No palpable breast masses bilaterally.  No palpable axillary adenopathy bilaterally.   LABORATORY DATA:  I have reviewed the data as listed    Latest Ref Rng & Units 10/13/2023   12:26 PM 04/10/2023    8:15 AM 12/05/2022    8:04 AM  CBC  WBC 4.0 - 10.5 K/uL 5.7  5.3  4.4   Hemoglobin 12.0 - 15.0 g/dL 86.2  85.8  89.1   Hematocrit 36.0 - 46.0 % 40.5  42.2  35.7   Platelets 150 - 400 K/uL 205  236  220       Latest Ref Rng & Units 10/13/2023   12:26 PM 08/01/2023   12:00 AM 09/19/2022    8:11 AM  CMP  Glucose 70 - 99 mg/dL 96  99  892   BUN 6 - 20 mg/dL 18  19  24    Creatinine 0.44 - 1.00 mg/dL 9.21  9.38  9.34   Sodium 135 - 145 mmol/L 132  136  134   Potassium 3.5 - 5.1 mmol/L 4.2  4.1  4.3   Chloride 98 - 111 mmol/L 100  99  105   CO2 22 - 32 mmol/L 25  21  25    Calcium 8.9 - 10.3 mg/dL 9.6  9.8  8.8   Total Protein 6.5 - 8.1 g/dL 8.2  7.7  7.1   Total Bilirubin 0.0 - 1.2 mg/dL 1.0  0.3  0.2   Alkaline Phos 38 - 126 U/L 52  69  50   AST 15 - 41 U/L 23  23  19    ALT 0 - 44 U/L 27  29  22        Component Value Date/Time   IRON  86 04/10/2023 0815   IRON  26 (L) 11/26/2021 1336   TIBC 364 04/10/2023 0815   TIBC 424 11/26/2021 1336   FERRITIN 85 04/10/2023 0815   FERRITIN 11 (L) 11/26/2021 1336   IRONPCTSAT 24 04/10/2023 0815   IRONPCTSAT 6 (LL) 11/26/2021 1336     RADIOGRAPHIC STUDIES: I have personally reviewed the radiological images as listed and agreed with the findings in the report. US  RT BREAST BX W LOC DEV 1ST LESION IMG BX SPEC US  GUIDE Addendum Date: 10/02/2023 ADDENDUM REPORT: 10/02/2023 10:33 ADDENDUM: PATHOLOGY revealed: Site 1. Breast, right, needle core biopsy, 2:00, 6 cmfn;10 mm (ribbon clip) : - INVASIVE DUCTAL CARCINOMA - OVERALL GRADE: 3 - LYMPHOVASCULAR INVASION: NOT IDENTIFIED - CANCER LENGTH: 0.7 CM - CALCIFICATIONS: NOT IDENTIFIED. Pathology results are CONCORDANT with imaging findings, per Norleen Croak M.D.  PATHOLOGY revealed: Site 2. Breast, right, needle core biopsy, 1:00, 3 cmfn; 6 mm (venus clip) : - INVASIVE DUCTAL CARCINOMA - OVERALL GRADE: 3 - LYMPHOVASCULAR INVASION: NOT IDENTIFIED - CANCER LENGTH: 0.5 CM - CALCIFICATIONS: NOT IDENTIFIED. Pathology results are CONCORDANT with imaging findings, per Norleen Croak M.D. Pathology results and recommendations below were discussed with patient by telephone on 10/02/2023 by Rock Hover RN. Patient reported biopsy site within normal limits with slight tenderness at the site. Post biopsy care instructions were reviewed, questions were answered and my direct phone number was provided  to patient. Patient was instructed to call [facility] if any concerns or questions arise related to the biopsy. RECOMMENDATIONS: 1. Surgical and oncological consultation. Per patient request, surgical consultation will be made by patient's oncologist (Dr. Zelphia Cap). Rock Hover RN sent epic message to oncologist regarding patient's request for surgical referral and appointment for patient to meet with her. This information was also relayed to Shasta Ada RN at [facility] by Rock Hover RN on 10/02/2023. Pathology results reported by Rock Hover RN on 10/02/2023. Electronically Signed   By: Norleen Croak M.D.   On: 10/02/2023 10:33   Result Date: 10/02/2023 CLINICAL DATA:  Suspicious RIGHT breast masses x2 EXAM: ULTRASOUND GUIDED RIGHT BREAST CORE NEEDLE BIOPSY x2 COMPARISON:  Previous exam(s). PROCEDURE: I met with the patient and we discussed the procedure of ultrasound-guided biopsy, including benefits and alternatives. We discussed the high likelihood of a successful procedure. We discussed the risks of the procedure, including infection, bleeding, tissue injury, clip migration, and inadequate sampling. Informed written consent was given. The usual time-out protocol was performed immediately prior to the procedure. Site 1: Lesion quadrant: Upper inner Using sterile  technique and 1% lidocaine  and 1% lidocaine  with epinephrine  as local anesthetic, under direct ultrasound visualization, a 14 gauge spring-loaded device was used to perform biopsy of the RIGHT breast mass at 2 o'clock 6 cm from the nipple using a from below approach. At the conclusion of the procedure ribbon shaped tissue marker clip was deployed into the biopsy cavity. Site 2: Lesion quadrant: Upper inner Using sterile technique and 1% lidocaine  and 1% lidocaine  with epinephrine  as local anesthetic, under direct ultrasound visualization, a 14 gauge spring-loaded device was used to perform biopsy of the RIGHT breast mass at 1 o'clock 3 cm from the nipple using a from below approach. At the conclusion of the procedure Venus shaped tissue marker clip was deployed into the biopsy cavity. Follow up 2 view mammogram was performed and dictated separately. IMPRESSION: Ultrasound guided biopsy of RIGHT breast masses at 2 and 1 o'clock. No apparent complications. Electronically Signed: By: Norleen Croak M.D. On: 09/28/2023 14:07   US  RT BREAST BX W LOC DEV EA ADD LESION IMG BX SPEC US  GUIDE Addendum Date: 10/02/2023 ADDENDUM REPORT: 10/02/2023 10:33 ADDENDUM: PATHOLOGY revealed: Site 1. Breast, right, needle core biopsy, 2:00, 6 cmfn;10 mm (ribbon clip) : - INVASIVE DUCTAL CARCINOMA - OVERALL GRADE: 3 - LYMPHOVASCULAR INVASION: NOT IDENTIFIED - CANCER LENGTH: 0.7 CM - CALCIFICATIONS: NOT IDENTIFIED. Pathology results are CONCORDANT with imaging findings, per Norleen Croak M.D. PATHOLOGY revealed: Site 2. Breast, right, needle core biopsy, 1:00, 3 cmfn; 6 mm (venus clip) : - INVASIVE DUCTAL CARCINOMA - OVERALL GRADE: 3 - LYMPHOVASCULAR INVASION: NOT IDENTIFIED - CANCER LENGTH: 0.5 CM - CALCIFICATIONS: NOT IDENTIFIED. Pathology results are CONCORDANT with imaging findings, per Norleen Croak M.D. Pathology results and recommendations below were discussed with patient by telephone on 10/02/2023 by Rock Hover RN. Patient reported  biopsy site within normal limits with slight tenderness at the site. Post biopsy care instructions were reviewed, questions were answered and my direct phone number was provided to patient. Patient was instructed to call [facility] if any concerns or questions arise related to the biopsy. RECOMMENDATIONS: 1. Surgical and oncological consultation. Per patient request, surgical consultation will be made by patient's oncologist (Dr. Zelphia Cap). Rock Hover RN sent epic message to oncologist regarding patient's request for surgical referral and appointment for patient to meet with her. This  information was also relayed to Shasta Ada RN at [facility] by Rock Hover RN on 10/02/2023. Pathology results reported by Rock Hover RN on 10/02/2023. Electronically Signed   By: Norleen Croak M.D.   On: 10/02/2023 10:33   Result Date: 10/02/2023 CLINICAL DATA:  Suspicious RIGHT breast masses x2 EXAM: ULTRASOUND GUIDED RIGHT BREAST CORE NEEDLE BIOPSY x2 COMPARISON:  Previous exam(s). PROCEDURE: I met with the patient and we discussed the procedure of ultrasound-guided biopsy, including benefits and alternatives. We discussed the high likelihood of a successful procedure. We discussed the risks of the procedure, including infection, bleeding, tissue injury, clip migration, and inadequate sampling. Informed written consent was given. The usual time-out protocol was performed immediately prior to the procedure. Site 1: Lesion quadrant: Upper inner Using sterile technique and 1% lidocaine  and 1% lidocaine  with epinephrine  as local anesthetic, under direct ultrasound visualization, a 14 gauge spring-loaded device was used to perform biopsy of the RIGHT breast mass at 2 o'clock 6 cm from the nipple using a from below approach. At the conclusion of the procedure ribbon shaped tissue marker clip was deployed into the biopsy cavity. Site 2: Lesion quadrant: Upper inner Using sterile technique and 1% lidocaine  and  1% lidocaine  with epinephrine  as local anesthetic, under direct ultrasound visualization, a 14 gauge spring-loaded device was used to perform biopsy of the RIGHT breast mass at 1 o'clock 3 cm from the nipple using a from below approach. At the conclusion of the procedure Venus shaped tissue marker clip was deployed into the biopsy cavity. Follow up 2 view mammogram was performed and dictated separately. IMPRESSION: Ultrasound guided biopsy of RIGHT breast masses at 2 and 1 o'clock. No apparent complications. Electronically Signed: By: Norleen Croak M.D. On: 09/28/2023 14:07   MM CLIP PLACEMENT RIGHT Result Date: 09/28/2023 CLINICAL DATA:  Status post RIGHT breast biopsy x2 EXAM: 3D DIAGNOSTIC RIGHT MAMMOGRAM POST ULTRASOUND BIOPSY x2 COMPARISON:  Previous exam(s). ACR Breast Density Category b: There are scattered areas of fibroglandular density. FINDINGS: 3D Mammographic images were obtained following ultrasound guided biopsy of RIGHT breast masses at 1 and 2 o'clock. The ribbon shaped biopsy marking clip is in expected location at the site of biopsy of the 2 o'clock mass. The Venus shaped biopsy marking clip is in appropriate position at the site of the 1 o'clock biopsied mass. IMPRESSION: Appropriate positioning of biopsy marking clips at the sites of biopsy in the RIGHT breast. Final Assessment: Post Procedure Mammograms for Marker Placement Electronically Signed   By: Norleen Croak M.D.   On: 09/28/2023 14:58   MM 3D DIAGNOSTIC MAMMOGRAM UNILATERAL RIGHT BREAST Result Date: 09/22/2023 CLINICAL DATA:  Callback for RIGHT breast EXAM: DIGITAL DIAGNOSTIC UNILATERAL RIGHT MAMMOGRAM WITH TOMOSYNTHESIS AND CAD; ULTRASOUND RIGHT BREAST LIMITED TECHNIQUE: Right digital diagnostic mammography and breast tomosynthesis was performed. The images were evaluated with computer-aided detection. ; Targeted ultrasound examination of the right breast was performed COMPARISON:  Previous exam(s). ACR Breast Density Category b:  There are scattered areas of fibroglandular density. FINDINGS: Spot compression tomosynthesis views demonstrates a persistent mass in the RIGHT upper inner breast at posterior depth. This is new compared to prior. Additional questioned asymmetry in the outer breast resolves with additional views, consistent with overlapping tissue. On physical exam, no discrete mass is appreciated. Targeted ultrasound was performed of the RIGHT upper inner breast. At 2 o'clock 6 cm from the nipple there is an irregular hypoechoic mass with irregular margins. This measures 6 x 7 x 10  mm. This corresponds to the site of screening mammographic concern. At 1 o'clock 3 cm from the nipple there is incidental sonographic note of an additional irregular mass with indistinct margins. This measures 6 x 6 x 4 mm. This is approximately 2.3 cm from the margin of the mass of mammographic concern. Targeted ultrasound was performed of the RIGHT axilla. No suspicious axillary lymph nodes are visualized. IMPRESSION: 1. There is a highly suspicious 10 mm mass in the RIGHT upper inner breast. Recommend ultrasound-guided biopsy for definitive characterization. 2. There is a sonographically identified additional 6 mm mass in the RIGHT upper inner breast which is indeterminate. Recommend ultrasound-guided biopsy for definitive characterization. 3. No suspicious RIGHT axillary adenopathy. RECOMMENDATION: RIGHT breast ultrasound-guided biopsy x2 I have discussed the findings and recommendations with the patient. The biopsy procedure was discussed with the patient and questions were answered. Patient expressed their understanding of the biopsy recommendation. Patient expressed hesitation to proceed with biopsy. Need for tissue sampling was discussed with patient. Patient will be scheduled for biopsy at her earliest convenience by the schedulers should she agree to proceed with biopsy. Alternatively, surgical consultation could be pursued. Ordering provider  will be notified. If applicable, a reminder letter will be sent to the patient regarding the next appointment. BI-RADS CATEGORY  4: Suspicious. Electronically Signed   By: Corean Salter M.D.   On: 09/22/2023 11:48   US  LIMITED ULTRASOUND INCLUDING AXILLA RIGHT BREAST Result Date: 09/22/2023 CLINICAL DATA:  Callback for RIGHT breast EXAM: DIGITAL DIAGNOSTIC UNILATERAL RIGHT MAMMOGRAM WITH TOMOSYNTHESIS AND CAD; ULTRASOUND RIGHT BREAST LIMITED TECHNIQUE: Right digital diagnostic mammography and breast tomosynthesis was performed. The images were evaluated with computer-aided detection. ; Targeted ultrasound examination of the right breast was performed COMPARISON:  Previous exam(s). ACR Breast Density Category b: There are scattered areas of fibroglandular density. FINDINGS: Spot compression tomosynthesis views demonstrates a persistent mass in the RIGHT upper inner breast at posterior depth. This is new compared to prior. Additional questioned asymmetry in the outer breast resolves with additional views, consistent with overlapping tissue. On physical exam, no discrete mass is appreciated. Targeted ultrasound was performed of the RIGHT upper inner breast. At 2 o'clock 6 cm from the nipple there is an irregular hypoechoic mass with irregular margins. This measures 6 x 7 x 10 mm. This corresponds to the site of screening mammographic concern. At 1 o'clock 3 cm from the nipple there is incidental sonographic note of an additional irregular mass with indistinct margins. This measures 6 x 6 x 4 mm. This is approximately 2.3 cm from the margin of the mass of mammographic concern. Targeted ultrasound was performed of the RIGHT axilla. No suspicious axillary lymph nodes are visualized. IMPRESSION: 1. There is a highly suspicious 10 mm mass in the RIGHT upper inner breast. Recommend ultrasound-guided biopsy for definitive characterization. 2. There is a sonographically identified additional 6 mm mass in the RIGHT upper  inner breast which is indeterminate. Recommend ultrasound-guided biopsy for definitive characterization. 3. No suspicious RIGHT axillary adenopathy. RECOMMENDATION: RIGHT breast ultrasound-guided biopsy x2 I have discussed the findings and recommendations with the patient. The biopsy procedure was discussed with the patient and questions were answered. Patient expressed their understanding of the biopsy recommendation. Patient expressed hesitation to proceed with biopsy. Need for tissue sampling was discussed with patient. Patient will be scheduled for biopsy at her earliest convenience by the schedulers should she agree to proceed with biopsy. Alternatively, surgical consultation could be pursued. Ordering provider will be notified. If  applicable, a reminder letter will be sent to the patient regarding the next appointment. BI-RADS CATEGORY  4: Suspicious. Electronically Signed   By: Corean Salter M.D.   On: 09/22/2023 11:48   MM 3D SCREENING MAMMOGRAM BILATERAL BREAST Result Date: 09/18/2023 CLINICAL DATA:  Screening. EXAM: DIGITAL SCREENING BILATERAL MAMMOGRAM WITH TOMOSYNTHESIS AND CAD TECHNIQUE: Bilateral screening digital craniocaudal and mediolateral oblique mammograms were obtained. Bilateral screening digital breast tomosynthesis was performed. The images were evaluated with computer-aided detection. COMPARISON:  Previous exam(s). ACR Breast Density Category b: There are scattered areas of fibroglandular density. FINDINGS: In the right breast, possible asymmetries warrant further evaluation. In the left breast, no findings suspicious for malignancy. IMPRESSION: Further evaluation is suggested for possible asymmetries in the right breast. RECOMMENDATION: Diagnostic mammogram and possibly ultrasound of the right breast. (Code:[id number]) The patient will be contacted regarding the findings, and additional imaging will be scheduled. BI-RADS CATEGORY  0: Incomplete: Need additional imaging evaluation.  Electronically Signed   By: Corean Salter M.D.   On: 09/18/2023 10:57

## 2023-10-14 LAB — CANCER ANTIGEN 27.29: CA 27.29: 18.1 U/mL (ref 0.0–38.6)

## 2023-10-14 LAB — CANCER ANTIGEN 15-3: CA 15-3: 15.2 U/mL (ref 0.0–25.0)

## 2023-10-17 ENCOUNTER — Encounter: Payer: Self-pay | Admitting: *Deleted

## 2023-10-17 NOTE — Progress Notes (Signed)
 Ms. Furniss will see Dr. Cesar on Tuesday 9/9 at 9:45, Breast MRI is scheduled for 9/5, she wanted to wait until after the MRI to see Dr. Cesar.

## 2023-10-20 ENCOUNTER — Ambulatory Visit
Admission: RE | Admit: 2023-10-20 | Discharge: 2023-10-20 | Disposition: A | Discharge status: Home / Self Care | Source: Ambulatory Visit | Attending: Oncology | Admitting: Oncology

## 2023-10-20 DIAGNOSIS — Z171 Estrogen receptor negative status [ER-]: Secondary | ICD-10-CM | POA: Diagnosis present

## 2023-10-20 DIAGNOSIS — C50911 Malignant neoplasm of unspecified site of right female breast: Secondary | ICD-10-CM | POA: Insufficient documentation

## 2023-10-20 MED ORDER — GADOBUTROL 1 MMOL/ML IV SOLN
7.0000 mL | Freq: Once | INTRAVENOUS | Status: AC | PRN
  Administered 2023-10-20: 7 mL via INTRAVENOUS

## 2023-10-23 ENCOUNTER — Ambulatory Visit: Payer: Self-pay | Admitting: Oncology

## 2023-10-24 ENCOUNTER — Encounter: Payer: Self-pay | Admitting: Oncology

## 2023-10-24 ENCOUNTER — Other Ambulatory Visit: Payer: Self-pay | Admitting: *Deleted

## 2023-10-24 ENCOUNTER — Encounter: Payer: Self-pay | Admitting: *Deleted

## 2023-10-24 ENCOUNTER — Inpatient Hospital Stay: Attending: Oncology | Admitting: Oncology

## 2023-10-24 VITALS — BP 138/99 | HR 106 | Temp 97.9°F | Resp 18 | Wt 158.6 lb

## 2023-10-24 DIAGNOSIS — Z171 Estrogen receptor negative status [ER-]: Secondary | ICD-10-CM | POA: Insufficient documentation

## 2023-10-24 DIAGNOSIS — C50211 Malignant neoplasm of upper-inner quadrant of right female breast: Secondary | ICD-10-CM | POA: Diagnosis not present

## 2023-10-24 DIAGNOSIS — K760 Fatty (change of) liver, not elsewhere classified: Secondary | ICD-10-CM | POA: Insufficient documentation

## 2023-10-24 DIAGNOSIS — Z809 Family history of malignant neoplasm, unspecified: Secondary | ICD-10-CM | POA: Diagnosis not present

## 2023-10-24 DIAGNOSIS — Z801 Family history of malignant neoplasm of trachea, bronchus and lung: Secondary | ICD-10-CM | POA: Insufficient documentation

## 2023-10-24 DIAGNOSIS — Z8051 Family history of malignant neoplasm of kidney: Secondary | ICD-10-CM | POA: Insufficient documentation

## 2023-10-24 DIAGNOSIS — Z1722 Progesterone receptor negative status: Secondary | ICD-10-CM | POA: Insufficient documentation

## 2023-10-24 DIAGNOSIS — R000 Tachycardia, unspecified: Secondary | ICD-10-CM | POA: Insufficient documentation

## 2023-10-24 DIAGNOSIS — Z5112 Encounter for antineoplastic immunotherapy: Secondary | ICD-10-CM | POA: Insufficient documentation

## 2023-10-24 DIAGNOSIS — Z5111 Encounter for antineoplastic chemotherapy: Secondary | ICD-10-CM | POA: Diagnosis present

## 2023-10-24 DIAGNOSIS — C50911 Malignant neoplasm of unspecified site of right female breast: Secondary | ICD-10-CM

## 2023-10-24 DIAGNOSIS — C50919 Malignant neoplasm of unspecified site of unspecified female breast: Secondary | ICD-10-CM | POA: Diagnosis not present

## 2023-10-24 DIAGNOSIS — I5189 Other ill-defined heart diseases: Secondary | ICD-10-CM | POA: Insufficient documentation

## 2023-10-24 DIAGNOSIS — Z1731 Human epidermal growth factor receptor 2 positive status: Secondary | ICD-10-CM | POA: Diagnosis not present

## 2023-10-24 MED ORDER — PROCHLORPERAZINE MALEATE 10 MG PO TABS: 10.0000 mg | ORAL_TABLET | Freq: Four times a day (QID) | ORAL | 1 refills | Status: DC | PRN

## 2023-10-24 MED ORDER — DEXAMETHASONE 4 MG PO TABS: ORAL_TABLET | ORAL | 1 refills | Status: DC

## 2023-10-24 MED ORDER — LIDOCAINE-PRILOCAINE 2.5-2.5 % EX CREA: TOPICAL_CREAM | CUTANEOUS | 3 refills | Status: DC

## 2023-10-24 MED ORDER — ONDANSETRON HCL 8 MG PO TABS: 8.0000 mg | ORAL_TABLET | Freq: Three times a day (TID) | ORAL | 1 refills | Status: DC | PRN

## 2023-10-24 NOTE — Progress Notes (Signed)
 Per Dr. Babara, MRI showed breast mass is slightly larger and she recommends neoadjuvant chemotherapy.   Annette Phillips has met with Dr. Cesar and she will see Dr. Babara today to further discuss chemotherapy.

## 2023-10-24 NOTE — Assessment & Plan Note (Addendum)
 MRI breast results were reviewed and discussed with patient. Breast mass sites are.  And 2 sites spans a total distance of 4.2 cm.  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  Rationale was discussed with patient. I explained to the patient the risks and benefits of chemotherapy including all but not limited to infusion reaction, hair loss, hearing loss, mouth sore, nausea, vomiting, low blood counts, bleeding, heart failure, kidney, neuropathy and risk of life threatening infection and even death, secondary malignancy etc.  Patient voices understanding and willing to proceed chemotherapy.   # Chemotherapy education; patient elects to have Medi port placement-will be placed by Dr. Vinita. Antiemetics-Zofran  and Compazine ; EMLA  cream sent to pharmacy Supportive care measures are necessary for patient well-being and will be provided as necessary. We spent sufficient time to discuss many aspect of care, questions were answered to patient's satisfaction.  Obtain echocardiogram to assess baseline heart function.

## 2023-10-24 NOTE — Progress Notes (Signed)
 Hematology/Oncology Progress note [phone] [phone]       REFERRING PROVIDER: Myrla Jon HERO, MD  CHIEF COMPLAINTS/REASON FOR VISIT:  Iron  deficiency anemia. HER2 positive breast cancer.  ASSESSMENT & PLAN:   Cancer Staging  Invasive carcinoma of breast (HCC) Staging form: Breast, AJCC 8th Edition - Clinical stage from 10/13/2023: Stage IIA (cT2, cN0, cM0, G3, ER-, PR-, HER2+) - Signed by [name], MD on 10/24/2023   Invasive carcinoma of breast [facility]) MRI breast results were reviewed and discussed with patient. Breast mass sites are.  And 2 sites spans a total distance of 4.2 cm.  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  Rationale was discussed with patient. I explained to the patient the risks and benefits of chemotherapy including all but not limited to infusion reaction, hair loss, hearing loss, mouth sore, nausea, vomiting, low blood counts, bleeding, heart failure, kidney, neuropathy and risk of life threatening infection and even death, secondary malignancy etc.  Patient voices understanding and willing to proceed chemotherapy.   # Chemotherapy education; patient elects to have Medi port placement-will be placed by Dr. Vinita. Antiemetics-Zofran  and Compazine ; EMLA  cream sent to pharmacy Supportive care measures are necessary for patient well-being and will be provided as necessary. We spent sufficient time to discuss many aspect of care, questions were answered to patient's satisfaction.  Obtain echocardiogram to assess baseline heart function.   Family history of cancer Previously discussed about genetic counseling. She would like to defer.   Patient prefers to start treatments in 3 weeks.    Orders Placed This Encounter  Procedures   Consent Attestation for Oncology Treatment    The patient is informed of risks, benefits, side-effects  of the prescribed oncology treatment. Potential short term and long term side effects and response rates discussed. After a long discussion, the patient made informed decision to proceed.:   Yes   CBC with Differential (Cancer Center Only)    Standing Status:   Future    Expected Date:   11/13/2023    Expiration Date:   11/12/2024   CMP (Cancer Center only)    Standing Status:   Future    Expected Date:   11/13/2023    Expiration Date:   11/12/2024   PHYSICIAN COMMUNICATION ORDER    A baseline Echo/Muga should be obtained prior to initiation of Trastuzumab, at 3, 6, 9 months during Trastuzumab treatment.   ONCBCN PHYSICIAN COMMUNICATION 1    Trastuzumab/Pertuzumab SQ options for each treatment day available via Add Orders.     Patient all questions were answered. The patient knows to call the clinic with any problems, questions or concerns.  Call Babara, MD, PhD [facility] Hematology Oncology 10/24/2023     HISTORY OF PRESENTING ILLNESS:  Annette Phillips is a  58 y.o.  female with PMH listed below who presents for follow up   History of iron  deficiency anemia.  09/14/21 EGD: Small hiatal hernia, nonobstructing Schatzki ring, stomach and duodenum normal.  Colonoscopy: Bleeding hemorrhoids grade 3.  10/08/21 capsule study negative.  S/p hemorrhoid banding in Sept 2024, recently started to have rectal bleeding.   + fatigue. + right side abdomen discomfort, intermittently. Recent US  showed no acute finding. Fatty liver disease. History of cholecystectomy.  She denies recent chest pain on exertion, pre-syncopal episodes, or palpitations She works as Careers adviser  Oncology History  Invasive carcinoma of  breast (HCC)  09/18/2023 Mammogram   Bilateral screening mammogram  In the right breast, possible asymmetries warrant further evaluation. In the left breast, no findings suspicious for malignancy.   09/22/2023 Mammogram   Right breast diagnostic mammogram   1.  There is a highly suspicious 10 mm mass in the RIGHT upper inner breast. Recommend ultrasound-guided biopsy for definitive characterization. 2. There is a sonographically identified additional 6 mm mass in the RIGHT upper inner breast which is indeterminate. Recommend ultrasound-guided biopsy for definitive characterization. 3. No suspicious RIGHT axillary adenopathy.   09/28/2023 Initial Diagnosis   Invasive carcinoma of breast (HCC)  S/p right breast biopsy.   1. Breast, right, needle core biopsy, 2:00 6cmfn 10mm (ribbon clip) - INVASIVE DUCTAL CARCINOMA, SEE NOTE - TUBULE FORMATION: SCORE 3 - NUCLEAR PLEOMORPHISM: SCORE 3 - MITOTIC COUNT: SCORE 3 - TOTAL SCORE: 9 - OVERALL GRADE: 3 - LYMPHOVASCULAR INVASION: NOT IDENTIFIED - CANCER LENGTH: 0.7 CM - CALCIFICATIONS: NOT IDENTIFIED - OTHER FINDINGS: NONE 2. Breast, right, needle core biopsy, 1:00 3cmfn 6mm (venus clip) - INVASIVE DUCTAL CARCINOMA, SEE NOTE - TUBULE FORMATION: SCORE 3 - NUCLEAR PLEOMORPHISM: SCORE 3 - MITOTIC COUNT: SCORE 3 - TOTAL SCORE: 9 - OVERALL GRADE: 3 - LYMPHOVASCULAR INVASION: NOT IDENTIFIED - CANCER LENGTH: 0.5 CM - CALCIFICATIONS: NOT IDENTIFIED - OTHER FINDINGS: NONE  1. 1) Breast, right, needle core biopsy, 2:00 6 cmfn 10mm ( ribbon clip) PROGNOSTIC INDICATORS Results: IMMUNOHISTOCHEMICAL AND MORPHOMETRIC ANALYSIS PERFORMED MANUALLY The tumor cells are POSITIVE for Her2 (3+). Estrogen Receptor: 0%, NEGATIVE Progesterone Receptor: 0%, NEGATIVE Proliferation Marker Ki67: 50%     10/13/2023 Cancer Staging   Staging form: Breast, AJCC 8th Edition - Clinical stage from 10/13/2023: Stage IIA (cT2, cN0, cM0, G3, ER-, PR-, HER2+) - Signed by [name], MD on 10/24/2023 Stage prefix: Initial diagnosis Histologic grading system: 3 grade system   10/20/2023 Imaging   Bilateral MRI breast with and without contrast showed 1. Two sites of UPPER-OUTER RIGHT breast biopsy-proven malignancy spanning a total  distance of 4.2 cm (see above). 2. No other MR evidence of malignancy within either breast. No abnormal appearing lymph nodes.   11/13/2023 -  Chemotherapy   Patient is on Treatment Plan : BREAST  Docetaxel + Carboplatin + Trastuzumab + Pertuzumab  (TCHP) q21d       Patient presents to discuss neoadjuvant chemotherapy plan.  She has had MRI breast done and presented to discuss results and the change of treatment plan.  She met surgeon Dr. Cesar today.  Accompanied by daughter.   MEDICAL HISTORY:  Past Medical History:  Diagnosis Date   Allergy    Penicillin, Aspirin   Anemia July 2023   Blood transfusion without reported diagnosis July 2023   Goiter    Hypothyroid    Internal hemorrhoid, bleeding 12/2022   Iron  deficiency anemia    Pre-diabetes    Recurrent cold sores    Sinus tachycardia     SURGICAL HISTORY: Past Surgical History:  Procedure Laterality Date   APPENDECTOMY     BREAST BIOPSY Right 05/21/2009   benign   BREAST BIOPSY Right 09/28/2023   US  RT BREAST BX W LOC DEV 1ST LESION IMG BX SPEC US  GUIDE 09/28/2023 [facility]   BREAST BIOPSY Right 09/28/2023   US  RT BREAST BX W LOC DEV EA ADD LESION IMG BX SPEC US  GUIDE 09/28/2023 [facility]   CHOLECYSTECTOMY N/A 02/28/2019   Procedure: LAPAROSCOPIC CHOLECYSTECTOMY;  Surgeon: Desiderio Schanz, MD;  Location: [facility];  Service:  General;  Laterality: N/A;   COLONOSCOPY WITH PROPOFOL  N/A 09/14/2021   Procedure: COLONOSCOPY WITH PROPOFOL ;  Surgeon: Jinny Carmine, MD;  Location: [facility] END[facility];  Service: Endoscopy;  Laterality: N/A;   DE QUERVAIN'S RELEASE Right 06/2022   ESOPHAGOGASTRODUODENOSCOPY (EGD) WITH PROPOFOL  N/A 02/27/2019   Procedure: ESOPHAGOGASTRODUODENOSCOPY (EGD) WITH PROPOFOL ;  Surgeon: Unk Corinn Skiff, MD;  Location: [facility] END[facility]COPY;  Service: Gastroenterology;  Laterality: N/A;   ESOPHAGOGASTRODUODENOSCOPY (EGD) WITH PROPOFOL  N/A 09/14/2021   Procedure: ESOPHAGOGASTRODUODENOSCOPY (EGD) WITH  PROPOFOL ;  Surgeon: Jinny Carmine, MD;  Location: [facility]COPY;  Service: Endoscopy;  Laterality: N/A;   GIVENS CAPSULE STUDY N/A 09/15/2021   Procedure: GIVENS CAPSULE STUDY;  Surgeon: Jinny Carmine, MD;  Location: [facility] ENDOSCOPY;  Service: Endoscopy;  Laterality: N/A;   HEMORRHOID BANDING  10/20/2022   HEMORRHOID SURGERY N/A 12/28/2022   Procedure: HEMORRHOIDECTOMY;  Surgeon: Rodolph Romano, MD;  Location: [facility] ORS;  Service: General;  Laterality: N/A;   KNEE ARTHROSCOPY Left 01/04/2007   torn meniscus   VITRECTOMY Right 09/2022    SOCIAL HISTORY: Social History   Socioeconomic History   Marital status: Divorced    Spouse name: Not on file   Number of children: 3   Years of education: Not on file   Highest education level: Bachelor's degree (e.g., BA, AB, BS)  Occupational History   Not on file  Tobacco Use   Smoking status: Never   Smokeless tobacco: Never  Vaping Use   Vaping status: Never Used  Substance and Sexual Activity   Alcohol use: Never   Drug use: Never   Sexual activity: Not Currently    Birth control/protection: Abstinence, Post-menopausal  Other Topics Concern   Not on file  Social History Narrative   Lives alone   Social Drivers of Health   Financial Resource Strain: Low Risk  (07/31/2023)   Overall Financial Resource Strain (CARDIA)    Difficulty of Paying Living Expenses: Not hard at all  Food Insecurity: No Food Insecurity (07/31/2023)   Hunger Vital Sign    Worried About Running Out of Food in the Last Year: Never true    Ran Out of Food in the Last Year: Never true  Transportation Needs: No Transportation Needs (07/31/2023)   PRAPARE - Administrator, Civil Service (Medical): No    Lack of Transportation (Non-Medical): No  Physical Activity: Sufficiently Active (07/31/2023)   Exercise Vital Sign    Days of Exercise per Week: 4 days    Minutes of Exercise per Session: 50 min  Stress: No Stress Concern Present (07/31/2023)    [employer] of Occupational Health - Occupational Stress Questionnaire    Feeling of Stress: Not at all  Social Connections: Moderately Integrated (07/31/2023)   Social Connection and Isolation Panel    Frequency of Communication with Friends and Family: More than three times a week    Frequency of Social Gatherings with Friends and Family: More than three times a week    Attends Religious Services: More than 4 times per year    Active Member of [employer] or Organizations: Yes    Attends Engineer, structural: More than 4 times per year    Marital Status: Divorced  Catering manager Violence: Not on file    FAMILY HISTORY: Family History  Problem Relation Age of Onset   Thyroid  disease Mother    Diabetes Mellitus II Mother    Diabetes Mother    Thyroid  disease Father    Healthy Sister    Hypothyroidism  Daughter    Diabetes Son    Hypothyroidism Son    Diabetes Mellitus I Son    Kidney cancer Maternal Grandmother    Lung cancer Maternal Grandfather        smoker   Colon cancer Neg Hx    Breast cancer Neg Hx     ALLERGIES:  is allergic to aspirin and penicillin g.  MEDICATIONS:  Current Outpatient Medications  Medication Sig Dispense Refill   Multiple Vitamin (MULTI-VITAMINS) TABS Take 1 tablet by mouth in the morning.     psyllium (REGULOID) 0.52 g capsule Take 0.52 g by mouth daily.     SYNTHROID  88 MCG tablet TAKE 1 TABLET BY MOUTH DAILY BEFORE BREAKFAST. 90 tablet 1   dexamethasone  (DECADRON ) 4 MG tablet Take 2 tabs by mouth 2 times daily starting day before chemo. Then take 2 tabs daily for 2 days starting day after chemo. Take with food. 30 tablet 1   lidocaine -prilocaine  (EMLA ) cream Apply to affected area once 30 g 3   ondansetron  (ZOFRAN ) 8 MG tablet Take 1 tablet (8 mg total) by mouth every 8 (eight) hours as needed for nausea or vomiting. Start on the third day after chemotherapy. 30 tablet 1   prochlorperazine  (COMPAZINE ) 10 MG tablet Take 1 tablet (10 mg  total) by mouth every 6 (six) hours as needed for nausea or vomiting. 30 tablet 1   No current facility-administered medications for this visit.    Review of Systems  Constitutional:  Positive for fatigue. Negative for appetite change, chills and fever.  HENT:   Negative for hearing loss and voice change.   Eyes:  Negative for eye problems.  Respiratory:  Negative for chest tightness and cough.   Cardiovascular:  Negative for chest pain.  Gastrointestinal:  Negative for abdominal distention and abdominal pain.  Endocrine: Negative for hot flashes.  Genitourinary:  Negative for difficulty urinating and frequency.   Musculoskeletal:  Negative for arthralgias.  Skin:  Negative for itching and rash.  Neurological:  Negative for extremity weakness.  Hematological:  Negative for adenopathy.  Psychiatric/Behavioral:  Negative for confusion.     PHYSICAL EXAMINATION: ECOG PERFORMANCE STATUS: 1 - Symptomatic but completely ambulatory Vitals:   10/24/23 1137  BP: (!) 138/99  Pulse: (!) 106  Resp: 18  Temp: 97.9 F (36.6 C)  SpO2: 97%   Filed Weights   10/24/23 1137  Weight: 158 lb 9.6 oz (71.9 kg)    Physical Exam Constitutional:      General: She is not in acute distress. HENT:     Head: Normocephalic and atraumatic.  Eyes:     General: No scleral icterus. Cardiovascular:     Rate and Rhythm: Normal rate.  Pulmonary:     Effort: Pulmonary effort is normal. No respiratory distress.  Abdominal:     General: There is no distension.  Musculoskeletal:        General: Normal range of motion.     Cervical back: Normal range of motion and neck supple.  Skin:    Findings: No erythema.  Neurological:     Mental Status: She is alert and oriented to person, place, and time. Mental status is at baseline.  Psychiatric:        Mood and Affect: Mood normal.      LABORATORY DATA:  I have reviewed the data as listed    Latest Ref Rng & Units 10/13/2023   12:26 PM 04/10/2023     8:15 AM 12/05/2022  8:04 AM  CBC  WBC 4.0 - 10.5 K/uL 5.7  5.3  4.4   Hemoglobin 12.0 - 15.0 g/dL 86.2  85.8  89.1   Hematocrit 36.0 - 46.0 % 40.5  42.2  35.7   Platelets 150 - 400 K/uL 205  236  220       Latest Ref Rng & Units 10/13/2023   12:26 PM 08/01/2023   12:00 AM 09/19/2022    8:11 AM  CMP  Glucose 70 - 99 mg/dL 96  99  892   BUN 6 - 20 mg/dL 18  19  24    Creatinine 0.44 - 1.00 mg/dL 9.21  9.38  9.34   Sodium 135 - 145 mmol/L 132  136  134   Potassium 3.5 - 5.1 mmol/L 4.2  4.1  4.3   Chloride 98 - 111 mmol/L 100  99  105   CO2 22 - 32 mmol/L 25  21  25    Calcium 8.9 - 10.3 mg/dL 9.6  9.8  8.8   Total Protein 6.5 - 8.1 g/dL 8.2  7.7  7.1   Total Bilirubin 0.0 - 1.2 mg/dL 1.0  0.3  0.2   Alkaline Phos 38 - 126 U/L 52  69  50   AST 15 - 41 U/L 23  23  19    ALT 0 - 44 U/L 27  29  22        Component Value Date/Time   IRON  86 04/10/2023 0815   IRON  26 (L) 11/26/2021 1336   TIBC 364 04/10/2023 0815   TIBC 424 11/26/2021 1336   FERRITIN 85 04/10/2023 0815   FERRITIN 11 (L) 11/26/2021 1336   IRONPCTSAT 24 04/10/2023 0815   IRONPCTSAT 6 (LL) 11/26/2021 1336     RADIOGRAPHIC STUDIES: I have personally reviewed the radiological images as listed and agreed with the findings in the report. MR BREAST BILATERAL W WO CONTRAST INC CAD Result Date: 10/23/2023 CLINICAL DATA:  58 year old female with 2 sites of newly diagnosed RIGHT breast invasive ductal carcinoma. EXAM: BILATERAL BREAST MRI WITH AND WITHOUT CONTRAST TECHNIQUE: Multiplanar, multisequence MR images of both breasts were obtained prior to and following the intravenous administration of 7 ml of Gadavist  Three-dimensional MR images were rendered by post-processing of the original MR data on an independent workstation. The three-dimensional MR images were interpreted, and findings are reported in the following complete MRI report for this study. Three dimensional images were evaluated at the independent interpreting  workstation using the DynaCAD thin client. COMPARISON:  Prior mammograms and ultrasounds FINDINGS: Breast composition: b. Scattered fibroglandular tissue. Background parenchymal enhancement: Mild Right breast: A 2.7 x 1.2 x 1.2 cm (AP x transverse x CC) mass and nonmasslike enhancement contains biopsy clip artifact (RIBBON clip) within the middle to posterior depth UPPER OUTER RIGHT breast is compatible with biopsy-proven malignancy (images 58-65: Series 9). Malignant non masslike enhancement extends 1.3 cm posterior to the RIBBON biopsy clip artifact. Biopsy clip artifact (VENUS clip) with minimal adjacent enhancement is noted within the middle depth UPPER-OUTER RIGHT breast (image 59: Series 9) at additional site of biopsy-proven malignancy. The extent of malignancy from the VENUS biopsy clip artifact to the posterior aspect of non masslike enhancement (associated with RIBBON biopsy clip site) measures an AP distance of 4.2 cm. No other suspicious mass or worrisome enhancement identified. Left breast: No suspicious mass or worrisome enhancement. Lymph nodes: No abnormal appearing lymph nodes. Ancillary findings:  None. IMPRESSION: 1. Two sites of UPPER-OUTER RIGHT breast biopsy-proven malignancy spanning  a total distance of 4.2 cm (see above). 2. No other MR evidence of malignancy within either breast. No abnormal appearing lymph nodes. RECOMMENDATION: Treatment plan BI-RADS CATEGORY  6: Known biopsy-proven malignancy. Electronically Signed   By: Reyes Phi M.D.   On: 10/23/2023 09:54   US  RT BREAST BX W LOC DEV 1ST LESION IMG BX SPEC US  GUIDE Addendum Date: 10/02/2023 ADDENDUM REPORT: 10/02/2023 10:33 ADDENDUM: PATHOLOGY revealed: Site 1. Breast, right, needle core biopsy, 2:00, 6 cmfn;10 mm (ribbon clip) : - INVASIVE DUCTAL CARCINOMA - OVERALL GRADE: 3 - LYMPHOVASCULAR INVASION: NOT IDENTIFIED - CANCER LENGTH: 0.7 CM - CALCIFICATIONS: NOT IDENTIFIED. Pathology results are CONCORDANT with imaging findings,  per Norleen Croak M.D. PATHOLOGY revealed: Site 2. Breast, right, needle core biopsy, 1:00, 3 cmfn; 6 mm (venus clip) : - INVASIVE DUCTAL CARCINOMA - OVERALL GRADE: 3 - LYMPHOVASCULAR INVASION: NOT IDENTIFIED - CANCER LENGTH: 0.5 CM - CALCIFICATIONS: NOT IDENTIFIED. Pathology results are CONCORDANT with imaging findings, per Norleen Croak M.D. Pathology results and recommendations below were discussed with patient by telephone on 10/02/2023 by Rock Hover RN. Patient reported biopsy site within normal limits with slight tenderness at the site. Post biopsy care instructions were reviewed, questions were answered and my direct phone number was provided to patient. Patient was instructed to call [facility] if any concerns or questions arise related to the biopsy. RECOMMENDATIONS: 1. Surgical and oncological consultation. Per patient request, surgical consultation will be made by patient's oncologist (Dr. Zelphia Cap). Rock Hover RN sent epic message to oncologist regarding patient's request for surgical referral and appointment for patient to meet with her. This information was also relayed to Shasta Ada RN at [facility] by Rock Hover RN on 10/02/2023. Pathology results reported by Rock Hover RN on 10/02/2023. Electronically Signed   By: Norleen Croak M.D.   On: 10/02/2023 10:33   Result Date: 10/02/2023 CLINICAL DATA:  Suspicious RIGHT breast masses x2 EXAM: ULTRASOUND GUIDED RIGHT BREAST CORE NEEDLE BIOPSY x2 COMPARISON:  Previous exam(s). PROCEDURE: I met with the patient and we discussed the procedure of ultrasound-guided biopsy, including benefits and alternatives. We discussed the high likelihood of a successful procedure. We discussed the risks of the procedure, including infection, bleeding, tissue injury, clip migration, and inadequate sampling. Informed written consent was given. The usual time-out protocol was performed immediately prior to the procedure. Site 1: Lesion quadrant: Upper  inner Using sterile technique and 1% lidocaine  and 1% lidocaine  with epinephrine  as local anesthetic, under direct ultrasound visualization, a 14 gauge spring-loaded device was used to perform biopsy of the RIGHT breast mass at 2 o'clock 6 cm from the nipple using a from below approach. At the conclusion of the procedure ribbon shaped tissue marker clip was deployed into the biopsy cavity. Site 2: Lesion quadrant: Upper inner Using sterile technique and 1% lidocaine  and 1% lidocaine  with epinephrine  as local anesthetic, under direct ultrasound visualization, a 14 gauge spring-loaded device was used to perform biopsy of the RIGHT breast mass at 1 o'clock 3 cm from the nipple using a from below approach. At the conclusion of the procedure Venus shaped tissue marker clip was deployed into the biopsy cavity. Follow up 2 view mammogram was performed and dictated separately. IMPRESSION: Ultrasound guided biopsy of RIGHT breast masses at 2 and 1 o'clock. No apparent complications. Electronically Signed: By: Norleen Croak M.D. On: 09/28/2023 14:07   US  RT BREAST BX W LOC DEV EA ADD LESION IMG BX SPEC US  GUIDE  Addendum Date: 10/02/2023 ADDENDUM REPORT: 10/02/2023 10:33 ADDENDUM: PATHOLOGY revealed: Site 1. Breast, right, needle core biopsy, 2:00, 6 cmfn;10 mm (ribbon clip) : - INVASIVE DUCTAL CARCINOMA - OVERALL GRADE: 3 - LYMPHOVASCULAR INVASION: NOT IDENTIFIED - CANCER LENGTH: 0.7 CM - CALCIFICATIONS: NOT IDENTIFIED. Pathology results are CONCORDANT with imaging findings, per Norleen Croak M.D. PATHOLOGY revealed: Site 2. Breast, right, needle core biopsy, 1:00, 3 cmfn; 6 mm (venus clip) : - INVASIVE DUCTAL CARCINOMA - OVERALL GRADE: 3 - LYMPHOVASCULAR INVASION: NOT IDENTIFIED - CANCER LENGTH: 0.5 CM - CALCIFICATIONS: NOT IDENTIFIED. Pathology results are CONCORDANT with imaging findings, per Norleen Croak M.D. Pathology results and recommendations below were discussed with patient by telephone on 10/02/2023 by Rock Hover RN.  Patient reported biopsy site within normal limits with slight tenderness at the site. Post biopsy care instructions were reviewed, questions were answered and my direct phone number was provided to patient. Patient was instructed to call [facility] if any concerns or questions arise related to the biopsy. RECOMMENDATIONS: 1. Surgical and oncological consultation. Per patient request, surgical consultation will be made by patient's oncologist (Dr. Zelphia Cap). Rock Hover RN sent epic message to oncologist regarding patient's request for surgical referral and appointment for patient to meet with her. This information was also relayed to Shasta Ada RN at [facility] by Rock Hover RN on 10/02/2023. Pathology results reported by Rock Hover RN on 10/02/2023. Electronically Signed   By: Norleen Croak M.D.   On: 10/02/2023 10:33   Result Date: 10/02/2023 CLINICAL DATA:  Suspicious RIGHT breast masses x2 EXAM: ULTRASOUND GUIDED RIGHT BREAST CORE NEEDLE BIOPSY x2 COMPARISON:  Previous exam(s). PROCEDURE: I met with the patient and we discussed the procedure of ultrasound-guided biopsy, including benefits and alternatives. We discussed the high likelihood of a successful procedure. We discussed the risks of the procedure, including infection, bleeding, tissue injury, clip migration, and inadequate sampling. Informed written consent was given. The usual time-out protocol was performed immediately prior to the procedure. Site 1: Lesion quadrant: Upper inner Using sterile technique and 1% lidocaine  and 1% lidocaine  with epinephrine  as local anesthetic, under direct ultrasound visualization, a 14 gauge spring-loaded device was used to perform biopsy of the RIGHT breast mass at 2 o'clock 6 cm from the nipple using a from below approach. At the conclusion of the procedure ribbon shaped tissue marker clip was deployed into the biopsy cavity. Site 2: Lesion quadrant: Upper inner Using sterile technique and  1% lidocaine  and 1% lidocaine  with epinephrine  as local anesthetic, under direct ultrasound visualization, a 14 gauge spring-loaded device was used to perform biopsy of the RIGHT breast mass at 1 o'clock 3 cm from the nipple using a from below approach. At the conclusion of the procedure Venus shaped tissue marker clip was deployed into the biopsy cavity. Follow up 2 view mammogram was performed and dictated separately. IMPRESSION: Ultrasound guided biopsy of RIGHT breast masses at 2 and 1 o'clock. No apparent complications. Electronically Signed: By: Norleen Croak M.D. On: 09/28/2023 14:07   MM CLIP PLACEMENT RIGHT Result Date: 09/28/2023 CLINICAL DATA:  Status post RIGHT breast biopsy x2 EXAM: 3D DIAGNOSTIC RIGHT MAMMOGRAM POST ULTRASOUND BIOPSY x2 COMPARISON:  Previous exam(s). ACR Breast Density Category b: There are scattered areas of fibroglandular density. FINDINGS: 3D Mammographic images were obtained following ultrasound guided biopsy of RIGHT breast masses at 1 and 2 o'clock. The ribbon shaped biopsy marking clip is in expected location at the site of biopsy of the 2  o'clock mass. The Venus shaped biopsy marking clip is in appropriate position at the site of the 1 o'clock biopsied mass. IMPRESSION: Appropriate positioning of biopsy marking clips at the sites of biopsy in the RIGHT breast. Final Assessment: Post Procedure Mammograms for Marker Placement Electronically Signed   By: Norleen Croak M.D.   On: 09/28/2023 14:58

## 2023-10-24 NOTE — Assessment & Plan Note (Signed)
 Previously discussed about genetic counseling. She would like to defer.

## 2023-10-24 NOTE — H&P (View-Only) (Signed)
 History of Present Illness Annette Phillips is a 58 year old female with breast cancer who presents for evaluation of her condition referred by Dr. Babara.  She underwent a screening mammogram on July 31st, 2025, which revealed possible asymmetries in the right breast. A subsequent diagnostic mammogram on August 8th, 2025, identified a highly suspicious 10 mm mass and a 6 mm mass in the right upper inner breast. 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  Following the biopsy results, she was referred to medical oncology, where a bilateral MRI with and without IV contrast was recommended. The MRI showed two sites of proven malignancy spanning 4.2 cm. She has expressed concern about the implications of chemotherapy, particularly hair loss. She prefers a total mastectomy over a partial one, stating, 'I don't care if I lose my breasts, but I don't want to lose my hair.'  She has no interest in breast reconstruction and prefers to use external prostheses post-surgery.      PAST MEDICAL HISTORY:  Past Medical History:  Diagnosis Date  . Breast cancer (CMS/HHS-HCC) 10/2023   Right --- DR Babara  . Hypothyroidism         PAST SURGICAL HISTORY:   Past Surgical History:  Procedure Laterality Date  . HEMORRHOIDECTOMY INTERNAL & EXTERNAL  12/28/2022   Dr Lucas Catchings  . APPENDECTOMY    . CHOLECYSTECTOMY    . knee surgery Left          MEDICATIONS:  Outpatient Encounter Medications as of 10/24/2023  Medication Sig Dispense Refill  . levothyroxine  (SYNTHROID ) 88 MCG tablet Take by mouth once daily    . multivitamin tablet Take 1 tablet by mouth once daily.    . psyllium seed, with sugar, (FIBER ORAL) Take 2 each by mouth    . docusate (COLACE) 100 MG capsule Take 100 mg by mouth 2 (two) times daily as needed for Constipation (Patient not taking: Reported on 10/24/2023)    . fluticasone propionate (FLONASE) 50 mcg/actuation  nasal spray Place 1 spray into both nostrils 2 (two) times daily (Patient not taking: Reported on 10/24/2023) 16 g 0  . promethazine -dextromethorphan (PROMETHAZINE -DM) 6.25-15 mg/5 mL syrup Take 5 mLs by mouth every 6 (six) hours as needed 120 mL 0  . SYNTHROID  100 mcg tablet Take 100 mcg by mouth every morning before breakfast (0630)     No facility-administered encounter medications on file as of 10/24/2023.     ALLERGIES:   Aspirin and Penicillin   SOCIAL HISTORY:  Social History   Socioeconomic History  . Marital status: Divorced  Tobacco Use  . Smoking status: Never  . Smokeless tobacco: Never  Vaping Use  . Vaping status: Never Used  Substance and Sexual Activity  . Alcohol use: Yes  . Drug use: No  . Sexual activity: Defer   Social Drivers of Health   Financial Resource Strain: Low Risk  (07/31/2023)   Received from [facility]   Overall Financial Resource Strain (CARDIA)   . How hard is it for you to pay for the very basics like food, housing, medical care, and heating?: Not hard at all  Food Insecurity: No Food Insecurity (07/31/2023)   Received from [facility]   Hunger Vital Sign   . Within the past 12 months, you worried that your food would run out before you got the money to buy more.: Never true   . Within the past 12 months, the food  you bought just didn't last and you didn't have money to get more.: Never true  Transportation Needs: No Transportation Needs (07/31/2023)   Received from [facility] - Transportation   . In the past 12 months, has lack of transportation kept you from medical appointments or from getting medications?: No   . In the past 12 months, has lack of transportation kept you from meetings, work, or from getting things needed for daily living?: No    FAMILY HISTORY:  Family History  Problem Relation Name Age of Onset  . No Known Problems Mother       GENERAL REVIEW OF SYSTEMS:   General ROS: negative for - chills, fatigue,  fever, weight gain or weight loss Allergy and Immunology ROS: negative for - hives  Hematological and Lymphatic ROS: negative for - bleeding problems or bruising, negative for palpable nodes Endocrine ROS: negative for - heat or cold intolerance, hair changes Respiratory ROS: negative for - cough, shortness of breath or wheezing Cardiovascular ROS: no chest pain or palpitations GI ROS: negative for nausea, vomiting, abdominal pain, diarrhea, constipation Musculoskeletal ROS: negative for - joint swelling or muscle pain Neurological ROS: negative for - confusion, syncope Dermatological ROS: negative for pruritus and rash  PHYSICAL EXAM:  Vitals:   10/24/23 0942  BP: (!) 148/90  Pulse: 103  .  Ht:170.2 cm (5' 7) Wt:69.4 kg (153 lb) ADJ:Anib surface area is 1.81 meters squared. Body mass index is 23.96 kg/m.SABRA   GENERAL: Alert, active, oriented x3  HEENT: Pupils equal reactive to light. Extraocular movements are intact. Sclera clear. Palpebral conjunctiva normal red color.Pharynx clear.  NECK: Supple with no palpable mass and no adenopathy.  LUNGS: Sound clear with no rales rhonchi or wheezes.  HEART: Regular rhythm S1 and S2 without murmur.  BREAST: Both breast examined in supine position.  No palpable masses, no skin changes, no nipple retraction or nipple discharge.  No axillary adenopathy bilaterally.  EXTREMITIES: Well-developed well-nourished symmetrical with no dependent edema.  NEUROLOGICAL: Awake alert oriented, facial expression symmetrical, moving all extremities.   Results RADIOLOGY Screening Mammogram: Possible asymmetries in the right breast (09/14/2023) Diagnostic Mammogram: Highly suspicious 10 mm mass in the right upper inner breast and a 6 mm mass in the right upper inner breast (09/22/2023) Breast MRI: Two sites of proven malignancy spanning 4.2 cm  PATHOLOGY Core Biopsy: Invasive ductal carcinoma, Grade 3, ER negative, PR negative, HER2 positive     Assessment & Plan Right breast invasive ductal carcinoma, HER2 positive, ER/PR negative   Invasive ductal carcinoma in the right breast is confirmed by core biopsy. The tumor is HER2 positive, ER/PR negative, and measures 4.2 cm on MRI. Neoadjuvant chemotherapy is recommended due to HER2 positivity and tumor size, necessary regardless of surgical choice. Radiation therapy is unlikely if a total mastectomy is performed, unless lymph nodes are positive or there is skin involvement. Sentinel lymph node biopsy is needed to assess lymph node involvement. She is hesitant about chemotherapy due to hair loss concerns but understands its necessity. She is considering a total mastectomy, possibly bilateral, and is not interested in reconstruction. Discuss the chemotherapy plan with medical oncology, including potential neoadjuvant chemotherapy. Consider surgical options: partial mastectomy, total mastectomy, or bilateral mastectomy. Perform sentinel lymph node biopsy during surgery. Discuss potential use of a chemoport for chemotherapy administration. Refer to physical therapy for post-operative rehabilitation and swelling management. Schedule a follow-up with medical oncology to finalize the chemotherapy plan, which most likely recommendation will  be to do neoadjuvant chemotherapy.  Once this is set, we will schedule her for insertion of Port-A-Cath.   Malignant neoplasm of upper-inner quadrant of right breast in female, estrogen receptor negative (CMS/HHS-HCC) [C50.211, Z17.1]  I spent a total of 65 minutes in both face-to-face and non-face-to-face activities, excluding procedures performed, for this visit on the date of this encounter.  Patient and her daughter verbalized understanding, all questions were answered, and were agreeable with the plan outlined above.   [name], MD  Electronically signed by [name], MD

## 2023-10-24 NOTE — Progress Notes (Signed)
 START ON PATHWAY REGIMEN - Breast     Cycle 1: A cycle is 21 days:     Pertuzumab      Trastuzumab-xxxx      Docetaxel      Carboplatin    Cycles 2 through 6: A cycle is every 21 days:     Pertuzumab      Trastuzumab-xxxx      Docetaxel      Carboplatin   **Always confirm dose/schedule in your pharmacy ordering system**  Patient Characteristics: Preoperative or Nonsurgical Candidate, M0 (Clinical Staging), Up to cT4c, Any N, M0, Neoadjuvant Therapy followed by Surgery, Invasive Disease, Chemotherapy, HER2 Positive, ER Negative Therapeutic Status: Preoperative or Nonsurgical Candidate, M0 (Clinical Staging) AJCC M Category: cM0 AJCC Grade: G3 ER Status: Negative (-) AJCC 8 Stage Grouping: IIA HER2 Status: Positive (+) AJCC T Category: cT2 AJCC N Category: cN0 PR Status: Negative (-) Breast Surgical Plan: Neoadjuvant Therapy followed by Surgery Intent of Therapy: Curative Intent, Discussed with Patient

## 2023-10-25 ENCOUNTER — Encounter: Payer: Self-pay | Admitting: *Deleted

## 2023-10-25 ENCOUNTER — Ambulatory Visit
Admission: RE | Admit: 2023-10-25 | Discharge: 2023-10-25 | Disposition: A | Discharge status: Home / Self Care | Source: Ambulatory Visit | Attending: Oncology | Admitting: Oncology

## 2023-10-25 ENCOUNTER — Other Ambulatory Visit: Payer: Self-pay

## 2023-10-25 DIAGNOSIS — Z01818 Encounter for other preprocedural examination: Secondary | ICD-10-CM | POA: Insufficient documentation

## 2023-10-25 DIAGNOSIS — I371 Nonrheumatic pulmonary valve insufficiency: Secondary | ICD-10-CM | POA: Insufficient documentation

## 2023-10-25 DIAGNOSIS — C50911 Malignant neoplasm of unspecified site of right female breast: Secondary | ICD-10-CM | POA: Insufficient documentation

## 2023-10-25 DIAGNOSIS — Z171 Estrogen receptor negative status [ER-]: Secondary | ICD-10-CM | POA: Insufficient documentation

## 2023-10-25 LAB — ECHOCARDIOGRAM COMPLETE
AR max vel: 1.99 cm2
AV Area VTI: 1.77 cm2
AV Area mean vel: 1.96 cm2
AV Mean grad: 3 mmHg
AV Peak grad: 4.7 mmHg
Ao pk vel: 1.08 m/s
Area-P 1/2: 3.42 cm2
Calc EF: 65.6 %
MV VTI: 2.31 cm2
S' Lateral: 1.6 cm
Single Plane A2C EF: 62.1 %
Single Plane A4C EF: 68.6 %

## 2023-10-27 ENCOUNTER — Ambulatory Visit: Payer: Self-pay | Admitting: General Surgery

## 2023-10-31 NOTE — Progress Notes (Signed)
 Pharmacist Chemotherapy Monitoring - Initial Assessment    Anticipated start date: 11/14/23   The following has been reviewed per standard work regarding the patient's treatment regimen: The patient's diagnosis, treatment plan and drug doses, and organ/hematologic function Lab orders and baseline tests specific to treatment regimen  The treatment plan start date, drug sequencing, and pre-medications Prior authorization status  Patient's documented medication list, including drug-drug interaction screen and prescriptions for anti-emetics and supportive care specific to the treatment regimen The drug concentrations, fluid compatibility, administration routes, and timing of the medications to be used The patient's access for treatment and lifetime cumulative dose history, if applicable  The patient's medication allergies and previous infusion related reactions, if applicable   cT12 cN0 right breast invasive ductal carcinoma, G3, ER-, PR-, HER2 3+, Ki 67 50%, stage II  neoadjuvant chemotherapy with TCHP  Changes made to treatment plan:  N/A     Redell JINNY Gaskins, RPH, 10/31/2023  2:10 PM 9/30

## 2023-11-01 ENCOUNTER — Encounter
Admission: RE | Admit: 2023-11-01 | Discharge: 2023-11-01 | Disposition: A | Discharge status: Home / Self Care | Source: Ambulatory Visit | Attending: General Surgery | Admitting: General Surgery

## 2023-11-01 ENCOUNTER — Encounter

## 2023-11-01 ENCOUNTER — Other Ambulatory Visit: Payer: Self-pay

## 2023-11-01 DIAGNOSIS — R000 Tachycardia, unspecified: Secondary | ICD-10-CM

## 2023-11-01 DIAGNOSIS — C50919 Malignant neoplasm of unspecified site of unspecified female breast: Secondary | ICD-10-CM

## 2023-11-01 DIAGNOSIS — Z01810 Encounter for preprocedural cardiovascular examination: Secondary | ICD-10-CM

## 2023-11-01 HISTORY — DX: Malignant (primary) neoplasm, unspecified: C80.1

## 2023-11-01 NOTE — Patient Instructions (Addendum)
 Your procedure is scheduled on: 11/08/23 - Wednesday Report to the Registration Desk on the 1st floor of the [facility]. To find out your arrival time, please call [phone] between 1PM - 3PM on: 11/07/23 - Tuesday If your arrival time is 6:00 am, do not arrive before that time as the [facility] Mall entrance doors do not open until 6:00 am.  REMEMBER: Instructions that are not followed completely may result in serious medical risk, up to and including death; or upon the discretion of your surgeon and anesthesiologist your surgery may need to be rescheduled.  Do not eat food or drink any liquids after midnight the night before surgery.  No gum chewing or hard candies.   One week prior to surgery: Stop Anti-inflammatories (NSAIDS) such as Advil , Aleve, Ibuprofen , Motrin , Naproxen, Naprosyn and Aspirin based products such as Excedrin, Goody's Powder, BC Powder. You continue to take Tylenol  if needed for pain up until the day of surgery.  Stop ANY OVER THE COUNTER supplements until after surgery : Multivitamin, [place]   ON THE DAY OF SURGERY ONLY TAKE THESE MEDICATIONS WITH SIPS OF WATER:  none   No Alcohol for 24 hours before or after surgery.  No Smoking including e-cigarettes for 24 hours before surgery.  No chewable tobacco products for at least 6 hours before surgery.  No nicotine patches on the day of surgery.  Do not use any recreational drugs for at least a week (preferably 2 weeks) before your surgery.  Please be advised that the combination of cocaine and anesthesia may have negative outcomes, up to and including death. If you test positive for cocaine, your surgery will be cancelled.  On the morning of surgery brush your teeth with toothpaste and water, you may rinse your mouth with mouthwash if you wish. Do not swallow any toothpaste or mouthwash.   Do not wear jewelry, make-up, hairpins, clips or nail polish.  For welded (permanent) jewelry: bracelets,  anklets, waist bands, etc.  Please have this removed prior to surgery.  If it is not removed, there is a chance that hospital personnel will need to cut it off on the day of surgery.  Do not wear lotions, powders, or perfumes.   Do not shave body hair from the neck down 48 hours before surgery.  Contact lenses, hearing aids and dentures may not be worn into surgery.  Do not bring valuables to the hospital. [facility] is not responsible for any missing/lost belongings or valuables.   Notify your doctor if there is any change in your medical condition (cold, fever, infection).  Wear comfortable clothing (specific to your surgery type) to the hospital.  After surgery, you can help prevent lung complications by doing breathing exercises.  Take deep breaths and cough every 1-2 hours. Your doctor may order a device called an Incentive Spirometer to help you take deep breaths.  When coughing or sneezing, hold a pillow firmly against your incision with both hands. This is called "splinting." Doing this helps protect your incision. It also decreases belly discomfort.  If you are being admitted to the hospital overnight, leave your suitcase in the car. After surgery it may be brought to your room.  In case of increased patient census, it may be necessary for you, the patient, to continue your postoperative care in the Same Day Surgery department.  If you are being discharged the day of surgery, you will not be allowed to drive home. You will need a responsible individual to drive  you home and stay with you for 24 hours after surgery.   If you are taking public transportation, you will need to have a responsible individual with you.  Please call the Pre-admissions Testing Dept. at [phone] if you have any questions about these instructions.  Surgery Visitation Policy:  Patients having surgery or a procedure may have two visitors.  Children under the age of 32 must have an adult with them  who is not the patient.  Inpatient Visitation:    Visiting hours are 7 a.m. to 8 p.m. Up to four visitors are allowed at one time in a patient room. The visitors may rotate out with other people during the day.  One visitor age 28 or older may stay with the patient overnight and must be in the room by 8 p.m.   Merchandiser, retail to address health-related social needs:  [email or web address]                                                                                                             Preparing for Surgery with CHLORHEXIDINE  GLUCONATE (CHG) Soap  Chlorhexidine  Gluconate (CHG) Soap  o An antiseptic cleaner that kills germs and bonds with the skin to continue killing germs even after washing  o Used for showering the night before surgery and morning of surgery  Before surgery, you can play an important role by reducing the number of germs on your skin.  CHG (Chlorhexidine  gluconate) soap is an antiseptic cleanser which kills germs and bonds with the skin to continue killing germs even after washing.  Please do not use if you have an allergy to CHG or antibacterial soaps. If your skin becomes reddened/irritated stop using the CHG.  1. Shower the NIGHT BEFORE SURGERY and the MORNING OF SURGERY with CHG soap.  2. If you choose to wash your hair, wash your hair first as usual with your normal shampoo.  3. After shampooing, rinse your hair and body thoroughly to remove the shampoo.  4. Use CHG as you would any other liquid soap. You can apply CHG directly to the skin and wash gently with a scrungie or a clean washcloth.  5. Apply the CHG soap to your body only from the neck down. Do not use on open wounds or open sores. Avoid contact with your eyes, ears, mouth, and genitals (private parts). Wash face and genitals (private parts) with your normal soap.  6. Wash thoroughly, paying special attention to the area where your surgery will be performed.  7.  Thoroughly rinse your body with warm water.  8. Do not shower/wash with your normal soap after using and rinsing off the CHG soap.  9. Pat yourself dry with a clean towel.  10. Wear clean pajamas to bed the night before surgery.  12. Place clean sheets on your bed the night of your first shower and do not sleep with pets.  13. Shower again with the CHG soap on the day of surgery prior to arriving at the hospital.  14. Do not  apply any deodorants/lotions/powders.  15. Please wear clean clothes to the hospital.

## 2023-11-02 ENCOUNTER — Inpatient Hospital Stay

## 2023-11-02 ENCOUNTER — Encounter
Admission: RE | Admit: 2023-11-02 | Discharge: 2023-11-02 | Disposition: A | Discharge status: Home / Self Care | Source: Ambulatory Visit | Attending: General Surgery | Admitting: General Surgery

## 2023-11-02 DIAGNOSIS — R000 Tachycardia, unspecified: Secondary | ICD-10-CM | POA: Insufficient documentation

## 2023-11-02 DIAGNOSIS — Z01810 Encounter for preprocedural cardiovascular examination: Secondary | ICD-10-CM | POA: Insufficient documentation

## 2023-11-02 NOTE — Progress Notes (Signed)
 [facility] CSW Progress Note  Clinical Social Work introduced self to patient during Patient Education with Raoul Moats, Charity fundraiser.  Provided information regarding CSW role, including counseling, advanced care planning and support group.  Answered questions as needed.  Follow Up Plan:  CSW will follow-up with patient by phone     Macario CHRISTELLA Au, LCSW Clinical Social Worker [facility]

## 2023-11-07 ENCOUNTER — Encounter: Payer: Self-pay | Admitting: Oncology

## 2023-11-07 ENCOUNTER — Telehealth: Payer: Self-pay

## 2023-11-07 NOTE — Telephone Encounter (Signed)
 Shasta HERO, RN brings intermittent FMLA request for Dr. Babara.

## 2023-11-07 NOTE — Telephone Encounter (Signed)
 Form completed, pending Dr. Babara signature.

## 2023-11-07 NOTE — Telephone Encounter (Signed)
 Paperwork has been completed and will give to Shasta HERO, RN to provide to patient

## 2023-11-08 ENCOUNTER — Ambulatory Visit: Payer: Self-pay | Admitting: Urgent Care

## 2023-11-08 ENCOUNTER — Ambulatory Visit
Admission: RE | Admit: 2023-11-08 | Discharge: 2023-11-08 | Disposition: A | Discharge status: Home / Self Care | Attending: General Surgery | Admitting: General Surgery

## 2023-11-08 ENCOUNTER — Other Ambulatory Visit: Payer: Self-pay

## 2023-11-08 ENCOUNTER — Ambulatory Visit

## 2023-11-08 ENCOUNTER — Encounter
Admission: RE | Disposition: A | Discharge status: Home / Self Care | Payer: Self-pay | Source: Home / Self Care | Attending: General Surgery

## 2023-11-08 ENCOUNTER — Encounter: Payer: Self-pay | Admitting: General Surgery

## 2023-11-08 ENCOUNTER — Ambulatory Visit: Admitting: Certified Registered"

## 2023-11-08 DIAGNOSIS — E119 Type 2 diabetes mellitus without complications: Secondary | ICD-10-CM | POA: Insufficient documentation

## 2023-11-08 DIAGNOSIS — E039 Hypothyroidism, unspecified: Secondary | ICD-10-CM | POA: Insufficient documentation

## 2023-11-08 DIAGNOSIS — Z1731 Human epidermal growth factor receptor 2 positive status: Secondary | ICD-10-CM | POA: Diagnosis not present

## 2023-11-08 DIAGNOSIS — Z79890 Hormone replacement therapy (postmenopausal): Secondary | ICD-10-CM | POA: Insufficient documentation

## 2023-11-08 DIAGNOSIS — C50211 Malignant neoplasm of upper-inner quadrant of right female breast: Secondary | ICD-10-CM | POA: Diagnosis present

## 2023-11-08 DIAGNOSIS — Z1722 Progesterone receptor negative status: Secondary | ICD-10-CM | POA: Insufficient documentation

## 2023-11-08 DIAGNOSIS — Z171 Estrogen receptor negative status [ER-]: Secondary | ICD-10-CM | POA: Diagnosis not present

## 2023-11-08 HISTORY — PX: PORTACATH PLACEMENT: SHX2246

## 2023-11-08 SURGERY — INSERTION, TUNNELED CENTRAL VENOUS DEVICE, WITH PORT
Anesthesia: General | Site: Chest

## 2023-11-08 MED ORDER — PROPOFOL 500 MG/50ML IV EMUL
INTRAVENOUS | Status: DC | PRN
  Administered 2023-11-08: 100 ug/kg/min via INTRAVENOUS

## 2023-11-08 MED ORDER — PROPOFOL 10 MG/ML IV BOLUS
INTRAVENOUS | Status: DC | PRN
  Administered 2023-11-08: 50 mg via INTRAVENOUS

## 2023-11-08 MED ORDER — HEPARIN SODIUM (PORCINE) 5000 UNIT/ML IJ SOLN
INTRAMUSCULAR | Status: AC
  Filled 2023-11-08: qty 1

## 2023-11-08 MED ORDER — FENTANYL CITRATE (PF) 100 MCG/2ML IJ SOLN: 25.0000 ug | INTRAMUSCULAR | Status: DC | PRN

## 2023-11-08 MED ORDER — ONDANSETRON HCL 4 MG/2ML IJ SOLN
INTRAMUSCULAR | Status: AC
  Filled 2023-11-08: qty 2

## 2023-11-08 MED ORDER — SODIUM CHLORIDE (PF) 0.9 % IJ SOLN
INTRAMUSCULAR | Status: AC
  Filled 2023-11-08: qty 50

## 2023-11-08 MED ORDER — MIDAZOLAM HCL 2 MG/2ML IJ SOLN
INTRAMUSCULAR | Status: DC | PRN
  Administered 2023-11-08: 2 mg via INTRAVENOUS

## 2023-11-08 MED ORDER — ONDANSETRON HCL 4 MG/2ML IJ SOLN
INTRAMUSCULAR | Status: DC | PRN
  Administered 2023-11-08: 4 mg via INTRAVENOUS

## 2023-11-08 MED ORDER — PHENYLEPHRINE 80 MCG/ML (10ML) SYRINGE FOR IV PUSH (FOR BLOOD PRESSURE SUPPORT)
PREFILLED_SYRINGE | INTRAVENOUS | Status: DC | PRN
  Administered 2023-11-08: 80 ug via INTRAVENOUS

## 2023-11-08 MED ORDER — CEFAZOLIN SODIUM-DEXTROSE 2-4 GM/100ML-% IV SOLN
INTRAVENOUS | Status: AC
  Filled 2023-11-08: qty 100

## 2023-11-08 MED ORDER — CEFAZOLIN SODIUM-DEXTROSE 2-4 GM/100ML-% IV SOLN
2.0000 g | INTRAVENOUS | Status: AC
Start: 2023-11-08 — End: 2023-11-08
  Administered 2023-11-08: 2 g via INTRAVENOUS

## 2023-11-08 MED ORDER — DIPHENHYDRAMINE HCL 50 MG/ML IJ SOLN
INTRAMUSCULAR | Status: DC | PRN
  Administered 2023-11-08 (×2): 12.5 mg via INTRAVENOUS

## 2023-11-08 MED ORDER — CHLORHEXIDINE GLUCONATE 0.12 % MT SOLN
OROMUCOSAL | Status: AC
  Filled 2023-11-08: qty 15

## 2023-11-08 MED ORDER — BUPIVACAINE-EPINEPHRINE (PF) 0.25% -1:200000 IJ SOLN
INTRAMUSCULAR | Status: AC
  Filled 2023-11-08: qty 30

## 2023-11-08 MED ORDER — FENTANYL CITRATE (PF) 100 MCG/2ML IJ SOLN
INTRAMUSCULAR | Status: DC | PRN
  Administered 2023-11-08: 50 ug via INTRAVENOUS
  Administered 2023-11-08 (×2): 25 ug via INTRAVENOUS

## 2023-11-08 MED ORDER — LACTATED RINGERS IV SOLN
INTRAVENOUS | Status: DC
Start: 2023-11-08 — End: 2023-11-08

## 2023-11-08 MED ORDER — PROPOFOL 1000 MG/100ML IV EMUL
INTRAVENOUS | Status: AC
  Filled 2023-11-08: qty 100

## 2023-11-08 MED ORDER — LIDOCAINE HCL (PF) 2 % IJ SOLN
INTRAMUSCULAR | Status: DC | PRN
  Administered 2023-11-08: 80 mg via INTRADERMAL

## 2023-11-08 MED ORDER — DEXMEDETOMIDINE HCL IN NACL 80 MCG/20ML IV SOLN
INTRAVENOUS | Status: DC | PRN
  Administered 2023-11-08 (×2): 4 ug via INTRAVENOUS

## 2023-11-08 MED ORDER — FENTANYL CITRATE (PF) 100 MCG/2ML IJ SOLN
INTRAMUSCULAR | Status: AC
  Filled 2023-11-08: qty 2

## 2023-11-08 MED ORDER — ORAL CARE MOUTH RINSE: 15.0000 mL | Freq: Once | OROMUCOSAL | Status: AC

## 2023-11-08 MED ORDER — DEXAMETHASONE SODIUM PHOSPHATE 10 MG/ML IJ SOLN
INTRAMUSCULAR | Status: AC
  Filled 2023-11-08: qty 1

## 2023-11-08 MED ORDER — PROPOFOL 10 MG/ML IV BOLUS
INTRAVENOUS | Status: AC
  Filled 2023-11-08: qty 20

## 2023-11-08 MED ORDER — MIDAZOLAM HCL 2 MG/2ML IJ SOLN
INTRAMUSCULAR | Status: AC
  Filled 2023-11-08: qty 2

## 2023-11-08 MED ORDER — DIPHENHYDRAMINE HCL 50 MG/ML IJ SOLN
INTRAMUSCULAR | Status: AC
  Filled 2023-11-08: qty 1

## 2023-11-08 MED ORDER — LIDOCAINE HCL (PF) 2 % IJ SOLN
INTRAMUSCULAR | Status: AC
  Filled 2023-11-08: qty 5

## 2023-11-08 MED ORDER — SODIUM CHLORIDE 0.9 % IV SOLN
INTRAVENOUS | Status: DC | PRN
  Administered 2023-11-08: 9 mL via INTRAMUSCULAR

## 2023-11-08 MED ORDER — BUPIVACAINE-EPINEPHRINE (PF) 0.25% -1:200000 IJ SOLN
INTRAMUSCULAR | Status: DC | PRN
  Administered 2023-11-08: 7 mL

## 2023-11-08 MED ORDER — DEXAMETHASONE SODIUM PHOSPHATE 10 MG/ML IJ SOLN
INTRAMUSCULAR | Status: DC | PRN
  Administered 2023-11-08: 10 mg via INTRAVENOUS

## 2023-11-08 MED ORDER — CHLORHEXIDINE GLUCONATE 0.12 % MT SOLN
15.0000 mL | Freq: Once | OROMUCOSAL | Status: AC
  Administered 2023-11-08: 15 mL via OROMUCOSAL

## 2023-11-08 SURGICAL SUPPLY — 26 items
BAG DECANTER FOR FLEXI CONT (MISCELLANEOUS) ×1 IMPLANT
BLADE SURG 15 STRL LF DISP TIS (BLADE) ×1 IMPLANT
BLADE SURG SZ11 CARB STEEL (BLADE) ×1 IMPLANT
CLAMP SUTURE YELLOW 5 PAIRS (MISCELLANEOUS) ×1 IMPLANT
DERMABOND ADVANCED .7 DNX12 (GAUZE/BANDAGES/DRESSINGS) ×1 IMPLANT
DRAPE C-ARM XRAY 36X54 (DRAPES) ×1 IMPLANT
ELECTRODE REM PT RTRN 9FT ADLT (ELECTROSURGICAL) ×1 IMPLANT
GLOVE BIO SURGEON STRL SZ 6.5 (GLOVE) ×1 IMPLANT
GLOVE BIOGEL PI IND STRL 6.5 (GLOVE) ×1 IMPLANT
GLOVE SURG SYN 6.5 PF PI (GLOVE) ×2 IMPLANT
GOWN STRL REUS W/ TWL LRG LVL3 (GOWN DISPOSABLE) ×3 IMPLANT
IV NS 500ML BAXH (IV SOLUTION) ×1 IMPLANT
KIT PORT INFUSION SMART 8FR (Port) ×1 IMPLANT
KIT TURNOVER KIT A (KITS) ×1 IMPLANT
LABEL OR SOLS (LABEL) ×1 IMPLANT
MANIFOLD NEPTUNE II (INSTRUMENTS) ×1 IMPLANT
NDL FILTER BLUNT 18X1 1/2 (NEEDLE) ×1 IMPLANT
NEEDLE FILTER BLUNT 18X1 1/2 (NEEDLE) ×1 IMPLANT
PACK PORT-A-CATH (MISCELLANEOUS) ×1 IMPLANT
SUT MNCRL AB 4-0 PS2 18 (SUTURE) ×1 IMPLANT
SUT PROLENE 2 0 FS (SUTURE) ×1 IMPLANT
SUT VIC AB 3-0 SH 27X BRD (SUTURE) ×1 IMPLANT
SYR 10ML LL (SYRINGE) ×2 IMPLANT
SYR 3ML LL SCALE MARK (SYRINGE) ×1 IMPLANT
TRAP FLUID SMOKE EVACUATOR (MISCELLANEOUS) ×1 IMPLANT
WATER STERILE IRR 500ML POUR (IV SOLUTION) ×1 IMPLANT

## 2023-11-08 NOTE — Transfer of Care (Signed)
 Immediate Anesthesia Transfer of Care Note  Patient: Annette Phillips  Procedure(s) Performed: INSERTION, TUNNELED CENTRAL VENOUS DEVICE, WITH PORT (Chest)  Patient Location: PACU  Anesthesia Type:General  Level of Consciousness: awake, drowsy, and patient cooperative  Airway & Oxygen Therapy: Patient Spontanous Breathing  Post-op Assessment: Report given to RN and Post -op Vital signs reviewed and stable  Post vital signs: Reviewed and stable  Last Vitals:  Vitals Value Taken Time  BP 122/84 11/08/23 13:42  Temp    Pulse 84 11/08/23 13:44  Resp 14 11/08/23 13:44  SpO2 98 % 11/08/23 13:44  Vitals shown include unfiled device data.  Last Pain:  Vitals:   11/08/23 1147  TempSrc: Temporal  PainSc: 0-No pain         Complications: No notable events documented.

## 2023-11-08 NOTE — Anesthesia Preprocedure Evaluation (Signed)
 Anesthesia Evaluation  Patient identified by MRN, date of birth, ID band Patient awake    Reviewed: Allergy & Precautions, NPO status , Patient's Chart, lab work & pertinent test results  Airway Mallampati: II  TM Distance: >3 FB Neck ROM: full    Dental  (+) Chipped, Dental Advidsory Given   Pulmonary neg pulmonary ROS   Pulmonary exam normal        Cardiovascular negative cardio ROS Normal cardiovascular exam     Neuro/Psych negative neurological ROS  negative psych ROS   GI/Hepatic negative GI ROS, Neg liver ROS,,,  Endo/Other  diabetes (borderline)Hypothyroidism    Renal/GU negative Renal ROS     Musculoskeletal   Abdominal   Peds  Hematology negative hematology ROS (+)   Anesthesia Other Findings Past Medical History: No date: Allergy     Comment:  Penicillin, Aspirin July 2023: Anemia July 2023: Blood transfusion without reported diagnosis No date: Goiter No date: Hypothyroid 12/2022: Internal hemorrhoid, bleeding No date: Iron  deficiency anemia No date: Pre-diabetes 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 No date: Sinus tachycardia  Past Surgical History: No date: APPENDECTOMY 05/21/2009: BREAST BIOPSY; Right     Comment:  benign 02/28/2019: CHOLECYSTECTOMY; N/A     Comment:  Procedure: LAPAROSCOPIC CHOLECYSTECTOMY;  Surgeon:               Desiderio Schanz, MD;  Location: [facility] ORS;  Service:               General;  Laterality: N/A; 09/14/2021: COLONOSCOPY WITH PROPOFOL ; N/A     Comment:  Procedure: COLONOSCOPY WITH PROPOFOL ;  Surgeon: Jinny Carmine, MD;  Location: [facility] ENDOSCOPY;  Service:               Endoscopy;  Laterality: N/A; 06/2022: DE QUERVAIN'S RELEASE; Right 02/27/2019: ESOPHAGOGASTRODUODENOSCOPY (EGD) WITH PROPOFOL ; N/A     Comment:  Procedure: ESOPHAGOGASTRODUODENOSCOPY (EGD) WITH               PROPOFOL ;  Surgeon: Unk Corinn Skiff, MD;  Location:               [facility] ENDOSCOPY;   Service: Gastroenterology;  Laterality:               N/A; 09/14/2021: ESOPHAGOGASTRODUODENOSCOPY (EGD) WITH PROPOFOL ; N/A     Comment:  Procedure: ESOPHAGOGASTRODUODENOSCOPY (EGD) WITH               PROPOFOL ;  Surgeon: Jinny Carmine, MD;  Location: [facility]               ENDOSCOPY;  Service: Endoscopy;  Laterality: N/A; 09/15/2021: GIVENS CAPSULE STUDY; N/A     Comment:  Procedure: GIVENS CAPSULE STUDY;  Surgeon: Jinny Carmine,              MD;  Location: [facility] ENDOSCOPY;  Service: Endoscopy;                Laterality: N/A; 10/20/2022: HEMORRHOID BANDING 01/04/2007: KNEE ARTHROSCOPY; Left     Comment:  torn meniscus 09/2022: VITRECTOMY; Right  BMI    Body Mass Index: 24.28 kg/m      Reproductive/Obstetrics negative OB ROS                              Anesthesia Physical Anesthesia Plan  ASA: 2  Anesthesia Plan: General   Post-op Pain Management:    Induction:  Intravenous  PONV Risk Score and Plan: 3 and Propofol  infusion and TIVA  Airway Management Planned: Natural Airway and Simple Face Mask  Additional Equipment:   Intra-op Plan:   Post-operative Plan:   Informed Consent: I have reviewed the patients History and Physical, chart, labs and discussed the procedure including the risks, benefits and alternatives for the proposed anesthesia with the patient or authorized representative who has indicated his/her understanding and acceptance.     Dental Advisory Given  Plan Discussed with: Anesthesiologist, CRNA and Surgeon  Anesthesia Plan Comments: (Patient consented for risks of anesthesia including but not limited to:  - adverse reactions to medications - damage to eyes, teeth, lips or other oral mucosa - nerve damage due to positioning  - sore throat or hoarseness - Damage to heart, brain, nerves, lungs, other parts of body or loss of life  Patient voiced understanding and assent.)        Anesthesia Quick Evaluation

## 2023-11-08 NOTE — Anesthesia Procedure Notes (Signed)
 Procedure Name: MAC Date/Time: 11/08/2023 12:40 PM  Performed by: Lacretia Camelia NOVAK, CRNAPre-anesthesia Checklist: Patient identified, Emergency Drugs available, Suction available and Patient being monitored Patient Re-evaluated:Patient Re-evaluated prior to induction Oxygen Delivery Method: Simple face mask

## 2023-11-08 NOTE — Interval H&P Note (Signed)
 History and Physical Interval Note:  11/08/2023 12:42 PM  Annette Phillips  has presented today for surgery, with the diagnosis of C50.211 Z17.1 Malignant neoplasm of upper inner quadrant of rt breast in female, estrogen receptor positive.  The various methods of treatment have been discussed with the patient and family. After consideration of risks, benefits and other options for treatment, the patient has consented to  Procedure(s): INSERTION, TUNNELED CENTRAL VENOUS DEVICE, WITH PORT (N/A) as a surgical intervention.  The patient's history has been reviewed, patient examined, no change in status, stable for surgery.  I have reviewed the patient's chart and labs.  Questions were answered to the patient's satisfaction.     [name]

## 2023-11-08 NOTE — Op Note (Signed)
 SURGICAL PROCEDURE REPORT  DATE OF PROCEDURE: 11/08/2023   SURGEON: Dr. Cesar Coe   ANESTHESIA: Local with light IV sedation   PRE-OPERATIVE DIAGNOSIS: Advanced breast cancer requiring durable central venous access for chemotherapy   POST-OPERATIVE DIAGNOSIS: Same  PROCEDURE(S): (cpt: 36561) 1.) Percutaneous access of Left internal jugular vein under ultrasound guidance 2.) Insertion of tunneled Left internal jugular central venous catheter with subcutaneous port  INTRAOPERATIVE FINDINGS: Patent easily compressible Left internal jugular vein with appropriate respiratory variations and well-secured tunneled central venous catheter with subcutaneous port at completion of the procedure  ESTIMATED BLOOD LOSS: Minimal (<20 mL)   SPECIMENS: None   IMPLANTS: 15F tunneled Bard PowerPort central venous catheter with subcutaneous port  DRAINS: None   COMPLICATIONS: None apparent   CONDITION AT COMPLETION: Hemodynamically stable, awake   DISPOSITION: PACU   INDICATION(S) FOR PROCEDURE:  Patient is a 58 y.o. female who presented with advanced breast cancer requiring durable central venous access for chemotherapy. All risks, benefits, and alternatives to above elective procedures were discussed with the patient, who elected to proceed, and informed consent was accordingly obtained at that time.  DETAILS OF PROCEDURE:  Patient was brought to the operative suite and appropriately identified. In Trendelenburg position, Left IJ venous access site was prepped and draped in the usual sterile fashion, and following a brief timeout, percutaneous Left IJ venous access was obtained under ultrasound guidance using Seldinger technique, by which local anesthetic was injected over the Left IJ vein, and access needle was inserted under direct ultrasound visualization into the Left IJ vein, through which soft guidewire was advanced, over which access needle was withdrawn. Guidewire was secured, attention  was directed to injection of local anesthetic along the planned tunnel site, 2-3 cm transverse Left chest incision was made and confirmed to accommodate the subcutaneous port, and flushed catheter was tunneled retrograde from the port site over the Left chest to the Left IJ access site with the attached port well-secured to the catheter and within the subcutaneous pocket. Insertion sheath was advanced over the guidewire, which was withdrawn along with the insertion sheath dilator. The catheter was introduced through the sheath and left on the [place] junction under fluoro guidance and catheter cut to desire lenght. Catheter connected to port and fixed to the pocket on two side to avoid twisting. Port was confirmed to withdraw blood and flush easily, after which concentrated heparin  was instilled into the port and catheter. Dermis at the subcutaneous pocket was re-approximated using buried interrupted 3-0 Vicryl suture, and 4-0 Monocryl suture was used to re-approximate skin at the insertion and subcutaneous port sites in running subcuticular fashion for the subcutaneous port and buried interrupted fashion for the insertion site. Skin was cleaned, dried, and sterile skin glue was applied. Patient was then safely transferred to PACU for a chest x-ray. Ultrasound images are available on paper chart and Fluoroscopy guidance images are available in Epic.

## 2023-11-09 ENCOUNTER — Encounter: Payer: Self-pay | Admitting: General Surgery

## 2023-11-10 ENCOUNTER — Ambulatory Visit: Admitting: Oncology

## 2023-11-10 ENCOUNTER — Encounter: Payer: Self-pay | Admitting: *Deleted

## 2023-11-10 ENCOUNTER — Encounter: Payer: Self-pay | Admitting: Oncology

## 2023-11-10 ENCOUNTER — Other Ambulatory Visit

## 2023-11-10 NOTE — Progress Notes (Signed)
 Annette Phillips needed further information to accompany her FMLA.  Letter written and emailed to her detailing treatment schedule.

## 2023-11-13 ENCOUNTER — Inpatient Hospital Stay: Admitting: Oncology

## 2023-11-13 ENCOUNTER — Encounter: Payer: Self-pay | Admitting: Oncology

## 2023-11-13 ENCOUNTER — Ambulatory Visit

## 2023-11-13 ENCOUNTER — Inpatient Hospital Stay

## 2023-11-13 VITALS — BP 135/87 | HR 104 | Temp 97.6°F | Resp 18 | Wt 156.3 lb

## 2023-11-13 DIAGNOSIS — Z95828 Presence of other vascular implants and grafts: Secondary | ICD-10-CM | POA: Insufficient documentation

## 2023-11-13 DIAGNOSIS — Z5111 Encounter for antineoplastic chemotherapy: Secondary | ICD-10-CM | POA: Insufficient documentation

## 2023-11-13 DIAGNOSIS — R000 Tachycardia, unspecified: Secondary | ICD-10-CM

## 2023-11-13 DIAGNOSIS — C50919 Malignant neoplasm of unspecified site of unspecified female breast: Secondary | ICD-10-CM

## 2023-11-13 LAB — CMP (CANCER CENTER ONLY)
ALT: 25 U/L (ref 0–44)
AST: 23 U/L (ref 15–41)
Albumin: 4.1 g/dL (ref 3.5–5.0)
Alkaline Phosphatase: 53 U/L (ref 38–126)
Anion gap: 11 (ref 5–15)
BUN: 21 mg/dL — ABNORMAL HIGH (ref 6–20)
CO2: 22 mmol/L (ref 22–32)
Calcium: 9.4 mg/dL (ref 8.9–10.3)
Chloride: 103 mmol/L (ref 98–111)
Creatinine: 0.75 mg/dL (ref 0.44–1.00)
GFR, Estimated: >60 mL/min (ref >60)
Glucose, Bld: 117 mg/dL — ABNORMAL HIGH (ref 70–99)
Potassium: 3.9 mmol/L (ref 3.5–5.1)
Sodium: 136 mmol/L (ref 135–145)
Total Bilirubin: 0.6 mg/dL (ref 0.0–1.2)
Total Protein: 7 g/dL (ref 6.5–8.1)

## 2023-11-13 LAB — CBC WITH DIFFERENTIAL (CANCER CENTER ONLY)
Abs Immature Granulocytes: 0.03 K/uL (ref 0.00–0.07)
Basophils Absolute: 0 K/uL (ref 0.0–0.1)
Basophils Relative: 0 %
Eosinophils Absolute: 0.2 K/uL (ref 0.0–0.5)
Eosinophils Relative: 2 %
HCT: 41.2 % (ref 36.0–46.0)
Hemoglobin: 14.2 g/dL (ref 12.0–15.0)
Immature Granulocytes: 0 %
Lymphocytes Relative: 15 %
Lymphs Abs: 1.2 K/uL (ref 0.7–4.0)
MCH: 30.5 pg (ref 26.0–34.0)
MCHC: 34.5 g/dL (ref 30.0–36.0)
MCV: 88.4 fL (ref 80.0–100.0)
Monocytes Absolute: 0.5 K/uL (ref 0.1–1.0)
Monocytes Relative: 7 %
Neutro Abs: 6 K/uL (ref 1.7–7.7)
Neutrophils Relative %: 76 %
Platelet Count: 189 K/uL (ref 150–400)
RBC: 4.66 MIL/uL (ref 3.87–5.11)
RDW: 12.8 % (ref 11.5–15.5)
WBC Count: 8 K/uL (ref 4.0–10.5)
nRBC: 0 % (ref 0.0–0.2)

## 2023-11-13 MED ORDER — LOPERAMIDE HCL 2 MG PO CAPS: 2.0000 mg | ORAL_CAPSULE | ORAL | 2 refills | Status: AC

## 2023-11-13 NOTE — Assessment & Plan Note (Signed)
 Mild erythema at the port site without any drainage. Likely normal surgical healing process with inflammation. monitored closely for any changes

## 2023-11-13 NOTE — Progress Notes (Signed)
 Hematology/Oncology Progress note [phone] [phone]       CHIEF COMPLAINTS/REASON FOR VISIT:  Stage IIA HER2 positive breast cancer.  ASSESSMENT & PLAN:   Cancer Staging  Invasive carcinoma of breast (HCC) Staging form: Breast, AJCC 8th Edition - Clinical stage from 10/13/2023: Stage IIA (cT2, cN0, cM0, G3, ER-, PR-, HER2+) - Signed by [name], MD on 10/24/2023   Invasive carcinoma of breast [facility]) MRI breast results were reviewed and discussed with patient. Breast mass sites are.  And 2 sites spans a total distance of 4.2 cm.  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  (She is not interested in scalp cooling)  Labs are reviewed and discussed with patient. Proceed with cycle 1 TCHP with D3 GCSF. Recommend Claritin 10mg  daily x 4 days,   Encounter for antineoplastic chemotherapy Chemotherapy treatments as planned.   Sinus tachycardia She is asymptomatic, possibly due to anxiety associated with cancer diagnosis 10/25/2023 LVEF 65-70%  Grade I diastolic dysfunction Monitor. Consider cardiology evaluation if progressive.   Port-A-Cath in place Mild erythema at the port site without any drainage. Likely normal surgical healing process with inflammation. monitored closely for any changes  Patient declined influenza vaccination   Orders Placed This Encounter  Procedures   CBC with Differential (Cancer Center Only)    Standing Status:   Future    Expected Date:   12/04/2023    Expiration Date:   12/03/2024   CMP (Cancer Center only)    Standing Status:   Future    Expected Date:   12/04/2023    Expiration Date:   12/03/2024   CBC with Differential (Cancer Center Only)    Standing Status:   Future    Expected Date:   12/25/2023    Expiration Date:   12/24/2024   CMP (Cancer Center only)    Standing Status:   Future    Expected Date:   12/25/2023     Expiration Date:   12/24/2024    Follow up 1 week lab MD IVF 3 weeks lab MD chemo Patient all questions were answered. The patient knows to call the clinic with any problems, questions or concerns.  Call Babara, MD, PhD [facility] Hematology Oncology 11/13/2023     HISTORY OF PRESENTING ILLNESS:  Annette Phillips is a  58 y.o.  female with PMH listed below who presents for follow up   History of iron  deficiency anemia.  09/14/21 EGD: Small hiatal hernia, nonobstructing Schatzki ring, stomach and duodenum normal.  Colonoscopy: Bleeding hemorrhoids grade 3.  10/08/21 capsule study negative.  S/p hemorrhoid banding in Sept 2024, recently started to have rectal bleeding.   + fatigue. + right side abdomen discomfort, intermittently. Recent US  showed no acute finding. Fatty liver disease. History of cholecystectomy.  She denies recent chest pain on exertion, pre-syncopal episodes, or palpitations She works as Careers adviser  Oncology History  Invasive carcinoma of breast (HCC)  09/18/2023 Mammogram   Bilateral screening mammogram  In the right breast, possible asymmetries warrant further evaluation. In the left breast, no findings suspicious for malignancy.   09/22/2023 Mammogram   Right breast diagnostic mammogram   1. There is a highly suspicious 10 mm mass in the RIGHT upper inner breast. Recommend ultrasound-guided biopsy for definitive characterization. 2. There is a sonographically identified additional 6 mm mass in the RIGHT upper inner breast which is indeterminate. Recommend ultrasound-guided  biopsy for definitive characterization. 3. No suspicious RIGHT axillary adenopathy.   09/28/2023 Initial Diagnosis   Invasive carcinoma of breast (HCC)  S/p right breast biopsy.   1. Breast, right, needle core biopsy, 2:00 6cmfn 10mm (ribbon clip) - INVASIVE DUCTAL CARCINOMA, SEE NOTE - TUBULE FORMATION: SCORE 3 - NUCLEAR PLEOMORPHISM: SCORE 3 - MITOTIC COUNT:  SCORE 3 - TOTAL SCORE: 9 - OVERALL GRADE: 3 - LYMPHOVASCULAR INVASION: NOT IDENTIFIED - CANCER LENGTH: 0.7 CM - CALCIFICATIONS: NOT IDENTIFIED - OTHER FINDINGS: NONE 2. Breast, right, needle core biopsy, 1:00 3cmfn 6mm (venus clip) - INVASIVE DUCTAL CARCINOMA, SEE NOTE - TUBULE FORMATION: SCORE 3 - NUCLEAR PLEOMORPHISM: SCORE 3 - MITOTIC COUNT: SCORE 3 - TOTAL SCORE: 9 - OVERALL GRADE: 3 - LYMPHOVASCULAR INVASION: NOT IDENTIFIED - CANCER LENGTH: 0.5 CM - CALCIFICATIONS: NOT IDENTIFIED - OTHER FINDINGS: NONE  1. 1) Breast, right, needle core biopsy, 2:00 6 cmfn 10mm ( ribbon clip) PROGNOSTIC INDICATORS Results: IMMUNOHISTOCHEMICAL AND MORPHOMETRIC ANALYSIS PERFORMED MANUALLY The tumor cells are POSITIVE for Her2 (3+). Estrogen Receptor: 0%, NEGATIVE Progesterone Receptor: 0%, NEGATIVE Proliferation Marker Ki67: 50%     10/13/2023 Cancer Staging   Staging form: Breast, AJCC 8th Edition - Clinical stage from 10/13/2023: Stage IIA (cT2, cN0, cM0, G3, ER-, PR-, HER2+) - Signed by [name], MD on 10/24/2023 Stage prefix: Initial diagnosis Histologic grading system: 3 grade system   10/20/2023 Imaging   Bilateral MRI breast with and without contrast showed 1. Two sites of UPPER-OUTER RIGHT breast biopsy-proven malignancy spanning a total distance of 4.2 cm (see above). 2. No other MR evidence of malignancy within either breast. No abnormal appearing lymph nodes.   11/13/2023 -  Chemotherapy   Patient is on Treatment Plan : BREAST  Docetaxel + Carboplatin + Trastuzumab + Pertuzumab  (TCHP) q21d       Patient presents for evaluation of neoadjuvant chemotherapy plan.   She has had medi port placed. There is some erythematous change of the medi port site. No fever, no drainage.    MEDICAL HISTORY:  Past Medical History:  Diagnosis Date   Allergy    Penicillin, Aspirin   Anemia July 2023   Blood transfusion without reported diagnosis July 2023   Cancer [facility])    Goiter     Hypothyroid    Internal hemorrhoid, bleeding 12/2022   Iron  deficiency anemia    Pre-diabetes    Recurrent cold sores    Sinus tachycardia     SURGICAL HISTORY: Past Surgical History:  Procedure Laterality Date   APPENDECTOMY     BREAST BIOPSY Right 05/21/2009   benign   BREAST BIOPSY Right 09/28/2023   US  RT BREAST BX W LOC DEV 1ST LESION IMG BX SPEC US  GUIDE 09/28/2023 [facility]   BREAST BIOPSY Right 09/28/2023   US  RT BREAST BX W LOC DEV EA ADD LESION IMG BX SPEC US  GUIDE 09/28/2023 [facility]   CHOLECYSTECTOMY N/A 02/28/2019   Procedure: LAPAROSCOPIC CHOLECYSTECTOMY;  Surgeon: Desiderio Schanz, MD;  Location: [facility];  Service: General;  Laterality: N/A;   COLONOSCOPY WITH PROPOFOL  N/A 09/14/2021   Procedure: COLONOSCOPY WITH PROPOFOL ;  Surgeon: Jinny Carmine, MD;  Location: [facility] ENDOSCOPY;  Service: Endoscopy;  Laterality: N/A;   DE QUERVAIN'S RELEASE Right 06/2022   ESOPHAGOGASTRODUODENOSCOPY (EGD) WITH PROPOFOL  N/A 02/27/2019   Procedure: ESOPHAGOGASTRODUODENOSCOPY (EGD) WITH PROPOFOL ;  Surgeon: Unk Corinn Skiff, MD;  Location: [facility] ENDOSCOPY;  Service: Gastroenterology;  Laterality: N/A;   ESOPHAGOGASTRODUODENOSCOPY (EGD) WITH PROPOFOL  N/A 09/14/2021   Procedure: ESOPHAGOGASTRODUODENOSCOPY (EGD)  WITH PROPOFOL ;  Surgeon: Jinny Carmine, MD;  Location: [facility] ENDOSCOPY;  Service: Endoscopy;  Laterality: N/A;   GIVENS CAPSULE STUDY N/A 09/15/2021   Procedure: GIVENS CAPSULE STUDY;  Surgeon: Jinny Carmine, MD;  Location: [facility] ENDOSCOPY;  Service: Endoscopy;  Laterality: N/A;   HEMORRHOID BANDING  10/20/2022   HEMORRHOID SURGERY N/A 12/28/2022   Procedure: HEMORRHOIDECTOMY;  Surgeon: Rodolph Romano, MD;  Location: [facility] ORS;  Service: General;  Laterality: N/A;   KNEE ARTHROSCOPY Left 01/04/2007   torn meniscus   PORTACATH PLACEMENT N/A 11/08/2023   Procedure: INSERTION, TUNNELED CENTRAL VENOUS DEVICE, WITH PORT;  Surgeon: Rodolph Romano, MD;  Location: [facility] ORS;   Service: General;  Laterality: N/A;   VITRECTOMY Right 09/2022    SOCIAL HISTORY: Social History   Socioeconomic History   Marital status: Single    Spouse name: Not on file   Number of children: 3   Years of education: Not on file   Highest education level: Bachelor's degree (e.g., BA, AB, BS)  Occupational History   Not on file  Tobacco Use   Smoking status: Never   Smokeless tobacco: Never  Vaping Use   Vaping status: Never Used  Substance and Sexual Activity   Alcohol use: Never   Drug use: Never   Sexual activity: Not Currently    Birth control/protection: Abstinence, Post-menopausal  Other Topics Concern   Not on file  Social History Narrative   Lives alone   Social Drivers of Health   Financial Resource Strain: Low Risk  (07/31/2023)   Overall Financial Resource Strain (CARDIA)    Difficulty of Paying Living Expenses: Not hard at all  Food Insecurity: No Food Insecurity (07/31/2023)   Hunger Vital Sign    Worried About Running Out of Food in the Last Year: Never true    Ran Out of Food in the Last Year: Never true  Transportation Needs: No Transportation Needs (07/31/2023)   PRAPARE - Administrator, Civil Service (Medical): No    Lack of Transportation (Non-Medical): No  Physical Activity: Sufficiently Active (07/31/2023)   Exercise Vital Sign    Days of Exercise per Week: 4 days    Minutes of Exercise per Session: 50 min  Stress: No Stress Concern Present (07/31/2023)   [employer] of Occupational Health - Occupational Stress Questionnaire    Feeling of Stress: Not at all  Social Connections: Moderately Integrated (07/31/2023)   Social Connection and Isolation Panel    Frequency of Communication with Friends and Family: More than three times a week    Frequency of Social Gatherings with Friends and Family: More than three times a week    Attends Religious Services: More than 4 times per year    Active Member of [employer] or Organizations: Yes     Attends Engineer, structural: More than 4 times per year    Marital Status: Divorced  Catering manager Violence: Not on file    FAMILY HISTORY: Family History  Problem Relation Age of Onset   Thyroid  disease Mother    Diabetes Mellitus II Mother    Diabetes Mother    Thyroid  disease Father    Healthy Sister    Hypothyroidism Daughter    Diabetes Son    Hypothyroidism Son    Diabetes Mellitus I Son    Kidney cancer Maternal Grandmother    Lung cancer Maternal Grandfather        smoker   Colon cancer Neg Hx    Breast cancer Neg  Hx     ALLERGIES:  is allergic to aspirin and penicillin g.  MEDICATIONS:  Current Outpatient Medications  Medication Sig Dispense Refill   acetaminophen  (TYLENOL ) 500 MG tablet Take 1,000 mg by mouth every 6 (six) hours as needed (pain.).     dexamethasone  (DECADRON ) 4 MG tablet Take 2 tabs by mouth 2 times daily starting day before chemo. Then take 2 tabs daily for 2 days starting day after chemo. Take with food. 30 tablet 1   ibuprofen  (ADVIL ) 200 MG tablet Take 400-600 mg by mouth every 8 (eight) hours as needed (pain.).     lidocaine -prilocaine  (EMLA ) cream Apply to affected area once 30 g 3   loperamide (IMODIUM) 2 MG capsule Take 1 capsule (2 mg total) by mouth See admin instructions. Initial: 4 mg,the 2 mg every 2 hours (4 mg every 4 hours at night)  maximum: 16 mg/day 60 capsule 2   Multiple Vitamin (MULTIVITAMIN WITH MINERALS) TABS tablet Take 1 tablet by mouth in the morning.     ondansetron  (ZOFRAN ) 8 MG tablet Take 1 tablet (8 mg total) by mouth every 8 (eight) hours as needed for nausea or vomiting. Start on the third day after chemotherapy. 30 tablet 1   prochlorperazine  (COMPAZINE ) 10 MG tablet Take 1 tablet (10 mg total) by mouth every 6 (six) hours as needed for nausea or vomiting. 30 tablet 1   Psyllium (METAMUCIL PO) Take 3 capsules by mouth in the morning and at bedtime. 2  to 3 capules     SYNTHROID  88 MCG tablet TAKE 1  TABLET BY MOUTH DAILY BEFORE BREAKFAST. (Patient taking differently: Take 88 mcg by mouth at bedtime.) 90 tablet 1   WORMWOOD,ARTEMISIA ABSINTHIUM, PO Take 1 capsule by mouth in the morning. Wormwood Black Walnut Clove Capsules     No current facility-administered medications for this visit.    Review of Systems  Constitutional:  Positive for fatigue. Negative for appetite change, chills and fever.  HENT:   Negative for hearing loss and voice change.   Eyes:  Negative for eye problems.  Respiratory:  Negative for chest tightness and cough.   Cardiovascular:  Negative for chest pain.  Gastrointestinal:  Negative for abdominal distention and abdominal pain.  Endocrine: Negative for hot flashes.  Genitourinary:  Negative for difficulty urinating and frequency.   Musculoskeletal:  Negative for arthralgias.  Skin:  Negative for itching and rash.  Neurological:  Negative for extremity weakness.  Hematological:  Negative for adenopathy.  Psychiatric/Behavioral:  Negative for confusion.     PHYSICAL EXAMINATION: ECOG PERFORMANCE STATUS: 1 - Symptomatic but completely ambulatory Vitals:   11/13/23 0830  BP: 135/87  Pulse: (!) 104  Resp: 18  Temp: 97.6 F (36.4 C)  SpO2: 97%   Filed Weights   11/13/23 0830  Weight: 156 lb 4.8 oz (70.9 kg)    Physical Exam Constitutional:      General: She is not in acute distress. HENT:     Head: Normocephalic and atraumatic.  Eyes:     General: No scleral icterus. Cardiovascular:     Rate and Rhythm: Normal rate.  Pulmonary:     Effort: Pulmonary effort is normal. No respiratory distress.  Abdominal:     General: There is no distension.  Musculoskeletal:        General: Normal range of motion.     Cervical back: Normal range of motion and neck supple.  Skin:    Findings: No erythema.     Comments: +  Medi port, mild erythematous changes at the port site. No drainage.    Neurological:     Mental Status: She is alert and oriented to  person, place, and time. Mental status is at baseline.  Psychiatric:        Mood and Affect: Mood normal.      LABORATORY DATA:  I have reviewed the data as listed    Latest Ref Rng & Units 11/13/2023    8:15 AM 10/13/2023   12:26 PM 04/10/2023    8:15 AM  CBC  WBC 4.0 - 10.5 K/uL 8.0  5.7  5.3   Hemoglobin 12.0 - 15.0 g/dL 85.7  86.2  85.8   Hematocrit 36.0 - 46.0 % 41.2  40.5  42.2   Platelets 150 - 400 K/uL 189  205  236       Latest Ref Rng & Units 11/13/2023    8:14 AM 10/13/2023   12:26 PM 08/01/2023   12:00 AM  CMP  Glucose 70 - 99 mg/dL 882  96  99   BUN 6 - 20 mg/dL 21  18  19    Creatinine 0.44 - 1.00 mg/dL 9.24  9.21  9.38   Sodium 135 - 145 mmol/L 136  132  136   Potassium 3.5 - 5.1 mmol/L 3.9  4.2  4.1   Chloride 98 - 111 mmol/L 103  100  99   CO2 22 - 32 mmol/L 22  25  21    Calcium 8.9 - 10.3 mg/dL 9.4  9.6  9.8   Total Protein 6.5 - 8.1 g/dL 7.0  8.2  7.7   Total Bilirubin 0.0 - 1.2 mg/dL 0.6  1.0  0.3   Alkaline Phos 38 - 126 U/L 53  52  69   AST 15 - 41 U/L 23  23  23    ALT 0 - 44 U/L 25  27  29        Component Value Date/Time   IRON  86 04/10/2023 0815   IRON  26 (L) 11/26/2021 1336   TIBC 364 04/10/2023 0815   TIBC 424 11/26/2021 1336   FERRITIN 85 04/10/2023 0815   FERRITIN 11 (L) 11/26/2021 1336   IRONPCTSAT 24 04/10/2023 0815   IRONPCTSAT 6 (LL) 11/26/2021 1336     RADIOGRAPHIC STUDIES: I have personally reviewed the radiological images as listed and agreed with the findings in the report. DG Chest Port 1 View Result Date: 11/08/2023 EXAM: 1 VIEW(S) XRAY OF THE CHEST 11/08/2023 01:57:00 PM COMPARISON: None available. CLINICAL HISTORY: Port-A-Cath in place [phone]. S/p port placement. FINDINGS: LINES, TUBES AND DEVICES: Tunneled left IJ Port-A-Cath in place with tip at lower SVC level. LUNGS AND PLEURA: No focal pulmonary opacity. No pulmonary edema. No pleural effusion. No pneumothorax. HEART AND MEDIASTINUM: No acute abnormality of the cardiac and  mediastinal silhouettes. BONES AND SOFT TISSUES: No acute osseous abnormality. IMPRESSION: 1. Tunneled left IJ Port-A-Cath with tip at the lower SVC. 2. No pneumothorax identified. Electronically signed by: Waddell Calk MD 11/08/2023 02:26 PM EDT RP Workstation: [id number]   DG C-Arm 1-60 Min-No Report Result Date: 11/08/2023 Fluoroscopy was utilized by the requesting physician.  No radiographic interpretation.   ECHOCARDIOGRAM COMPLETE Result Date: 10/25/2023    ECHOCARDIOGRAM REPORT   Patient Name:   KENYATTE GRUBER Date of Exam: 10/25/2023 Medical Rec #:  [id number]    Height:       67.5 in Accession #:    [id number]   Weight:       158.6  lb Date of Birth:  11-04-1965   BSA:          1.843 m Patient Age:    57 years     BP:           138/99 mmHg Patient Gender: F            HR:           73 bpm. Exam Location:  [facility] Procedure: 2D Echo, Color Doppler, Cardiac Doppler and Strain Analysis (Both            Spectral and Color Flow Doppler were utilized during procedure). Indications:     Chemo Z09  History:         Patient has no prior history of Echocardiogram examinations.  Sonographer:     Ashley McNeely-Sloane Referring Phys:  [id number] Clarissia Mckeen Diagnosing Phys: Marsa Dooms MD  Sonographer Comments: Global longitudinal strain was attempted. IMPRESSIONS  1. Left ventricular ejection fraction, by estimation, is 65 to 70%. The left ventricle has normal function. The left ventricle has no regional wall motion abnormalities. Left ventricular diastolic parameters are consistent with Grade I diastolic dysfunction (impaired relaxation). The global longitudinal strain is normal.  2. Right ventricular systolic function is normal. The right ventricular size is normal.  3. The mitral valve is normal in structure. No evidence of mitral valve regurgitation. No evidence of mitral stenosis.  4. The aortic valve is normal in structure. Aortic valve regurgitation is not visualized. No aortic stenosis is present.  5. The  inferior vena cava is normal in size with greater than 50% respiratory variability, suggesting right atrial pressure of 3 mmHg. FINDINGS  Left Ventricle: Left ventricular ejection fraction, by estimation, is 65 to 70%. The left ventricle has normal function. The left ventricle has no regional wall motion abnormalities. Strain was performed and the global longitudinal strain is normal. The  left ventricular internal cavity size was normal in size. There is no left ventricular hypertrophy. Left ventricular diastolic parameters are consistent with Grade I diastolic dysfunction (impaired relaxation). Right Ventricle: The right ventricular size is normal. No increase in right ventricular wall thickness. Right ventricular systolic function is normal. Left Atrium: Left atrial size was normal in size. Right Atrium: Right atrial size was normal in size. Pericardium: There is no evidence of pericardial effusion. Mitral Valve: The mitral valve is normal in structure. No evidence of mitral valve regurgitation. No evidence of mitral valve stenosis. MV peak gradient, 2.1 mmHg. The mean mitral valve gradient is 1.0 mmHg. Tricuspid Valve: The tricuspid valve is normal in structure. Tricuspid valve regurgitation is not demonstrated. No evidence of tricuspid stenosis. Aortic Valve: The aortic valve is normal in structure. Aortic valve regurgitation is not visualized. No aortic stenosis is present. Aortic valve mean gradient measures 3.0 mmHg. Aortic valve peak gradient measures 4.7 mmHg. Aortic valve area, by VTI measures 1.77 cm. Pulmonic Valve: The pulmonic valve was normal in structure. Pulmonic valve regurgitation is mild. No evidence of pulmonic stenosis. Aorta: The aortic root is normal in size and structure. Venous: The inferior vena cava is normal in size with greater than 50% respiratory variability, suggesting right atrial pressure of 3 mmHg. IAS/Shunts: No atrial level shunt detected by color flow Doppler. Additional  Comments: 3D was performed not requiring image post processing on an independent workstation and was indeterminate.  LEFT VENTRICLE PLAX 2D LVIDd:         4.00 cm     Diastology LVIDs:  1.60 cm     LV e' medial:    6.42 cm/s LV PW:         0.90 cm     LV E/e' medial:  8.5 LV IVS:        0.90 cm     LV e' lateral:   9.03 cm/s LVOT diam:     1.70 cm     LV E/e' lateral: 6.0 LV SV:         39 LV SV Index:   21 LVOT Area:     2.27 cm  LV Volumes (MOD) LV vol d, MOD A2C: 39.0 ml LV vol d, MOD A4C: 49.4 ml LV vol s, MOD A2C: 14.8 ml LV vol s, MOD A4C: 15.5 ml LV SV MOD A2C:     24.2 ml LV SV MOD A4C:     49.4 ml LV SV MOD BP:      29.3 ml RIGHT VENTRICLE RV Basal diam:  3.30 cm RV Mid diam:    2.10 cm RV S prime:     12.80 cm/s TAPSE (M-mode): 1.5 cm LEFT ATRIUM           Index        RIGHT ATRIUM           Index LA diam:      3.20 cm 1.74 cm/m   RA Area:     11.40 cm LA Vol (A4C): 18.7 ml 10.15 ml/m  RA Volume:   23.60 ml  12.81 ml/m  AORTIC VALVE                    PULMONIC VALVE AV Area (Vmax):    1.99 cm     PV Vmax:          0.85 m/s AV Area (Vmean):   1.96 cm     PV Vmean:         59.500 cm/s AV Area (VTI):     1.77 cm     PV VTI:           0.184 m AV Vmax:           108.00 cm/s  PV Peak grad:     2.9 mmHg AV Vmean:          73.000 cm/s  PV Mean grad:     2.0 mmHg AV VTI:            0.223 m      PR End Diast Vel: 5.57 msec AV Peak Grad:      4.7 mmHg     RVOT Peak grad:   2 mmHg AV Mean Grad:      3.0 mmHg LVOT Vmax:         94.90 cm/s LVOT Vmean:        62.900 cm/s LVOT VTI:          0.174 m LVOT/AV VTI ratio: 0.78  AORTA Ao Root diam: 2.20 cm Ao Asc diam:  3.00 cm MITRAL VALVE               TRICUSPID VALVE MV Area (PHT): 3.42 cm    TR Peak grad:   5.5 mmHg MV Area VTI:   2.31 cm    TR Mean grad:   4.0 mmHg MV Peak grad:  2.1 mmHg    TR Vmax:        117.00 cm/s MV Mean grad:  1.0 mmHg    TR Vmean:  103.0 cm/s MV Vmax:       0.72 m/s MV Vmean:      48.4 cm/s   SHUNTS MV Decel Time: 222 msec     Systemic VTI:  0.17 m MV E velocity: 54.50 cm/s  Systemic Diam: 1.70 cm MV A velocity: 64.50 cm/s  Pulmonic VTI:  0.151 m MV E/A ratio:  0.84 Marsa Dooms MD Electronically signed by Marsa Dooms MD Signature Date/Time: 10/25/2023/1:45:52 PM    Final    MR BREAST BILATERAL W WO CONTRAST INC CAD Result Date: 10/23/2023 CLINICAL DATA:  58 year old female with 2 sites of newly diagnosed RIGHT breast invasive ductal carcinoma. EXAM: BILATERAL BREAST MRI WITH AND WITHOUT CONTRAST TECHNIQUE: Multiplanar, multisequence MR images of both breasts were obtained prior to and following the intravenous administration of 7 ml of Gadavist  Three-dimensional MR images were rendered by post-processing of the original MR data on an independent workstation. The three-dimensional MR images were interpreted, and findings are reported in the following complete MRI report for this study. Three dimensional images were evaluated at the independent interpreting workstation using the DynaCAD thin client. COMPARISON:  Prior mammograms and ultrasounds FINDINGS: Breast composition: b. Scattered fibroglandular tissue. Background parenchymal enhancement: Mild Right breast: A 2.7 x 1.2 x 1.2 cm (AP x transverse x CC) mass and nonmasslike enhancement contains biopsy clip artifact (RIBBON clip) within the middle to posterior depth UPPER OUTER RIGHT breast is compatible with biopsy-proven malignancy (images 58-65: Series 9). Malignant non masslike enhancement extends 1.3 cm posterior to the RIBBON biopsy clip artifact. Biopsy clip artifact (VENUS clip) with minimal adjacent enhancement is noted within the middle depth UPPER-OUTER RIGHT breast (image 59: Series 9) at additional site of biopsy-proven malignancy. The extent of malignancy from the VENUS biopsy clip artifact to the posterior aspect of non masslike enhancement (associated with RIBBON biopsy clip site) measures an AP distance of 4.2 cm. No other suspicious mass or worrisome  enhancement identified. Left breast: No suspicious mass or worrisome enhancement. Lymph nodes: No abnormal appearing lymph nodes. Ancillary findings:  None. IMPRESSION: 1. Two sites of UPPER-OUTER RIGHT breast biopsy-proven malignancy spanning a total distance of 4.2 cm (see above). 2. No other MR evidence of malignancy within either breast. No abnormal appearing lymph nodes. RECOMMENDATION: Treatment plan BI-RADS CATEGORY  6: Known biopsy-proven malignancy. Electronically Signed   By: Reyes Phi M.D.   On: 10/23/2023 09:54

## 2023-11-13 NOTE — Assessment & Plan Note (Addendum)
 MRI breast results were reviewed and discussed with patient. Breast mass sites are.  And 2 sites spans a total distance of 4.2 cm.  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  (She is not interested in scalp cooling)  Labs are reviewed and discussed with patient. Proceed with cycle 1 TCHP with D3 GCSF. Recommend Claritin 10mg  daily x 4 days,

## 2023-11-13 NOTE — Assessment & Plan Note (Addendum)
 She is asymptomatic, possibly due to anxiety associated with cancer diagnosis 10/25/2023 LVEF 65-70%  Grade I diastolic dysfunction Monitor. Consider cardiology evaluation if progressive.

## 2023-11-13 NOTE — Assessment & Plan Note (Signed)
 Chemotherapy treatments as planned.

## 2023-11-14 ENCOUNTER — Inpatient Hospital Stay

## 2023-11-14 ENCOUNTER — Encounter: Payer: Self-pay | Admitting: *Deleted

## 2023-11-14 VITALS — BP 155/85 | HR 80 | Temp 96.8°F | Resp 18

## 2023-11-14 DIAGNOSIS — Z5111 Encounter for antineoplastic chemotherapy: Secondary | ICD-10-CM | POA: Diagnosis not present

## 2023-11-14 DIAGNOSIS — C50919 Malignant neoplasm of unspecified site of unspecified female breast: Secondary | ICD-10-CM

## 2023-11-14 MED ORDER — PALONOSETRON HCL INJECTION 0.25 MG/5ML
0.2500 mg | Freq: Once | INTRAVENOUS | Status: AC
  Administered 2023-11-14: 0.25 mg via INTRAVENOUS
  Filled 2023-11-14: qty 5

## 2023-11-14 MED ORDER — SODIUM CHLORIDE 0.9 % IV SOLN
672.0000 mg | Freq: Once | INTRAVENOUS | Status: AC
  Administered 2023-11-14: 670 mg via INTRAVENOUS
  Filled 2023-11-14: qty 67

## 2023-11-14 MED ORDER — SODIUM CHLORIDE 0.9 % IV SOLN
75.0000 mg/m2 | Freq: Once | INTRAVENOUS | Status: AC
  Administered 2023-11-14: 139 mg via INTRAVENOUS
  Filled 2023-11-14: qty 13.9

## 2023-11-14 MED ORDER — DEXAMETHASONE SODIUM PHOSPHATE 10 MG/ML IJ SOLN
10.0000 mg | Freq: Once | INTRAMUSCULAR | Status: AC
  Administered 2023-11-14: 10 mg via INTRAVENOUS
  Filled 2023-11-14: qty 1

## 2023-11-14 MED ORDER — ACETAMINOPHEN 325 MG PO TABS
650.0000 mg | ORAL_TABLET | Freq: Once | ORAL | Status: AC
  Administered 2023-11-14: 650 mg via ORAL
  Filled 2023-11-14: qty 2

## 2023-11-14 MED ORDER — SODIUM CHLORIDE 0.9 % IV SOLN
840.0000 mg | Freq: Once | INTRAVENOUS | Status: AC
  Administered 2023-11-14: 840 mg via INTRAVENOUS
  Filled 2023-11-14: qty 28

## 2023-11-14 MED ORDER — SODIUM CHLORIDE 0.9 % IV SOLN
INTRAVENOUS | Status: DC
  Filled 2023-11-14: qty 250

## 2023-11-14 MED ORDER — APREPITANT 130 MG/18ML IV EMUL
130.0000 mg | Freq: Once | INTRAVENOUS | Status: AC
  Administered 2023-11-14: 130 mg via INTRAVENOUS
  Filled 2023-11-14: qty 18

## 2023-11-14 MED ORDER — TRASTUZUMAB-ANNS CHEMO 150 MG IV SOLR
8.0000 mg/kg | Freq: Once | INTRAVENOUS | Status: AC
  Administered 2023-11-14: 600 mg via INTRAVENOUS
  Filled 2023-11-14: qty 28.57

## 2023-11-14 MED ORDER — DIPHENHYDRAMINE HCL 25 MG PO CAPS
50.0000 mg | ORAL_CAPSULE | Freq: Once | ORAL | Status: AC
  Administered 2023-11-14: 50 mg via ORAL
  Filled 2023-11-14: qty 2

## 2023-11-14 NOTE — Patient Instructions (Signed)
 [facility] - A DEPT OF [facility]  Discharge Instructions: Thank you for choosing [facility] to provide your oncology and hematology care.  If you have a lab appointment with the Cancer Center, please go directly to the Cancer Center and check in at the registration area.  Wear comfortable clothing and clothing appropriate for easy access to any Portacath or PICC line.   We strive to give you quality time with your provider. You may need to reschedule your appointment if you arrive late (15 or more minutes).  Arriving late affects you and other patients whose appointments are after yours.  Also, if you miss three or more appointments without notifying the office, you may be dismissed from the clinic at the provider's discretion.      For prescription refill requests, have your pharmacy contact our office and allow 72 hours for refills to be completed.    Today you received the following chemotherapy and/or immunotherapy agents KANJINTI, PERJETA, TAXOTERE, CARBOPLATIN      To help prevent nausea and vomiting after your treatment, we encourage you to take your nausea medication as directed.  BELOW ARE SYMPTOMS THAT SHOULD BE REPORTED IMMEDIATELY: *FEVER GREATER THAN 100.4 F (38 C) OR HIGHER *CHILLS OR SWEATING *NAUSEA AND VOMITING THAT IS NOT CONTROLLED WITH YOUR NAUSEA MEDICATION *UNUSUAL SHORTNESS OF BREATH *UNUSUAL BRUISING OR BLEEDING *URINARY PROBLEMS (pain or burning when urinating, or frequent urination) *BOWEL PROBLEMS (unusual diarrhea, constipation, pain near the anus) TENDERNESS IN MOUTH AND THROAT WITH OR WITHOUT PRESENCE OF ULCERS (sore throat, sores in mouth, or a toothache) UNUSUAL RASH, SWELLING OR PAIN  UNUSUAL VAGINAL DISCHARGE OR ITCHING   Items with * indicate a potential emergency and should be followed up as soon as possible or go to the Emergency Department if any problems should occur.  Please show the CHEMOTHERAPY  ALERT CARD or IMMUNOTHERAPY ALERT CARD at check-in to the Emergency Department and triage nurse.  Should you have questions after your visit or need to cancel or reschedule your appointment, please contact [facility] - A DEPT OF [facility]  [phone] and follow the prompts.  Office hours are 8:00 a.m. to 4:30 p.m. Monday - Friday. Please note that voicemails left after 4:00 p.m. may not be returned until the following business day.  We are closed weekends and major holidays. You have access to a nurse at all times for urgent questions. Please call the main number to the clinic [phone] and follow the prompts.  For any non-urgent questions, you may also contact your provider using MyChart. We now offer e-Visits for anyone 30 and older to request care online for non-urgent symptoms. For details visit [email or web address].   Also download the MyChart app! Go to the app store, search MyChart, open the app, select [facility], and log in with your MyChart username and password.   Trastuzumab Injection What is this medication? TRASTUZUMAB (tras TOO zoo mab) treats breast cancer and stomach cancer. It works by blocking a protein that causes cancer cells to grow and multiply. This helps to slow or stop the spread of cancer cells. This medicine may be used for other purposes; ask your health care provider or pharmacist if you have questions. COMMON BRAND NAME(S): Herceptin, HERCESSI, Herzuma, KANJINTI, Ogivri, Ontruzant, Trazimera What should I tell my care team before I take this medication? They need to know if you have any of these conditions:  Heart failure Lung disease An unusual or allergic reaction to trastuzumab, other medications, foods, dyes, or preservatives Pregnant or trying to get pregnant Breast-feeding How should I use this medication? This medication is injected into a vein. It is given by your care team in a hospital or clinic  setting. Talk to your care team about the use of this medication in children. It is not approved for use in children. Overdosage: If you think you have taken too much of this medicine contact a poison control center or emergency room at once. NOTE: This medicine is only for you. Do not share this medicine with others. What if I miss a dose? Keep appointments for follow-up doses. It is important not to miss your dose. Call your care team if you are unable to keep an appointment. What may interact with this medication? Certain types of chemotherapy, such as daunorubicin, doxorubicin, epirubicin, idarubicin This list may not describe all possible interactions. Give your health care provider a list of all the medicines, herbs, non-prescription drugs, or dietary supplements you use. Also tell them if you smoke, drink alcohol, or use illegal drugs. Some items may interact with your medicine. What should I watch for while using this medication? Your condition will be monitored carefully while you are receiving this medication. This medication may make you feel generally unwell. This is not uncommon, as chemotherapy affects healthy cells as well as cancer cells. Report any side effects. Continue your course of treatment even though you feel ill unless your care team tells you to stop. This medication may increase your risk of getting an infection. Call your care team for advice if you get a fever, chills, sore throat, or other symptoms of a cold or flu. Do not treat yourself. Try to avoid being around people who are sick. Avoid taking medications that contain aspirin, acetaminophen , ibuprofen , naproxen, or ketoprofen unless instructed by your care team. These medications can hide a fever. Talk to your care team if you may be pregnant. Serious birth defects can occur if you take this medication during pregnancy and for 7 months after the last dose. You will need a negative pregnancy test before starting this  medication. Contraception is recommended while taking this medication and for 7 months after the last dose. Your care team can help you find the option that works for you. Do not breastfeed while taking this medication and for 7 months after stopping treatment. What side effects may I notice from receiving this medication? Side effects that you should report to your care team as soon as possible: Allergic reactions or angioedema--skin rash, itching or hives, swelling of the face, eyes, lips, tongue, arms, or legs, trouble swallowing or breathing Dry cough, shortness of breath or trouble breathing Heart failure--shortness of breath, swelling of the ankles, feet, or hands, sudden weight gain, unusual weakness or fatigue Infection--fever, chills, cough, or sore throat Infusion reactions--chest pain, shortness of breath or trouble breathing, feeling faint or lightheaded Side effects that usually do not require medical attention (report to your care team if they continue or are bothersome): Diarrhea Dizziness Headache Nausea Trouble sleeping Vomiting This list may not describe all possible side effects. Call your doctor for medical advice about side effects. You may report side effects to FDA at 1-800-FDA-1088. Where should I keep my medication? This medication is given in a hospital or clinic. It will not be stored at home. NOTE: This sheet is a summary. It may not cover all possible information. If you  have questions about this medicine, talk to your doctor, pharmacist, or health care provider.  2024 Elsevier/Gold Standard (2021-06-15 00:00:00)  Pertuzumab Injection What is this medication? PERTUZUMAB (per TOOZ ue mab) treats breast cancer. It works by blocking a protein that causes cancer cells to grow and multiply. This helps to slow or stop the spread of cancer cells. It is a monoclonal antibody. This medicine may be used for other purposes; ask your health care provider or pharmacist if  you have questions. COMMON BRAND NAME(S): PERJETA What should I tell my care team before I take this medication? They need to know if you have any of these conditions: Heart failure An unusual or allergic reaction to pertuzumab, other medications, foods, dyes, or preservatives Pregnant or trying to get pregnant Breast-feeding How should I use this medication? This medication is injected into a vein. It is given by your care team in a hospital or clinic setting. Talk to your care team about the use of this medication in children. Special care may be needed. Overdosage: If you think you have taken too much of this medicine contact a poison control center or emergency room at once. NOTE: This medicine is only for you. Do not share this medicine with others. What if I miss a dose? Keep appointments for follow-up doses. It is important not to miss your dose. Call your care team if you are unable to keep an appointment. What may interact with this medication? Interactions are not expected. This list may not describe all possible interactions. Give your health care provider a list of all the medicines, herbs, non-prescription drugs, or dietary supplements you use. Also tell them if you smoke, drink alcohol, or use illegal drugs. Some items may interact with your medicine. What should I watch for while using this medication? Your condition will be monitored carefully while you are receiving this medication. This medication may make you feel generally unwell. This is not uncommon as chemotherapy can affect healthy cells as well as cancer cells. Report any side effects. Continue your course of treatment even though you feel ill unless your care team tells you to stop. Talk to your care team if you may be pregnant. Serious birth defects can occur if you take this medication during pregnancy and for 7 months after the last dose. You will need a negative pregnancy test before starting this medication.  Contraception is recommended while taking this medication and for 7 months after the last dose. Your care team can help you find the option that works for you. Do not breastfeed while taking this medication and for 7 months after the last dose. What side effects may I notice from receiving this medication? Side effects that you should report to your care team as soon as possible: Allergic reactions or angioedema--skin rash, itching or hives, swelling of the face, eyes, lips, tongue, arms, or legs, trouble swallowing or breathing Heart failure--shortness of breath, swelling of the ankles, feet, or hands, sudden weight gain, unusual weakness or fatigue Infusion reactions--chest pain, shortness of breath or trouble breathing, feeling faint or lightheaded Side effects that usually do not require medical attention (report to your care team if they continue or are bothersome): Diarrhea Dry skin Fatigue Hair loss Nausea Vomiting This list may not describe all possible side effects. Call your doctor for medical advice about side effects. You may report side effects to FDA at 1-800-FDA-1088. Where should I keep my medication? This medication is given in a hospital or clinic. It  will not be stored at home. NOTE: This sheet is a summary. It may not cover all possible information. If you have questions about this medicine, talk to your doctor, pharmacist, or health care provider.  2024 Elsevier/Gold Standard (2021-06-15 00:00:00)  Docetaxel Injection What is this medication? DOCETAXEL (doe se TAX el) treats some types of cancer. It works by slowing down the growth of cancer cells. This medicine may be used for other purposes; ask your health care provider or pharmacist if you have questions. COMMON BRAND NAME(S): BEIZRAY, Docefrez, Docivyx, Taxotere What should I tell my care team before I take this medication? They need to know if you have any of these conditions: Kidney disease Liver disease Low  white blood cell levels Tingling of the fingers or toes or other nerve disorder An unusual or allergic reaction to docetaxel, polysorbate 80, other medications, foods, dyes, or preservatives Pregnant or trying to get pregnant Breast-feeding How should I use this medication? This medication is injected into a vein. It is given by your care team in a hospital or clinic setting. Talk to your care team about the use of this medication in children. Special care may be needed. Overdosage: If you think you have taken too much of this medicine contact a poison control center or emergency room at once. NOTE: This medicine is only for you. Do not share this medicine with others. What if I miss a dose? Keep appointments for follow-up doses. It is important not to miss your dose. Call your care team if you are unable to keep an appointment. What may interact with this medication? Do not take this medication with any of the following: Live virus vaccines This medication may also interact with the following: Certain antibiotics, such as clarithromycin, telithromycin Certain antivirals for HIV or hepatitis Certain medications for fungal infections, such as itraconazole, ketoconazole, voriconazole Grapefruit juice Nefazodone Supplements, such as St. John's wort This list may not describe all possible interactions. Give your health care provider a list of all the medicines, herbs, non-prescription drugs, or dietary supplements you use. Also tell them if you smoke, drink alcohol, or use illegal drugs. Some items may interact with your medicine. What should I watch for while using this medication? This medication may make you feel generally unwell. This is not uncommon as chemotherapy can affect healthy cells as well as cancer cells. Report any side effects. Continue your course of treatment even though you feel ill unless your care team tells you to stop. You may need blood work done while you are taking this  medication. This medication can cause serious side effects and infusion reactions. To reduce the risk, your care team may give you other medications to take before receiving this one. Be sure to follow the directions from your care team. This medication may increase your risk of getting an infection. Call your care team for advice if you get a fever, chills, sore throat, or other symptoms of a cold or flu. Do not treat yourself. Try to avoid being around people who are sick. Avoid taking medications that contain aspirin, acetaminophen , ibuprofen , naproxen, or ketoprofen unless instructed by your care team. These medications may hide a fever. Be careful brushing or flossing your teeth or using a toothpick because you may get an infection or bleed more easily. If you have any dental work done, tell your dentist you are receiving this medication. Some products may contain alcohol. Ask your care team if this medication contains alcohol. Be sure to  tell all care teams you are taking this medicine. Certain medications, like metronidazole  and disulfiram, can cause an unpleasant reaction when taken with alcohol. The reaction includes flushing, headache, nausea, vomiting, sweating, and increased thirst. The reaction can last from 30 minutes to several hours. This medication may affect your coordination, reaction time, or judgement. Do not drive or operate machinery until you know how this medication affects you. Sit up or stand slowly to reduce the risk of dizzy or fainting spells. Drinking alcohol with this medication can increase the risk of these side effects. Talk to your care team about your risk of cancer. You may be more at risk for certain types of cancer if you take this medication. Talk to your care team if you wish to become pregnant or think you might be pregnant. This medication can cause serious birth defects if taken during pregnancy or if you get pregnant within 2 months after stopping therapy. A  negative pregnancy test is required before starting this medication. A reliable form of contraception is recommended while taking this medication and for 2 months after stopping it. Talk to your care team about reliable forms of contraception. Do not breast-feed while taking this medication and for 1 week after stopping therapy. Use a condom during sex and for 4 months after stopping therapy. Tell your care team right away if you think your partner might be pregnant. This medication can cause serious birth defects. This medication may cause infertility. Talk to your care team if you are concerned about your fertility. What side effects may I notice from receiving this medication? Side effects that you should report to your care team as soon as possible: Allergic reactions--skin rash, itching, hives, swelling of the face, lips, tongue, or throat Change in vision such as blurry vision, seeing halos around lights, vision loss Infection--fever, chills, cough, or sore throat Infusion reactions--chest pain, shortness of breath or trouble breathing, feeling faint or lightheaded Low red blood cell level--unusual weakness or fatigue, dizziness, headache, trouble breathing Pain, tingling, or numbness in the hands or feet Painful swelling, warmth, or redness of the skin, blisters or sores at the infusion site Redness, blistering, peeling, or loosening of the skin, including inside the mouth Sudden or severe stomach pain, bloody diarrhea, fever, nausea, vomiting Swelling of the ankles, hands, or feet Tumor lysis syndrome (TLS)--nausea, vomiting, diarrhea, decrease in the amount of urine, dark urine, unusual weakness or fatigue, confusion, muscle pain or cramps, fast or irregular heartbeat, joint pain Unusual bruising or bleeding Side effects that usually do not require medical attention (report to your care team if they continue or are bothersome): Change in nail shape, thickness, or color Change in  taste Hair loss Increased tears This list may not describe all possible side effects. Call your doctor for medical advice about side effects. You may report side effects to FDA at 1-800-FDA-1088. Where should I keep my medication? This medication is given in a hospital or clinic. It will not be stored at home. NOTE: This sheet is a summary. It may not cover all possible information. If you have questions about this medicine, talk to your doctor, pharmacist, or health care provider.  2024 Elsevier/Gold Standard (2021-04-08 00:00:00)  Carboplatin Injection What is this medication? CARBOPLATIN (KAR boe pla tin) treats some types of cancer. It works by slowing down the growth of cancer cells. This medicine may be used for other purposes; ask your health care provider or pharmacist if you have questions. COMMON BRAND NAME(S): Paraplatin  What should I tell my care team before I take this medication? They need to know if you have any of these conditions: Blood disorders Hearing problems Kidney disease Recent or ongoing radiation therapy An unusual or allergic reaction to carboplatin, cisplatin, other medications, foods, dyes, or preservatives Pregnant or trying to get pregnant Breast-feeding How should I use this medication? This medication is injected into a vein. It is given by your care team in a hospital or clinic setting. Talk to your care team about the use of this medication in children. Special care may be needed. Overdosage: If you think you have taken too much of this medicine contact a poison control center or emergency room at once. NOTE: This medicine is only for you. Do not share this medicine with others. What if I miss a dose? Keep appointments for follow-up doses. It is important not to miss your dose. Call your care team if you are unable to keep an appointment. What may interact with this medication? Medications for seizures Some antibiotics, such as amikacin, gentamicin,  neomycin, streptomycin, tobramycin Vaccines This list may not describe all possible interactions. Give your health care provider a list of all the medicines, herbs, non-prescription drugs, or dietary supplements you use. Also tell them if you smoke, drink alcohol, or use illegal drugs. Some items may interact with your medicine. What should I watch for while using this medication? Your condition will be monitored carefully while you are receiving this medication. You may need blood work while taking this medication. This medication may make you feel generally unwell. This is not uncommon, as chemotherapy can affect healthy cells as well as cancer cells. Report any side effects. Continue your course of treatment even though you feel ill unless your care team tells you to stop. In some cases, you may be given additional medications to help with side effects. Follow all directions for their use. This medication may increase your risk of getting an infection. Call your care team for advice if you get a fever, chills, sore throat, or other symptoms of a cold or flu. Do not treat yourself. Try to avoid being around people who are sick. Avoid taking medications that contain aspirin, acetaminophen , ibuprofen , naproxen, or ketoprofen unless instructed by your care team. These medications may hide a fever. Be careful brushing or flossing your teeth or using a toothpick because you may get an infection or bleed more easily. If you have any dental work done, tell your dentist you are receiving this medication. Talk to your care team if you wish to become pregnant or think you might be pregnant. This medication can cause serious birth defects. Talk to your care team about effective forms of contraception. Do not breast-feed while taking this medication. What side effects may I notice from receiving this medication? Side effects that you should report to your care team as soon as possible: Allergic reactions--skin  rash, itching, hives, swelling of the face, lips, tongue, or throat Infection--fever, chills, cough, sore throat, wounds that don't heal, pain or trouble when passing urine, general feeling of discomfort or being unwell Low red blood cell level--unusual weakness or fatigue, dizziness, headache, trouble breathing Pain, tingling, or numbness in the hands or feet, muscle weakness, change in vision, confusion or trouble speaking, loss of balance or coordination, trouble walking, seizures Unusual bruising or bleeding Side effects that usually do not require medical attention (report to your care team if they continue or are bothersome): Hair loss Nausea Unusual weakness or  fatigue Vomiting This list may not describe all possible side effects. Call your doctor for medical advice about side effects. You may report side effects to FDA at 1-800-FDA-1088. Where should I keep my medication? This medication is given in a hospital or clinic. It will not be stored at home. NOTE: This sheet is a summary. It may not cover all possible information. If you have questions about this medicine, talk to your doctor, pharmacist, or health care provider.  2024 Elsevier/Gold Standard (2021-05-25 00:00:00)

## 2023-11-15 ENCOUNTER — Telehealth: Payer: Self-pay

## 2023-11-15 ENCOUNTER — Encounter: Payer: Self-pay | Admitting: Oncology

## 2023-11-15 ENCOUNTER — Ambulatory Visit

## 2023-11-15 NOTE — Telephone Encounter (Signed)
Telephone call to patient for follow up after receiving first infusion.   No answer but left message stating we were calling to check on them.  Encouraged patient to call for any questions or concerns.   

## 2023-11-15 NOTE — Anesthesia Postprocedure Evaluation (Signed)
 Anesthesia Post Note  Patient: Annette Phillips  Procedure(s) Performed: INSERTION, TUNNELED CENTRAL VENOUS DEVICE, WITH PORT (Chest)  Patient location during evaluation: PACU Anesthesia Type: General Level of consciousness: awake and alert Pain management: pain level controlled Vital Signs Assessment: post-procedure vital signs reviewed and stable Respiratory status: spontaneous breathing, nonlabored ventilation, respiratory function stable and patient connected to nasal cannula oxygen Cardiovascular status: blood pressure returned to baseline and stable Postop Assessment: no apparent nausea or vomiting Anesthetic complications: no   No notable events documented.   Last Vitals:  Vitals:   11/08/23 1430 11/08/23 1450  BP: 132/79 (!) 140/88  Pulse: 62   Resp: 12 15  Temp:  36.5 C  SpO2: 97% 98%    Last Pain:  Vitals:   11/08/23 1450  TempSrc: Temporal  PainSc: 0-No pain                 Prentice Murphy

## 2023-11-16 ENCOUNTER — Inpatient Hospital Stay: Attending: Oncology

## 2023-11-16 DIAGNOSIS — Z171 Estrogen receptor negative status [ER-]: Secondary | ICD-10-CM | POA: Insufficient documentation

## 2023-11-16 DIAGNOSIS — Z8051 Family history of malignant neoplasm of kidney: Secondary | ICD-10-CM | POA: Insufficient documentation

## 2023-11-16 DIAGNOSIS — R042 Hemoptysis: Secondary | ICD-10-CM | POA: Insufficient documentation

## 2023-11-16 DIAGNOSIS — C50211 Malignant neoplasm of upper-inner quadrant of right female breast: Secondary | ICD-10-CM | POA: Insufficient documentation

## 2023-11-16 DIAGNOSIS — D701 Agranulocytosis secondary to cancer chemotherapy: Secondary | ICD-10-CM | POA: Diagnosis not present

## 2023-11-16 DIAGNOSIS — D696 Thrombocytopenia, unspecified: Secondary | ICD-10-CM | POA: Diagnosis not present

## 2023-11-16 DIAGNOSIS — K1230 Oral mucositis (ulcerative), unspecified: Secondary | ICD-10-CM | POA: Diagnosis not present

## 2023-11-16 DIAGNOSIS — I5189 Other ill-defined heart diseases: Secondary | ICD-10-CM | POA: Insufficient documentation

## 2023-11-16 DIAGNOSIS — Z1722 Progesterone receptor negative status: Secondary | ICD-10-CM | POA: Insufficient documentation

## 2023-11-16 DIAGNOSIS — K521 Toxic gastroenteritis and colitis: Secondary | ICD-10-CM | POA: Insufficient documentation

## 2023-11-16 DIAGNOSIS — Z5111 Encounter for antineoplastic chemotherapy: Secondary | ICD-10-CM | POA: Insufficient documentation

## 2023-11-16 DIAGNOSIS — E039 Hypothyroidism, unspecified: Secondary | ICD-10-CM | POA: Diagnosis not present

## 2023-11-16 DIAGNOSIS — R7989 Other specified abnormal findings of blood chemistry: Secondary | ICD-10-CM | POA: Diagnosis not present

## 2023-11-16 DIAGNOSIS — R000 Tachycardia, unspecified: Secondary | ICD-10-CM | POA: Insufficient documentation

## 2023-11-16 DIAGNOSIS — E871 Hypo-osmolality and hyponatremia: Secondary | ICD-10-CM | POA: Diagnosis not present

## 2023-11-16 DIAGNOSIS — R21 Rash and other nonspecific skin eruption: Secondary | ICD-10-CM | POA: Diagnosis not present

## 2023-11-16 DIAGNOSIS — R7303 Prediabetes: Secondary | ICD-10-CM | POA: Diagnosis not present

## 2023-11-16 DIAGNOSIS — B370 Candidal stomatitis: Secondary | ICD-10-CM | POA: Diagnosis not present

## 2023-11-16 DIAGNOSIS — Z5112 Encounter for antineoplastic immunotherapy: Secondary | ICD-10-CM | POA: Insufficient documentation

## 2023-11-16 DIAGNOSIS — T451X5A Adverse effect of antineoplastic and immunosuppressive drugs, initial encounter: Secondary | ICD-10-CM | POA: Diagnosis not present

## 2023-11-16 DIAGNOSIS — C50919 Malignant neoplasm of unspecified site of unspecified female breast: Secondary | ICD-10-CM

## 2023-11-16 DIAGNOSIS — Z5189 Encounter for other specified aftercare: Secondary | ICD-10-CM | POA: Diagnosis not present

## 2023-11-16 DIAGNOSIS — Z1731 Human epidermal growth factor receptor 2 positive status: Secondary | ICD-10-CM | POA: Diagnosis not present

## 2023-11-16 DIAGNOSIS — Z801 Family history of malignant neoplasm of trachea, bronchus and lung: Secondary | ICD-10-CM | POA: Diagnosis not present

## 2023-11-16 MED ORDER — PEGFILGRASTIM-JMDB 6 MG/0.6ML ~~LOC~~ SOSY
6.0000 mg | PREFILLED_SYRINGE | Freq: Once | SUBCUTANEOUS | Status: AC
  Administered 2023-11-16: 6 mg via SUBCUTANEOUS
  Filled 2023-11-16: qty 0.6

## 2023-11-17 ENCOUNTER — Other Ambulatory Visit: Payer: Self-pay

## 2023-11-17 DIAGNOSIS — C50919 Malignant neoplasm of unspecified site of unspecified female breast: Secondary | ICD-10-CM

## 2023-11-18 ENCOUNTER — Telehealth: Payer: Self-pay | Admitting: Internal Medicine

## 2023-11-18 ENCOUNTER — Encounter: Payer: Self-pay | Admitting: Internal Medicine

## 2023-11-18 NOTE — Progress Notes (Signed)
 Patient called regarding rectal bleeding.  Attempted to call twice unable to reach the patient left a voicemail to call us  back on the second call.

## 2023-11-18 NOTE — Telephone Encounter (Signed)
 Pt called re: rectal bleeding- [Hx of hemorrhoids]-just a few trickles of blood.  No profuse bleeding.  Patient not on any antiplatelet therapy or anticoagulation.  Recommend monitoring over the weekend-if worse recommend go to the emergency room.   Patient recommended to keep the appointment for 10/06 as planned-  Primary team  notified-  GB

## 2023-11-20 ENCOUNTER — Other Ambulatory Visit: Payer: Self-pay

## 2023-11-20 ENCOUNTER — Inpatient Hospital Stay: Admitting: Oncology

## 2023-11-20 ENCOUNTER — Encounter: Payer: Self-pay | Admitting: Oncology

## 2023-11-20 ENCOUNTER — Telehealth: Payer: Self-pay

## 2023-11-20 ENCOUNTER — Inpatient Hospital Stay

## 2023-11-20 VITALS — BP 122/86 | HR 130 | Temp 98.4°F | Resp 18 | Ht 67.0 in | Wt 155.0 lb

## 2023-11-20 DIAGNOSIS — R000 Tachycardia, unspecified: Secondary | ICD-10-CM

## 2023-11-20 DIAGNOSIS — K1230 Oral mucositis (ulcerative), unspecified: Secondary | ICD-10-CM | POA: Insufficient documentation

## 2023-11-20 DIAGNOSIS — Z1731 Human epidermal growth factor receptor 2 positive status: Secondary | ICD-10-CM | POA: Diagnosis not present

## 2023-11-20 DIAGNOSIS — C50919 Malignant neoplasm of unspecified site of unspecified female breast: Secondary | ICD-10-CM

## 2023-11-20 DIAGNOSIS — Z171 Estrogen receptor negative status [ER-]: Secondary | ICD-10-CM | POA: Diagnosis not present

## 2023-11-20 DIAGNOSIS — C50211 Malignant neoplasm of upper-inner quadrant of right female breast: Secondary | ICD-10-CM | POA: Diagnosis not present

## 2023-11-20 DIAGNOSIS — K521 Toxic gastroenteritis and colitis: Secondary | ICD-10-CM

## 2023-11-20 DIAGNOSIS — Z5111 Encounter for antineoplastic chemotherapy: Secondary | ICD-10-CM | POA: Diagnosis not present

## 2023-11-20 DIAGNOSIS — D701 Agranulocytosis secondary to cancer chemotherapy: Secondary | ICD-10-CM | POA: Insufficient documentation

## 2023-11-20 DIAGNOSIS — Z1722 Progesterone receptor negative status: Secondary | ICD-10-CM | POA: Diagnosis not present

## 2023-11-20 DIAGNOSIS — T451X5A Adverse effect of antineoplastic and immunosuppressive drugs, initial encounter: Secondary | ICD-10-CM | POA: Insufficient documentation

## 2023-11-20 DIAGNOSIS — B370 Candidal stomatitis: Secondary | ICD-10-CM | POA: Insufficient documentation

## 2023-11-20 LAB — CBC WITH DIFFERENTIAL (CANCER CENTER ONLY)
Abs Immature Granulocytes: 0.1 K/uL — ABNORMAL HIGH (ref 0.00–0.07)
Basophils Absolute: 0 K/uL (ref 0.0–0.1)
Basophils Relative: 0 %
Eosinophils Absolute: 0 K/uL (ref 0.0–0.5)
Eosinophils Relative: 1 %
HCT: 38 % (ref 36.0–46.0)
Hemoglobin: 13 g/dL (ref 12.0–15.0)
Lymphocytes Relative: 56 %
Lymphs Abs: 1.3 K/uL (ref 0.7–4.0)
MCH: 30.3 pg (ref 26.0–34.0)
MCHC: 34.2 g/dL (ref 30.0–36.0)
MCV: 88.6 fL (ref 80.0–100.0)
Metamyelocytes Relative: 3 %
Monocytes Absolute: 0.4 K/uL (ref 0.1–1.0)
Monocytes Relative: 18 %
Neutro Abs: 0.5 K/uL — ABNORMAL LOW (ref 1.7–7.7)
Neutrophils Relative %: 22 %
Platelet Count: 86 K/uL — ABNORMAL LOW (ref 150–400)
RBC: 4.29 MIL/uL (ref 3.87–5.11)
RDW: 12.9 % (ref 11.5–15.5)
WBC Count: 2.3 K/uL — ABNORMAL LOW (ref 4.0–10.5)
nRBC: 0 % (ref 0.0–0.2)

## 2023-11-20 LAB — CMP (CANCER CENTER ONLY)
ALT: 34 U/L (ref 0–44)
AST: 25 U/L (ref 15–41)
Albumin: 3.6 g/dL (ref 3.5–5.0)
Alkaline Phosphatase: 47 U/L (ref 38–126)
Anion gap: 6 (ref 5–15)
BUN: 17 mg/dL (ref 6–20)
CO2: 24 mmol/L (ref 22–32)
Calcium: 8.6 mg/dL — ABNORMAL LOW (ref 8.9–10.3)
Chloride: 101 mmol/L (ref 98–111)
Creatinine: 0.58 mg/dL (ref 0.44–1.00)
GFR, Estimated: >60 mL/min (ref >60)
Glucose, Bld: 92 mg/dL (ref 70–99)
Potassium: 3.9 mmol/L (ref 3.5–5.1)
Sodium: 131 mmol/L — ABNORMAL LOW (ref 135–145)
Total Bilirubin: 0.6 mg/dL (ref 0.0–1.2)
Total Protein: 7.1 g/dL (ref 6.5–8.1)

## 2023-11-20 MED ORDER — STERILE WATER FOR INJECTION IJ SOLN
5.0000 mL | Freq: Four times a day (QID) | OROMUCOSAL | 1 refills | Status: DC | PRN
  Filled 2023-11-20: qty 480, 12d supply, fill #0

## 2023-11-20 MED ORDER — NYSTATIN 100000 UNIT/ML MT SUSP
5.0000 mL | Freq: Four times a day (QID) | OROMUCOSAL | 0 refills | Status: DC
  Filled 2023-11-20: qty 473, 24d supply, fill #0

## 2023-11-20 MED ORDER — OYSTER SHELL CALCIUM/D3 500-5 MG-MCG PO TABS: 2.0000 | ORAL_TABLET | Freq: Two times a day (BID) | ORAL | Status: DC

## 2023-11-20 MED ORDER — SODIUM CHLORIDE 0.9 % IV SOLN
Freq: Once | INTRAVENOUS | Status: AC
  Filled 2023-11-20: qty 250

## 2023-11-20 MED ORDER — MAGIC MOUTHWASH W/LIDOCAINE: 5.0000 mL | Freq: Four times a day (QID) | ORAL | 1 refills | Status: DC | PRN

## 2023-11-20 NOTE — Assessment & Plan Note (Signed)
 She is asymptomatic, possibly due to anxiety associated with cancer diagnosis 10/25/2023 LVEF 65-70%  Grade I diastolic dysfunction HR is 130 today, probably due to dehydration.  Refer to cardiology evaluation if progressive.

## 2023-11-20 NOTE — Assessment & Plan Note (Addendum)
 MRI breast results were reviewed and discussed with patient. Breast mass sites are.  And 2 sites spans a total distance of 4.2 cm.  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  (She is not interested in scalp cooling)  Labs are reviewed and discussed with patient. S/p cycle 1 TCHP with D3 GCSF. Recommend Claritin 10mg  daily x 4 days Continue supportive care

## 2023-11-20 NOTE — Assessment & Plan Note (Signed)
 Recommend Magic mouthwash prescription was sent to pharmacy.

## 2023-11-20 NOTE — Progress Notes (Signed)
 Hematology/Oncology Progress note [phone] [phone]       CHIEF COMPLAINTS/REASON FOR VISIT:  Stage IIA HER2 positive breast cancer.  ASSESSMENT & PLAN:   Cancer Staging  Invasive carcinoma of breast (HCC) Staging form: Breast, AJCC 8th Edition - Clinical stage from 10/13/2023: Stage IIA (cT2, cN0, cM0, G3, ER-, PR-, HER2+) - Signed by Annette Call, MD on 10/24/2023   Invasive carcinoma of breast [facility]) MRI breast results were reviewed and discussed with patient. Breast mass sites are.  And 2 sites spans a total distance of 4.2 cm.  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  (She is not interested in scalp cooling)  Labs are reviewed and discussed with patient. S/p cycle 1 TCHP with D3 GCSF. Recommend Claritin 10mg  daily x 4 days Continue supportive care  Sinus tachycardia She is asymptomatic, possibly due to anxiety associated with cancer diagnosis 10/25/2023 LVEF 65-70%  Grade I diastolic dysfunction HR is 130 today, probably due to dehydration.  Refer to cardiology evaluation if progressive.   Chemotherapy induced diarrhea Recommend patient to use Imodium PRN as directed If symptoms persist, plan to switch to Lomotil.  Small amount of blood in stool, possibly secondary to focal irritation/inflammation secondary to radiation. She has a history of hemorrhoid resection.  Recommend patient to see surgery for evaluation.  Patient will receive IV fluid hydration session today.   Mucositis Recommend Magic mouthwash prescription was sent to pharmacy.  Thrush Recommend nystatin  oral rinse, prescription sent to pharmacy.  Chemotherapy induced neutropenia Recommend neutropenic precaution.  She has received long-acting G-CSF  Patient declined influenza vaccination   Orders Placed This Encounter  Procedures   CBC with Differential (Cancer Center Only)     Standing Status:   Future    Expected Date:   01/16/2024    Expiration Date:   01/15/2025   CMP (Cancer Center only)    Standing Status:   Future    Expected Date:   01/16/2024    Expiration Date:   01/15/2025   CBC with Differential (Cancer Center Only)    Standing Status:   Future    Expected Date:   02/12/2024    Expiration Date:   02/11/2025   CMP (Cancer Center only)    Standing Status:   Future    Expected Date:   02/12/2024    Expiration Date:   02/11/2025   CBC with Differential (Cancer Center Only)    Standing Status:   Future    Expected Date:   03/04/2024    Expiration Date:   03/04/2025   CMP (Cancer Center only)    Standing Status:   Future    Expected Date:   03/04/2024    Expiration Date:   03/04/2025   CBC with Differential (Cancer Center Only)    Standing Status:   Future    Expected Date:   11/27/2023    Expiration Date:   02/25/2024   CMP (Cancer Center only)    Standing Status:   Future    Expected Date:   11/27/2023    Expiration Date:   02/25/2024   CMP (Cancer Center only)    Standing Status:   Future    Expected Date:   11/27/2023    Expiration Date:   02/25/2024   CBC with Differential (Cancer Center Only)    Standing Status:   Future    Expected Date:   11/27/2023  Expiration Date:   02/25/2024   Ambulatory referral to Cardiology    Referral Priority:   Routine    Referral Type:   Consultation    Referral Reason:   Specialty Services Required    Number of Visits Requested:   1    Follow up 1 week lab NP IVF 2 weeks lab MD chemo Patient all questions were answered. The patient knows to Phillips the clinic with any problems, questions or concerns.  Annette Cap, MD, PhD [facility] Hematology Oncology 11/20/2023     HISTORY OF PRESENTING ILLNESS:  Annette Phillips is a  58 y.o.  female with PMH listed below who presents for follow up   History of iron  deficiency anemia.  09/14/21 EGD: Small hiatal hernia, nonobstructing Schatzki ring, stomach and duodenum  normal.  Colonoscopy: Bleeding hemorrhoids grade 3.  10/08/21 capsule study negative.  S/p hemorrhoid banding in Sept 2024, recently started to have rectal bleeding.   + fatigue. + right side abdomen discomfort, intermittently. Recent US  showed no acute finding. Fatty liver disease. History of cholecystectomy.  She denies recent chest pain on exertion, pre-syncopal episodes, or palpitations She works as Careers adviser  Oncology History  Invasive carcinoma of breast (HCC)  09/18/2023 Mammogram   Bilateral screening mammogram  In the right breast, possible asymmetries warrant further evaluation. In the left breast, no findings suspicious for malignancy.   09/22/2023 Mammogram   Right breast diagnostic mammogram   1. There is a highly suspicious 10 mm mass in the RIGHT upper inner breast. Recommend ultrasound-guided biopsy for definitive characterization. 2. There is a sonographically identified additional 6 mm mass in the RIGHT upper inner breast which is indeterminate. Recommend ultrasound-guided biopsy for definitive characterization. 3. No suspicious RIGHT axillary adenopathy.   09/28/2023 Initial Diagnosis   Invasive carcinoma of breast (HCC)  S/p right breast biopsy.   1. Breast, right, needle core biopsy, 2:00 6cmfn 10mm (ribbon clip) - INVASIVE DUCTAL CARCINOMA, SEE NOTE - TUBULE FORMATION: SCORE 3 - NUCLEAR PLEOMORPHISM: SCORE 3 - MITOTIC COUNT: SCORE 3 - TOTAL SCORE: 9 - OVERALL GRADE: 3 - LYMPHOVASCULAR INVASION: NOT IDENTIFIED - CANCER LENGTH: 0.7 CM - CALCIFICATIONS: NOT IDENTIFIED - OTHER FINDINGS: NONE 2. Breast, right, needle core biopsy, 1:00 3cmfn 6mm (venus clip) - INVASIVE DUCTAL CARCINOMA, SEE NOTE - TUBULE FORMATION: SCORE 3 - NUCLEAR PLEOMORPHISM: SCORE 3 - MITOTIC COUNT: SCORE 3 - TOTAL SCORE: 9 - OVERALL GRADE: 3 - LYMPHOVASCULAR INVASION: NOT IDENTIFIED - CANCER LENGTH: 0.5 CM - CALCIFICATIONS: NOT IDENTIFIED - OTHER  FINDINGS: NONE  1. 1) Breast, right, needle core biopsy, 2:00 6 cmfn 10mm ( ribbon clip) PROGNOSTIC INDICATORS Results: IMMUNOHISTOCHEMICAL AND MORPHOMETRIC ANALYSIS PERFORMED MANUALLY The tumor cells are POSITIVE for Her2 (3+). Estrogen Receptor: 0%, NEGATIVE Progesterone Receptor: 0%, NEGATIVE Proliferation Marker Ki67: 50%     10/13/2023 Cancer Staging   Staging form: Breast, AJCC 8th Edition - Clinical stage from 10/13/2023: Stage IIA (cT2, cN0, cM0, G3, ER-, PR-, HER2+) - Signed by Phillips Zelphia, MD on 10/24/2023 Stage prefix: Initial diagnosis Histologic grading system: 3 grade system   10/20/2023 Imaging   Bilateral MRI breast with and without contrast showed 1. Two sites of UPPER-OUTER RIGHT breast biopsy-proven malignancy spanning a total distance of 4.2 cm (see above). 2. No other MR evidence of malignancy within either breast. No abnormal appearing lymph nodes.   11/14/2023 -  Chemotherapy   Patient is on Treatment Plan : BREAST  Docetaxel + Carboplatin + Trastuzumab +  Pertuzumab  (TCHP) q21d        Status post cycle 1 treatment. She denies nausea vomiting. She has been experiencing diarrhea with streaks of blood, which has become more noticeable in recent episodes. The diarrhea began on Sunday, initially soft and becoming more watery by today. She has had two to three bowel movements in the past 24 hours. She took two doses of Imodium on Sunday night, one dose overnight, and two doses at 6:30 AM on Monday, which seems to have helped reduce the frequency of bowel movements. She has been using sitz baths and Vaseline to manage irritation from frequent bowel movements.  She describes a burning sensation on the left side of her throat, extending from her ear. No nausea or vomiting, but she feels tired, especially after receiving a Neulasta shot, which caused body aches.   MEDICAL HISTORY:  Past Medical History:  Diagnosis Date   Allergy    Penicillin, Aspirin   Anemia July 2023    Blood transfusion without reported diagnosis July 2023   Cancer [facility])    Goiter    Hypothyroid    Internal hemorrhoid, bleeding 12/2022   Iron  deficiency anemia    Pre-diabetes    Recurrent cold sores    Sinus tachycardia     SURGICAL HISTORY: Past Surgical History:  Procedure Laterality Date   APPENDECTOMY     BREAST BIOPSY Right 05/21/2009   benign   BREAST BIOPSY Right 09/28/2023   US  RT BREAST BX W LOC DEV 1ST LESION IMG BX SPEC US  GUIDE 09/28/2023 [facility]   BREAST BIOPSY Right 09/28/2023   US  RT BREAST BX W LOC DEV EA ADD LESION IMG BX SPEC US  GUIDE 09/28/2023 [facility]   CHOLECYSTECTOMY N/A 02/28/2019   Procedure: LAPAROSCOPIC CHOLECYSTECTOMY;  Surgeon: Desiderio Schanz, MD;  Location: [facility];  Service: General;  Laterality: N/A;   COLONOSCOPY WITH PROPOFOL  N/A 09/14/2021   Procedure: COLONOSCOPY WITH PROPOFOL ;  Surgeon: Jinny Carmine, MD;  Location: [facility] ENDOSCOPY;  Service: Endoscopy;  Laterality: N/A;   DE QUERVAIN'S RELEASE Right 06/2022   ESOPHAGOGASTRODUODENOSCOPY (EGD) WITH PROPOFOL  N/A 02/27/2019   Procedure: ESOPHAGOGASTRODUODENOSCOPY (EGD) WITH PROPOFOL ;  Surgeon: Unk Corinn Skiff, MD;  Location: [facility] ENDOSCOPY;  Service: Gastroenterology;  Laterality: N/A;   ESOPHAGOGASTRODUODENOSCOPY (EGD) WITH PROPOFOL  N/A 09/14/2021   Procedure: ESOPHAGOGASTRODUODENOSCOPY (EGD) WITH PROPOFOL ;  Surgeon: Jinny Carmine, MD;  Location: [facility] ENDOSCOPY;  Service: Endoscopy;  Laterality: N/A;   GIVENS CAPSULE STUDY N/A 09/15/2021   Procedure: GIVENS CAPSULE STUDY;  Surgeon: Jinny Carmine, MD;  Location: [facility] ENDOSCOPY;  Service: Endoscopy;  Laterality: N/A;   HEMORRHOID BANDING  10/20/2022   HEMORRHOID SURGERY N/A 12/28/2022   Procedure: HEMORRHOIDECTOMY;  Surgeon: Rodolph Romano, MD;  Location: [facility] ORS;  Service: General;  Laterality: N/A;   KNEE ARTHROSCOPY Left 01/04/2007   torn meniscus   PORTACATH PLACEMENT N/A 11/08/2023   Procedure: INSERTION, TUNNELED  CENTRAL VENOUS DEVICE, WITH PORT;  Surgeon: Rodolph Romano, MD;  Location: [facility] ORS;  Service: General;  Laterality: N/A;   VITRECTOMY Right 09/2022    SOCIAL HISTORY: Social History   Socioeconomic History   Marital status: Single    Spouse name: Not on file   Number of children: 3   Years of education: Not on file   Highest education level: Bachelor's degree (e.g., BA, AB, BS)  Occupational History   Not on file  Tobacco Use   Smoking status: Never   Smokeless tobacco: Never  Vaping Use   Vaping status: Never Used  Substance and Sexual Activity   Alcohol use: Never   Drug use: Never   Sexual activity: Not Currently    Birth control/protection: Abstinence, Post-menopausal  Other Topics Concern   Not on file  Social History Narrative   Lives alone   Social Drivers of Health   Financial Resource Strain: Low Risk  (07/31/2023)   Overall Financial Resource Strain (CARDIA)    Difficulty of Paying Living Expenses: Not hard at all  Food Insecurity: No Food Insecurity (07/31/2023)   Hunger Vital Sign    Worried About Running Out of Food in the Last Year: Never true    Ran Out of Food in the Last Year: Never true  Transportation Needs: No Transportation Needs (07/31/2023)   PRAPARE - Administrator, Civil Service (Medical): No    Lack of Transportation (Non-Medical): No  Physical Activity: Sufficiently Active (07/31/2023)   Exercise Vital Sign    Days of Exercise per Week: 4 days    Minutes of Exercise per Session: 50 min  Stress: No Stress Concern Present (07/31/2023)   [employer] of Occupational Health - Occupational Stress Questionnaire    Feeling of Stress: Not at all  Social Connections: Moderately Integrated (07/31/2023)   Social Connection and Isolation Panel    Frequency of Communication with Friends and Family: More than three times a week    Frequency of Social Gatherings with Friends and Family: More than three times a week    Attends  Religious Services: More than 4 times per year    Active Member of [employer] or Organizations: Yes    Attends Engineer, structural: More than 4 times per year    Marital Status: Divorced  Catering manager Violence: Not on file    FAMILY HISTORY: Family History  Problem Relation Age of Onset   Thyroid  disease Mother    Diabetes Mellitus II Mother    Diabetes Mother    Thyroid  disease Father    Healthy Sister    Hypothyroidism Daughter    Diabetes Son    Hypothyroidism Son    Diabetes Mellitus I Son    Kidney cancer Maternal Grandmother    Lung cancer Maternal Grandfather        smoker   Colon cancer Neg Hx    Breast cancer Neg Hx     ALLERGIES:  is allergic to aspirin and penicillin g.  MEDICATIONS:  Current Outpatient Medications  Medication Sig Dispense Refill   acetaminophen  (TYLENOL ) 500 MG tablet Take 1,000 mg by mouth every 6 (six) hours as needed (pain.).     calcium-vitamin D (OSCAL WITH D) 500-5 MG-MCG tablet Take 2 tablets by mouth 2 (two) times daily.     dexamethasone  (DECADRON ) 4 MG tablet Take 2 tabs by mouth 2 times daily starting day before chemo. Then take 2 tabs daily for 2 days starting day after chemo. Take with food. 30 tablet 1   ibuprofen  (ADVIL ) 200 MG tablet Take 400-600 mg by mouth every 8 (eight) hours as needed (pain.).     lidocaine -prilocaine  (EMLA ) cream Apply to affected area once 30 g 3   loperamide (IMODIUM) 2 MG capsule Take 1 capsule (2 mg total) by mouth See admin instructions. Initial: 4 mg,the 2 mg every 2 hours (4 mg every 4 hours at night)  maximum: 16 mg/day 60 capsule 2   magic mouthwash w/lidocaine  SOLN Take 5 mLs by mouth 4 (four) times daily as needed for mouth pain. Sig: Swish/Swallow 5-10 ml four  times a day as needed. Dispense 480 ml. 1RF 480 mL 1   Multiple Vitamin (MULTIVITAMIN WITH MINERALS) TABS tablet Take 1 tablet by mouth in the morning.     nystatin  (MYCOSTATIN ) 100000 UNIT/ML suspension Take 5 mLs (500,000 Units  total) by mouth 4 (four) times daily. 473 mL 0   ondansetron  (ZOFRAN ) 8 MG tablet Take 1 tablet (8 mg total) by mouth every 8 (eight) hours as needed for nausea or vomiting. Start on the third day after chemotherapy. 30 tablet 1   prochlorperazine  (COMPAZINE ) 10 MG tablet Take 1 tablet (10 mg total) by mouth every 6 (six) hours as needed for nausea or vomiting. 30 tablet 1   Psyllium (METAMUCIL PO) Take 3 capsules by mouth in the morning and at bedtime. 2  to 3 capules     SYNTHROID  88 MCG tablet TAKE 1 TABLET BY MOUTH DAILY BEFORE BREAKFAST. (Patient taking differently: Take 88 mcg by mouth at bedtime.) 90 tablet 1   magic mouthwash (multi-ingredient) oral suspension Swish and swallow 5-10 mLs 4 (four) times daily as needed for mouth pain. 480 mL 1   WORMWOOD,ARTEMISIA ABSINTHIUM, PO Take 1 capsule by mouth in the morning. Wormwood Black Walnut Clove Capsules     No current facility-administered medications for this visit.    Review of Systems  Constitutional:  Positive for fatigue. Negative for appetite change, chills and fever.  HENT:   Negative for hearing loss and voice change.   Eyes:  Negative for eye problems.  Respiratory:  Negative for chest tightness and cough.   Cardiovascular:  Negative for chest pain.  Gastrointestinal:  Positive for blood in stool. Negative for abdominal distention, abdominal pain and diarrhea.  Endocrine: Negative for hot flashes.  Genitourinary:  Negative for difficulty urinating and frequency.   Musculoskeletal:  Negative for arthralgias.  Skin:  Negative for itching and rash.  Neurological:  Negative for extremity weakness.  Hematological:  Negative for adenopathy.  Psychiatric/Behavioral:  Negative for confusion.     PHYSICAL EXAMINATION: ECOG PERFORMANCE STATUS: 1 - Symptomatic but completely ambulatory Vitals:   11/20/23 1101  BP: 122/86  Pulse: (!) 130  Resp: 18  Temp: 98.4 F (36.9 C)  SpO2: 99%   Filed Weights   11/20/23 1101  Weight:  155 lb (70.3 kg)    Physical Exam Constitutional:      General: She is not in acute distress. HENT:     Head: Normocephalic and atraumatic.  Eyes:     General: No scleral icterus. Cardiovascular:     Rate and Rhythm: Normal rate.  Pulmonary:     Effort: Pulmonary effort is normal. No respiratory distress.  Abdominal:     General: There is no distension.  Musculoskeletal:        General: Normal range of motion.     Cervical back: Normal range of motion and neck supple.  Skin:    Findings: No erythema.     Comments: + Medi port, mild erythematous changes at the port site. No drainage.    Neurological:     Mental Status: She is alert and oriented to person, place, and time. Mental status is at baseline.  Psychiatric:        Mood and Affect: Mood normal.      LABORATORY DATA:  I have reviewed the data as listed    Latest Ref Rng & Units 11/20/2023   10:33 AM 11/13/2023    8:15 AM 10/13/2023   12:26 PM  CBC  WBC  4.0 - 10.5 K/uL 2.3  8.0  5.7   Hemoglobin 12.0 - 15.0 g/dL 86.9  85.7  86.2   Hematocrit 36.0 - 46.0 % 38.0  41.2  40.5   Platelets 150 - 400 K/uL 86  189  205       Latest Ref Rng & Units 11/20/2023   10:33 AM 11/13/2023    8:14 AM 10/13/2023   12:26 PM  CMP  Glucose 70 - 99 mg/dL 92  882  96   BUN 6 - 20 mg/dL 17  21  18    Creatinine 0.44 - 1.00 mg/dL 9.41  9.24  9.21   Sodium 135 - 145 mmol/L 131  136  132   Potassium 3.5 - 5.1 mmol/L 3.9  3.9  4.2   Chloride 98 - 111 mmol/L 101  103  100   CO2 22 - 32 mmol/L 24  22  25    Calcium 8.9 - 10.3 mg/dL 8.6  9.4  9.6   Total Protein 6.5 - 8.1 g/dL 7.1  7.0  8.2   Total Bilirubin 0.0 - 1.2 mg/dL 0.6  0.6  1.0   Alkaline Phos 38 - 126 U/L 47  53  52   AST 15 - 41 U/L 25  23  23    ALT 0 - 44 U/L 34  25  27       Component Value Date/Time   IRON  86 04/10/2023 0815   IRON  26 (L) 11/26/2021 1336   TIBC 364 04/10/2023 0815   TIBC 424 11/26/2021 1336   FERRITIN 85 04/10/2023 0815   FERRITIN 11 (L) 11/26/2021  1336   IRONPCTSAT 24 04/10/2023 0815   IRONPCTSAT 6 (LL) 11/26/2021 1336     RADIOGRAPHIC STUDIES: I have personally reviewed the radiological images as listed and agreed with the findings in the report. DG Chest Port 1 View Result Date: 11/08/2023 EXAM: 1 VIEW(S) XRAY OF THE CHEST 11/08/2023 01:57:00 PM COMPARISON: None available. CLINICAL HISTORY: Port-A-Cath in place [phone]. S/p port placement. FINDINGS: LINES, TUBES AND DEVICES: Tunneled left IJ Port-A-Cath in place with tip at lower SVC level. LUNGS AND PLEURA: No focal pulmonary opacity. No pulmonary edema. No pleural effusion. No pneumothorax. HEART AND MEDIASTINUM: No acute abnormality of the cardiac and mediastinal silhouettes. BONES AND SOFT TISSUES: No acute osseous abnormality. IMPRESSION: 1. Tunneled left IJ Port-A-Cath with tip at the lower SVC. 2. No pneumothorax identified. Electronically signed by: Waddell Calk MD 11/08/2023 02:26 PM EDT RP Workstation: [id number]   DG C-Arm 1-60 Min-No Report Result Date: 11/08/2023 Fluoroscopy was utilized by the requesting physician.  No radiographic interpretation.   ECHOCARDIOGRAM COMPLETE Result Date: 10/25/2023    ECHOCARDIOGRAM REPORT   Patient Name:   CAMREIGH MICHIE Date of Exam: 10/25/2023 Medical Rec #:  [id number]    Height:       67.5 in Accession #:    [id number]   Weight:       158.6 lb Date of Birth:  [birth year]   BSA:          1.843 m Patient Age:    57 years     BP:           138/99 mmHg Patient Gender: F            HR:           73 bpm. Exam Location:  [facility] Procedure: 2D Echo, Color Doppler, Cardiac Doppler and Strain Analysis (Both  Spectral and Color Flow Doppler were utilized during procedure). Indications:     Chemo Z09  History:         Patient has no prior history of Echocardiogram examinations.  Sonographer:     Ashley McNeely-Sloane Referring Phys:  [id number] Ellajane Stong Diagnosing Phys: Marsa Dooms MD  Sonographer Comments: Global longitudinal strain was attempted.  IMPRESSIONS  1. Left ventricular ejection fraction, by estimation, is 65 to 70%. The left ventricle has normal function. The left ventricle has no regional wall motion abnormalities. Left ventricular diastolic parameters are consistent with Grade I diastolic dysfunction (impaired relaxation). The global longitudinal strain is normal.  2. Right ventricular systolic function is normal. The right ventricular size is normal.  3. The mitral valve is normal in structure. No evidence of mitral valve regurgitation. No evidence of mitral stenosis.  4. The aortic valve is normal in structure. Aortic valve regurgitation is not visualized. No aortic stenosis is present.  5. The inferior vena cava is normal in size with greater than 50% respiratory variability, suggesting right atrial pressure of 3 mmHg. FINDINGS  Left Ventricle: Left ventricular ejection fraction, by estimation, is 65 to 70%. The left ventricle has normal function. The left ventricle has no regional wall motion abnormalities. Strain was performed and the global longitudinal strain is normal. The  left ventricular internal cavity size was normal in size. There is no left ventricular hypertrophy. Left ventricular diastolic parameters are consistent with Grade I diastolic dysfunction (impaired relaxation). Right Ventricle: The right ventricular size is normal. No increase in right ventricular wall thickness. Right ventricular systolic function is normal. Left Atrium: Left atrial size was normal in size. Right Atrium: Right atrial size was normal in size. Pericardium: There is no evidence of pericardial effusion. Mitral Valve: The mitral valve is normal in structure. No evidence of mitral valve regurgitation. No evidence of mitral valve stenosis. MV peak gradient, 2.1 mmHg. The mean mitral valve gradient is 1.0 mmHg. Tricuspid Valve: The tricuspid valve is normal in structure. Tricuspid valve regurgitation is not demonstrated. No evidence of tricuspid stenosis.  Aortic Valve: The aortic valve is normal in structure. Aortic valve regurgitation is not visualized. No aortic stenosis is present. Aortic valve mean gradient measures 3.0 mmHg. Aortic valve peak gradient measures 4.7 mmHg. Aortic valve area, by VTI measures 1.77 cm. Pulmonic Valve: The pulmonic valve was normal in structure. Pulmonic valve regurgitation is mild. No evidence of pulmonic stenosis. Aorta: The aortic root is normal in size and structure. Venous: The inferior vena cava is normal in size with greater than 50% respiratory variability, suggesting right atrial pressure of 3 mmHg. IAS/Shunts: No atrial level shunt detected by color flow Doppler. Additional Comments: 3D was performed not requiring image post processing on an independent workstation and was indeterminate.  LEFT VENTRICLE PLAX 2D LVIDd:         4.00 cm     Diastology LVIDs:         1.60 cm     LV e' medial:    6.42 cm/s LV PW:         0.90 cm     LV E/e' medial:  8.5 LV IVS:        0.90 cm     LV e' lateral:   9.03 cm/s LVOT diam:     1.70 cm     LV E/e' lateral: 6.0 LV SV:         39 LV SV Index:   21 LVOT Area:  2.27 cm  LV Volumes (MOD) LV vol d, MOD A2C: 39.0 ml LV vol d, MOD A4C: 49.4 ml LV vol s, MOD A2C: 14.8 ml LV vol s, MOD A4C: 15.5 ml LV SV MOD A2C:     24.2 ml LV SV MOD A4C:     49.4 ml LV SV MOD BP:      29.3 ml RIGHT VENTRICLE RV Basal diam:  3.30 cm RV Mid diam:    2.10 cm RV S prime:     12.80 cm/s TAPSE (M-mode): 1.5 cm LEFT ATRIUM           Index        RIGHT ATRIUM           Index LA diam:      3.20 cm 1.74 cm/m   RA Area:     11.40 cm LA Vol (A4C): 18.7 ml 10.15 ml/m  RA Volume:   23.60 ml  12.81 ml/m  AORTIC VALVE                    PULMONIC VALVE AV Area (Vmax):    1.99 cm     PV Vmax:          0.85 m/s AV Area (Vmean):   1.96 cm     PV Vmean:         59.500 cm/s AV Area (VTI):     1.77 cm     PV VTI:           0.184 m AV Vmax:           108.00 cm/s  PV Peak grad:     2.9 mmHg AV Vmean:          73.000 cm/s  PV  Mean grad:     2.0 mmHg AV VTI:            0.223 m      PR End Diast Vel: 5.57 msec AV Peak Grad:      4.7 mmHg     RVOT Peak grad:   2 mmHg AV Mean Grad:      3.0 mmHg LVOT Vmax:         94.90 cm/s LVOT Vmean:        62.900 cm/s LVOT VTI:          0.174 m LVOT/AV VTI ratio: 0.78  AORTA Ao Root diam: 2.20 cm Ao Asc diam:  3.00 cm MITRAL VALVE               TRICUSPID VALVE MV Area (PHT): 3.42 cm    TR Peak grad:   5.5 mmHg MV Area VTI:   2.31 cm    TR Mean grad:   4.0 mmHg MV Peak grad:  2.1 mmHg    TR Vmax:        117.00 cm/s MV Mean grad:  1.0 mmHg    TR Vmean:       103.0 cm/s MV Vmax:       0.72 m/s MV Vmean:      48.4 cm/s   SHUNTS MV Decel Time: 222 msec    Systemic VTI:  0.17 m MV E velocity: 54.50 cm/s  Systemic Diam: 1.70 cm MV A velocity: 64.50 cm/s  Pulmonic VTI:  0.151 m MV E/A ratio:  0.84 Marsa Dooms MD Electronically signed by Marsa Dooms MD Signature Date/Time: 10/25/2023/1:45:52 PM    Final

## 2023-11-20 NOTE — Assessment & Plan Note (Signed)
 Recommend nystatin  oral rinse, prescription sent to pharmacy.

## 2023-11-20 NOTE — Telephone Encounter (Signed)
 Patient called & stated she went to [facility] and magic mouthwash was not available for pickup. Per Lanette, they do not accept outside RX for magic mouth wash/ limited stock. Please send to [facility] at [facility] instead.Number if needed [phone]. Please call & notify patient, thanks

## 2023-11-20 NOTE — Assessment & Plan Note (Addendum)
 Recommend patient to use Imodium PRN as directed If symptoms persist, plan to switch to Lomotil.  Small amount of blood in stool, possibly secondary to focal irritation/inflammation secondary to radiation. She has a history of hemorrhoid resection.  Recommend patient to see surgery for evaluation.  Patient will receive IV fluid hydration session today.

## 2023-11-20 NOTE — Telephone Encounter (Signed)
 Received critical ANC 0.3 from [place] at [facility] lab. Dr. Babara notified.

## 2023-11-20 NOTE — Assessment & Plan Note (Signed)
 Recommend neutropenic precaution.  She has received long-acting G-CSF

## 2023-11-22 ENCOUNTER — Inpatient Hospital Stay (HOSPITAL_BASED_OUTPATIENT_CLINIC_OR_DEPARTMENT_OTHER): Admitting: Hospice and Palliative Medicine

## 2023-11-22 ENCOUNTER — Encounter: Payer: Self-pay | Admitting: Oncology

## 2023-11-22 ENCOUNTER — Inpatient Hospital Stay

## 2023-11-22 ENCOUNTER — Encounter: Payer: Self-pay | Admitting: Hospice and Palliative Medicine

## 2023-11-22 ENCOUNTER — Other Ambulatory Visit: Payer: Self-pay

## 2023-11-22 ENCOUNTER — Telehealth: Payer: Self-pay | Admitting: *Deleted

## 2023-11-22 VITALS — BP 134/98 | HR 134 | Temp 98.1°F | Resp 18 | Ht 67.0 in | Wt 155.4 lb

## 2023-11-22 VITALS — BP 136/90 | HR 108 | Resp 18

## 2023-11-22 DIAGNOSIS — R000 Tachycardia, unspecified: Secondary | ICD-10-CM

## 2023-11-22 DIAGNOSIS — C50919 Malignant neoplasm of unspecified site of unspecified female breast: Secondary | ICD-10-CM | POA: Diagnosis not present

## 2023-11-22 DIAGNOSIS — Z5111 Encounter for antineoplastic chemotherapy: Secondary | ICD-10-CM | POA: Diagnosis not present

## 2023-11-22 DIAGNOSIS — E860 Dehydration: Secondary | ICD-10-CM | POA: Insufficient documentation

## 2023-11-22 MED ORDER — SODIUM CHLORIDE 0.9 % IV SOLN
INTRAVENOUS | Status: AC
  Filled 2023-11-22 (×2): qty 250

## 2023-11-22 NOTE — Patient Instructions (Signed)
 How to Prepare a Baking Soda Mouth Rinse To make a baking soda mouth rinse, mix 1/2 teaspoon of baking soda in a cup of warm water. Rinse your mouth thoroughly for 30 seconds, then spit it out. Repeat as needed throughout the day.

## 2023-11-22 NOTE — Telephone Encounter (Signed)
 The patient says that she does not know if she should do the nystatin  and the mebane Magic mouthwash.  She states that with the Magic mouthwash it can make it numb and she thinks that is not good if she eating, she thinks that maybe some food will get stuck.  I have talked to Dr. Babara as well as the patient and she is coming for [facility] at 10:30. I spoke to Dr. Babara and she can take both meds together(MMW, and  Nystatin )or she can take 1 med and then next time switch to other. Or she can swiss

## 2023-11-22 NOTE — Telephone Encounter (Signed)
 I meant swish with med and then spit it out. Do not eat food while Magic mouth wash. She was told on phone and she is ok with that .

## 2023-11-22 NOTE — Progress Notes (Signed)
 Symptom Management Clinic [facility] at [facility] [phone] [phone]  Patient Care Team: Myrla Jon HERO, MD as PCP - General (Family Medicine) [name], MD as Consulting Physician (Oncology)   NAME OF PATIENT: Annette Phillips  [id number]  06/12/1965   DATE OF VISIT: 11/22/23  REASON FOR CONSULT: Annette Phillips is a 58 y.o. 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   INTERVAL HISTORY: Patient on neoadjuvant TCHP chemotherapy.  She presents to [facility] today for evaluation of oral pain.  She was seen on 11/20/2023 by Dr. Babara with note of mucositis and thrush.  She was started on Magic mouthwash and nystatin  oral rinse.  Patient says she has been using her Magic mouthwash and oral nystatin  with some improvement in symptoms.  Oral intake remains poor.  Patient says she has been trying to drink fluids but is having a challenge eating solid foods due to pain.  Denies fever or chills.  Denies any neurologic complaints. Denies recent fevers or illnesses. Denies any easy bleeding or bruising. Denies chest pain. Denies any nausea, vomiting, constipation, or diarrhea. Denies urinary complaints. Patient offers no further specific complaints today.   PAST MEDICAL HISTORY: Past Medical History:  Diagnosis Date   Allergy    Penicillin, Aspirin   Anemia July 2023   Blood transfusion without reported diagnosis July 2023   Cancer [facility])    Goiter    Hypothyroid    Internal hemorrhoid, bleeding 12/2022   Iron  deficiency anemia    Pre-diabetes    Recurrent cold sores    Sinus tachycardia     PAST SURGICAL HISTORY:  Past Surgical History:  Procedure Laterality Date   APPENDECTOMY     BREAST BIOPSY Right 05/21/2009   benign   BREAST BIOPSY Right 09/28/2023   US  RT BREAST BX W LOC DEV 1ST LESION IMG BX SPEC US  GUIDE 09/28/2023 [facility]   BREAST BIOPSY Right 09/28/2023    US  RT BREAST BX W LOC DEV EA ADD LESION IMG BX SPEC US  GUIDE 09/28/2023 [facility]   CHOLECYSTECTOMY N/A 02/28/2019   Procedure: LAPAROSCOPIC CHOLECYSTECTOMY;  Surgeon: Desiderio Schanz, MD;  Location: [facility] OR[facility];  Service: General;  Laterality: N/A;   COLONOSCOPY WITH PROPOFOL  N/A 09/14/2021   Procedure: COLONOSCOPY WITH PROPOFOL ;  Surgeon: Jinny Carmine, MD;  Location: [facility] ENDOSCOPY;  Service: Endoscopy;  Laterality: N/A;   DE QUERVAIN'S RELEASE Right 06/2022   ESOPHAGOGASTRODUODENOSCOPY (EGD) WITH PROPOFOL  N/A 02/27/2019   Procedure: ESOPHAGOGASTRODUODENOSCOPY (EGD) WITH PROPOFOL ;  Surgeon: Unk Corinn Skiff, MD;  Location: [facility] ENDOSCOPY;  Service: Gastroenterology;  Laterality: N/A;   ESOPHAGOGASTRODUODENOSCOPY (EGD) WITH PROPOFOL  N/A 09/14/2021   Procedure: ESOPHAGOGASTRODUODENOSCOPY (EGD) WITH PROPOFOL ;  Surgeon: Jinny Carmine, MD;  Location: [facility] ENDOSCOPY;  Service: Endoscopy;  Laterality: N/A;   GIVENS CAPSULE STUDY N/A 09/15/2021   Procedure: GIVENS CAPSULE STUDY;  Surgeon: Jinny Carmine, MD;  Location: [facility] ENDOSCOPY;  Service: Endoscopy;  Laterality: N/A;   HEMORRHOID BANDING  10/20/2022   HEMORRHOID SURGERY N/A 12/28/2022   Procedure: HEMORRHOIDECTOMY;  Surgeon: Rodolph Romano, MD;  Location: [facility] ORS;  Service: General;  Laterality: N/A;   KNEE ARTHROSCOPY Left 01/04/2007   torn meniscus   PORTACATH PLACEMENT N/A 11/08/2023   Procedure: INSERTION, TUNNELED CENTRAL VENOUS DEVICE, WITH PORT;  Surgeon: Rodolph Romano, MD;  Location: [facility]S;  Service: General;  Laterality: N/A;   VITRECTOMY Right 09/2022    HEMATOLOGY/ONCOLOGY HISTORY:  Oncology History  Invasive carcinoma of breast (HCC)  09/18/2023 Mammogram   Bilateral screening mammogram  In the right breast, possible asymmetries warrant further evaluation. In the left breast, no findings suspicious for malignancy.   09/22/2023 Mammogram   Right breast diagnostic mammogram   1. There is a highly  suspicious 10 mm mass in the RIGHT upper inner breast. Recommend ultrasound-guided biopsy for definitive characterization. 2. There is a sonographically identified additional 6 mm mass in the RIGHT upper inner breast which is indeterminate. Recommend ultrasound-guided biopsy for definitive characterization. 3. No suspicious RIGHT axillary adenopathy.   09/28/2023 Initial Diagnosis   Invasive carcinoma of breast (HCC)  S/p right breast biopsy.   1. Breast, right, needle core biopsy, 2:00 6cmfn 10mm (ribbon clip) - INVASIVE DUCTAL CARCINOMA, SEE NOTE - TUBULE FORMATION: SCORE 3 - NUCLEAR PLEOMORPHISM: SCORE 3 - MITOTIC COUNT: SCORE 3 - TOTAL SCORE: 9 - OVERALL GRADE: 3 - LYMPHOVASCULAR INVASION: NOT IDENTIFIED - CANCER LENGTH: 0.7 CM - CALCIFICATIONS: NOT IDENTIFIED - OTHER FINDINGS: NONE 2. Breast, right, needle core biopsy, 1:00 3cmfn 6mm (venus clip) - INVASIVE DUCTAL CARCINOMA, SEE NOTE - TUBULE FORMATION: SCORE 3 - NUCLEAR PLEOMORPHISM: SCORE 3 - MITOTIC COUNT: SCORE 3 - TOTAL SCORE: 9 - OVERALL GRADE: 3 - LYMPHOVASCULAR INVASION: NOT IDENTIFIED - CANCER LENGTH: 0.5 CM - CALCIFICATIONS: NOT IDENTIFIED - OTHER FINDINGS: NONE  1. 1) Breast, right, needle core biopsy, 2:00 6 cmfn 10mm ( ribbon clip) PROGNOSTIC INDICATORS Results: IMMUNOHISTOCHEMICAL AND MORPHOMETRIC ANALYSIS PERFORMED MANUALLY The tumor cells are POSITIVE for Her2 (3+). Estrogen Receptor: 0%, NEGATIVE Progesterone Receptor: 0%, NEGATIVE Proliferation Marker Ki67: 50%     10/13/2023 Cancer Staging   Staging form: Breast, AJCC 8th Edition - Clinical stage from 10/13/2023: Stage IIA (cT2, cN0, cM0, G3, ER-, PR-, HER2+) - Signed by [name], MD on 10/24/2023 Stage prefix: Initial diagnosis Histologic grading system: 3 grade system   10/20/2023 Imaging   Bilateral MRI breast with and without contrast showed 1. Two sites of UPPER-OUTER RIGHT breast biopsy-proven malignancy spanning a total distance of 4.2  cm (see above). 2. No other MR evidence of malignancy within either breast. No abnormal appearing lymph nodes.   11/14/2023 -  Chemotherapy   Patient is on Treatment Plan : BREAST  Docetaxel + Carboplatin + Trastuzumab + Pertuzumab  (TCHP) q21d        ALLERGIES:  is allergic to aspirin and penicillin g.  MEDICATIONS:  Current Outpatient Medications  Medication Sig Dispense Refill   acetaminophen  (TYLENOL ) 500 MG tablet Take 1,000 mg by mouth every 6 (six) hours as needed (pain.).     calcium-vitamin D (OSCAL WITH D) 500-5 MG-MCG tablet Take 2 tablets by mouth 2 (two) times daily.     dexamethasone  (DECADRON ) 4 MG tablet Take 2 tabs by mouth 2 times daily starting day before chemo. Then take 2 tabs daily for 2 days starting day after chemo. Take with food. 30 tablet 1   ibuprofen  (ADVIL ) 200 MG tablet Take 400-600 mg by mouth every 8 (eight) hours as needed (pain.).     lidocaine -prilocaine  (EMLA ) cream Apply to affected area once 30 g 3   loperamide (IMODIUM) 2 MG capsule Take 1 capsule (2 mg total) by mouth See admin instructions. Initial: 4 mg,the 2 mg every 2 hours (4 mg every 4 hours at night)  maximum: 16 mg/day 60 capsule 2   magic mouthwash (multi-ingredient) oral suspension Swish and swallow 5-10 mLs 4 (four) times daily as needed for mouth pain. 480 mL 1   magic mouthwash w/lidocaine   SOLN Take 5 mLs by mouth 4 (four) times daily as needed for mouth pain. Sig: Swish/Swallow 5-10 ml four times a day as needed. Dispense 480 ml. 1RF 480 mL 1   Multiple Vitamin (MULTIVITAMIN WITH MINERALS) TABS tablet Take 1 tablet by mouth in the morning.     nystatin  (MYCOSTATIN ) 100000 UNIT/ML suspension Take 5 mLs (500,000 Units total) by mouth 4 (four) times daily. 473 mL 0   ondansetron  (ZOFRAN ) 8 MG tablet Take 1 tablet (8 mg total) by mouth every 8 (eight) hours as needed for nausea or vomiting. Start on the third day after chemotherapy. 30 tablet 1   prochlorperazine  (COMPAZINE ) 10 MG tablet Take  1 tablet (10 mg total) by mouth every 6 (six) hours as needed for nausea or vomiting. 30 tablet 1   Psyllium (METAMUCIL PO) Take 3 capsules by mouth in the morning and at bedtime. 2  to 3 capules     SYNTHROID  88 MCG tablet TAKE 1 TABLET BY MOUTH DAILY BEFORE BREAKFAST. (Patient taking differently: Take 88 mcg by mouth at bedtime.) 90 tablet 1   WORMWOOD,ARTEMISIA ABSINTHIUM, PO Take 1 capsule by mouth in the morning. Wormwood Black Walnut Clove Capsules     No current facility-administered medications for this visit.    VITAL SIGNS: There were no vitals taken for this visit. There were no vitals filed for this visit.  Estimated body mass index is 24.28 kg/m as calculated from the following:   Height as of 11/20/23: 5' 7 (1.702 m).   Weight as of 11/20/23: 155 lb (70.3 kg).  LABS: CBC:    Component Value Date/Time   WBC 2.3 (L) 11/20/2023 1033   WBC 5.7 10/13/2023 1226   HGB 13.0 11/20/2023 1033   HGB 8.7 (L) 11/26/2021 1336   HCT 38.0 11/20/2023 1033   HCT 29.4 (L) 11/26/2021 1336   PLT 86 (L) 11/20/2023 1033   PLT 331 11/26/2021 1336   MCV 88.6 11/20/2023 1033   MCV 86 11/26/2021 1336   NEUTROABS 0.5 (L) 11/20/2023 1033   NEUTROABS 3.3 11/26/2021 1336   LYMPHSABS 1.3 11/20/2023 1033   LYMPHSABS 1.7 11/26/2021 1336   MONOABS 0.4 11/20/2023 1033   EOSABS 0.0 11/20/2023 1033   EOSABS 0.1 11/26/2021 1336   BASOSABS 0.0 11/20/2023 1033   BASOSABS 0.0 11/26/2021 1336   Comprehensive Metabolic Panel:    Component Value Date/Time   NA 131 (L) 11/20/2023 1033   NA 136 08/01/2023 0000   K 3.9 11/20/2023 1033   CL 101 11/20/2023 1033   CO2 24 11/20/2023 1033   BUN 17 11/20/2023 1033   BUN 19 08/01/2023 0000   CREATININE 0.58 11/20/2023 1033   GLUCOSE 92 11/20/2023 1033   CALCIUM 8.6 (L) 11/20/2023 1033   AST 25 11/20/2023 1033   ALT 34 11/20/2023 1033   ALKPHOS 47 11/20/2023 1033   BILITOT 0.6 11/20/2023 1033   PROT 7.1 11/20/2023 1033   PROT 7.7 08/01/2023 0000    ALBUMIN 3.6 11/20/2023 1033   ALBUMIN 4.5 08/01/2023 0000    RADIOGRAPHIC STUDIES: DG Chest Port 1 View Result Date: 11/08/2023 EXAM: 1 VIEW(S) XRAY OF THE CHEST 11/08/2023 01:57:00 PM COMPARISON: None available. CLINICAL HISTORY: Port-A-Cath in place [phone]. S/p port placement. FINDINGS: LINES, TUBES AND DEVICES: Tunneled left IJ Port-A-Cath in place with tip at lower SVC level. LUNGS AND PLEURA: No focal pulmonary opacity. No pulmonary edema. No pleural effusion. No pneumothorax. HEART AND MEDIASTINUM: No acute abnormality of the cardiac and mediastinal silhouettes. BONES  AND SOFT TISSUES: No acute osseous abnormality. IMPRESSION: 1. Tunneled left IJ Port-A-Cath with tip at the lower SVC. 2. No pneumothorax identified. Electronically signed by: Waddell Calk MD 11/08/2023 02:26 PM EDT RP Workstation: [id number]   DG C-Arm 1-60 Min-No Report Result Date: 11/08/2023 Fluoroscopy was utilized by the requesting physician.  No radiographic interpretation.   ECHOCARDIOGRAM COMPLETE Result Date: 10/25/2023    ECHOCARDIOGRAM REPORT   Patient Name:   MALYA CIRILLO Date of Exam: 10/25/2023 Medical Rec #:  [id number]    Height:       67.5 in Accession #:    [id number]   Weight:       158.6 lb Date of Birth:  1966-02-11   BSA:          1.843 m Patient Age:    57 years     BP:           138/99 mmHg Patient Gender: F            HR:           73 bpm. Exam Location:  [facility] Procedure: 2D Echo, Color Doppler, Cardiac Doppler and Strain Analysis (Both            Spectral and Color Flow Doppler were utilized during procedure). Indications:     Chemo Z09  History:         Patient has no prior history of Echocardiogram examinations.  Sonographer:     Ashley McNeely-Sloane Referring Phys:  [id number] ZHOU YU Diagnosing Phys: Marsa Dooms MD  Sonographer Comments: Global longitudinal strain was attempted. IMPRESSIONS  1. Left ventricular ejection fraction, by estimation, is 65 to 70%. The left ventricle has normal function.  The left ventricle has no regional wall motion abnormalities. Left ventricular diastolic parameters are consistent with Grade I diastolic dysfunction (impaired relaxation). The global longitudinal strain is normal.  2. Right ventricular systolic function is normal. The right ventricular size is normal.  3. The mitral valve is normal in structure. No evidence of mitral valve regurgitation. No evidence of mitral stenosis.  4. The aortic valve is normal in structure. Aortic valve regurgitation is not visualized. No aortic stenosis is present.  5. The inferior vena cava is normal in size with greater than 50% respiratory variability, suggesting right atrial pressure of 3 mmHg. FINDINGS  Left Ventricle: Left ventricular ejection fraction, by estimation, is 65 to 70%. The left ventricle has normal function. The left ventricle has no regional wall motion abnormalities. Strain was performed and the global longitudinal strain is normal. The  left ventricular internal cavity size was normal in size. There is no left ventricular hypertrophy. Left ventricular diastolic parameters are consistent with Grade I diastolic dysfunction (impaired relaxation). Right Ventricle: The right ventricular size is normal. No increase in right ventricular wall thickness. Right ventricular systolic function is normal. Left Atrium: Left atrial size was normal in size. Right Atrium: Right atrial size was normal in size. Pericardium: There is no evidence of pericardial effusion. Mitral Valve: The mitral valve is normal in structure. No evidence of mitral valve regurgitation. No evidence of mitral valve stenosis. MV peak gradient, 2.1 mmHg. The mean mitral valve gradient is 1.0 mmHg. Tricuspid Valve: The tricuspid valve is normal in structure. Tricuspid valve regurgitation is not demonstrated. No evidence of tricuspid stenosis. Aortic Valve: The aortic valve is normal in structure. Aortic valve regurgitation is not visualized. No aortic stenosis is  present. Aortic valve mean gradient measures 3.0 mmHg. Aortic valve peak gradient measures  4.7 mmHg. Aortic valve area, by VTI measures 1.77 cm. Pulmonic Valve: The pulmonic valve was normal in structure. Pulmonic valve regurgitation is mild. No evidence of pulmonic stenosis. Aorta: The aortic root is normal in size and structure. Venous: The inferior vena cava is normal in size with greater than 50% respiratory variability, suggesting right atrial pressure of 3 mmHg. IAS/Shunts: No atrial level shunt detected by color flow Doppler. Additional Comments: 3D was performed not requiring image post processing on an independent workstation and was indeterminate.  LEFT VENTRICLE PLAX 2D LVIDd:         4.00 cm     Diastology LVIDs:         1.60 cm     LV e' medial:    6.42 cm/s LV PW:         0.90 cm     LV E/e' medial:  8.5 LV IVS:        0.90 cm     LV e' lateral:   9.03 cm/s LVOT diam:     1.70 cm     LV E/e' lateral: 6.0 LV SV:         39 LV SV Index:   21 LVOT Area:     2.27 cm  LV Volumes (MOD) LV vol d, MOD A2C: 39.0 ml LV vol d, MOD A4C: 49.4 ml LV vol s, MOD A2C: 14.8 ml LV vol s, MOD A4C: 15.5 ml LV SV MOD A2C:     24.2 ml LV SV MOD A4C:     49.4 ml LV SV MOD BP:      29.3 ml RIGHT VENTRICLE RV Basal diam:  3.30 cm RV Mid diam:    2.10 cm RV S prime:     12.80 cm/s TAPSE (M-mode): 1.5 cm LEFT ATRIUM           Index        RIGHT ATRIUM           Index LA diam:      3.20 cm 1.74 cm/m   RA Area:     11.40 cm LA Vol (A4C): 18.7 ml 10.15 ml/m  RA Volume:   23.60 ml  12.81 ml/m  AORTIC VALVE                    PULMONIC VALVE AV Area (Vmax):    1.99 cm     PV Vmax:          0.85 m/s AV Area (Vmean):   1.96 cm     PV Vmean:         59.500 cm/s AV Area (VTI):     1.77 cm     PV VTI:           0.184 m AV Vmax:           108.00 cm/s  PV Peak grad:     2.9 mmHg AV Vmean:          73.000 cm/s  PV Mean grad:     2.0 mmHg AV VTI:            0.223 m      PR End Diast Vel: 5.57 msec AV Peak Grad:      4.7 mmHg     RVOT  Peak grad:   2 mmHg AV Mean Grad:      3.0 mmHg LVOT Vmax:         94.90 cm/s LVOT Vmean:  62.900 cm/s LVOT VTI:          0.174 m LVOT/AV VTI ratio: 0.78  AORTA Ao Root diam: 2.20 cm Ao Asc diam:  3.00 cm MITRAL VALVE               TRICUSPID VALVE MV Area (PHT): 3.42 cm    TR Peak grad:   5.5 mmHg MV Area VTI:   2.31 cm    TR Mean grad:   4.0 mmHg MV Peak grad:  2.1 mmHg    TR Vmax:        117.00 cm/s MV Mean grad:  1.0 mmHg    TR Vmean:       103.0 cm/s MV Vmax:       0.72 m/s MV Vmean:      48.4 cm/s   SHUNTS MV Decel Time: 222 msec    Systemic VTI:  0.17 m MV E velocity: 54.50 cm/s  Systemic Diam: 1.70 cm MV A velocity: 64.50 cm/s  Pulmonic VTI:  0.151 m MV E/A ratio:  0.84 Marsa Dooms MD Electronically signed by Marsa Dooms MD Signature Date/Time: 10/25/2023/1:45:52 PM    Final     PERFORMANCE STATUS (ECOG) : 1 - Symptomatic but completely ambulatory  Review of Systems Unless otherwise noted, a complete review of systems is negative.  Physical Exam General: NAD Cardiovascular: tachycardic, regular rhythm Pulmonary: clear ant/post fields Abdomen: soft, nontender, + bowel sounds GU: no suprapubic tenderness Extremities: no edema, no joint deformities Skin: no rashes Neurological:  nonfocal  IMPRESSION/PLAN: Breast cancer -on TCHP chemotherapy  Mucositis -residual thrush posterior tongue.  Patient reports that pain is slightly better today.  Discussed option of rotating to fluconazole and adding sucralfate .  However, patient is not interested in adding any additional medications at this time.  She would like to continue nystatin  and Magic mouthwash.  Suggested that she could try baking soda salt rinses.  Discussed importance of oral hygiene and recommend nutritional supplements.  Will proceed with IV fluids today.  Tachycardia -Has history of sinus tachycardia. Patient did have mild hyponatremia 2 days ago which I suspect reflects dehydration.  Will proceed with IV  fluids today. Discussed with Dr. Babara and will also send referral to cardiology.   NP follow up next week  Case and plan discussed with Dr. Babara  Patient expressed understanding and was in agreement with this plan. She also understands that She can call clinic at any time with any questions, concerns, or complaints.   Thank you for allowing me to participate in the care of this very pleasant patient.   Time Total: 25 minutes  Visit consisted of counseling and education dealing with the complex and emotionally intense issues of symptom management in the setting of serious illness.Greater than 50%  of this time was spent counseling and coordinating care related to the above assessment and plan.  Signed by: Fonda Mower, PhD, NP-C

## 2023-11-26 ENCOUNTER — Other Ambulatory Visit: Payer: Self-pay | Admitting: Oncology

## 2023-11-26 ENCOUNTER — Telehealth: Payer: Self-pay | Admitting: Oncology

## 2023-11-26 MED ORDER — PREDNISONE 10 MG (21) PO TBPK: ORAL_TABLET | ORAL | 0 refills | Status: DC

## 2023-11-26 NOTE — Telephone Encounter (Signed)
 Patient called to complain itchy rash on her face. She is concerned that this will affect her breathing. Currently she denies any swelling of tongue and lip. She denies difficult breathing.  She stopped using nystatin  mouth wash and magic mouth wash due to side effects.  Epistaxis last night, stopped after applying pressure for 5 minutes.  I recommend a tapering course of prednisone to help chemotherapy induced rash.  Discussed with patient about ER triggers ( difficulty breathing, recurrent severe or non stop nose bleeding). She voices understanding.

## 2023-11-27 ENCOUNTER — Telehealth: Payer: Self-pay

## 2023-11-27 ENCOUNTER — Inpatient Hospital Stay

## 2023-11-27 ENCOUNTER — Inpatient Hospital Stay: Admitting: Nurse Practitioner

## 2023-11-27 ENCOUNTER — Encounter: Payer: Self-pay | Admitting: Nurse Practitioner

## 2023-11-27 VITALS — BP 121/82 | HR 115 | Temp 97.3°F | Resp 16 | Ht 67.0 in | Wt 155.4 lb

## 2023-11-27 DIAGNOSIS — L270 Generalized skin eruption due to drugs and medicaments taken internally: Secondary | ICD-10-CM

## 2023-11-27 DIAGNOSIS — Z8051 Family history of malignant neoplasm of kidney: Secondary | ICD-10-CM

## 2023-11-27 DIAGNOSIS — T451X5A Adverse effect of antineoplastic and immunosuppressive drugs, initial encounter: Secondary | ICD-10-CM

## 2023-11-27 DIAGNOSIS — Z1731 Human epidermal growth factor receptor 2 positive status: Secondary | ICD-10-CM | POA: Diagnosis not present

## 2023-11-27 DIAGNOSIS — C50919 Malignant neoplasm of unspecified site of unspecified female breast: Secondary | ICD-10-CM

## 2023-11-27 DIAGNOSIS — Z801 Family history of malignant neoplasm of trachea, bronchus and lung: Secondary | ICD-10-CM

## 2023-11-27 DIAGNOSIS — Z171 Estrogen receptor negative status [ER-]: Secondary | ICD-10-CM

## 2023-11-27 DIAGNOSIS — C50211 Malignant neoplasm of upper-inner quadrant of right female breast: Secondary | ICD-10-CM | POA: Diagnosis not present

## 2023-11-27 DIAGNOSIS — Z5111 Encounter for antineoplastic chemotherapy: Secondary | ICD-10-CM | POA: Diagnosis not present

## 2023-11-27 DIAGNOSIS — Z1722 Progesterone receptor negative status: Secondary | ICD-10-CM

## 2023-11-27 DIAGNOSIS — R945 Abnormal results of liver function studies: Secondary | ICD-10-CM

## 2023-11-27 DIAGNOSIS — Z09 Encounter for follow-up examination after completed treatment for conditions other than malignant neoplasm: Secondary | ICD-10-CM

## 2023-11-27 DIAGNOSIS — Z452 Encounter for adjustment and management of vascular access device: Secondary | ICD-10-CM

## 2023-11-27 DIAGNOSIS — K1231 Oral mucositis (ulcerative) due to antineoplastic therapy: Secondary | ICD-10-CM

## 2023-11-27 LAB — CBC WITH DIFFERENTIAL (CANCER CENTER ONLY)
Abs Immature Granulocytes: 0.94 K/uL — ABNORMAL HIGH (ref 0.00–0.07)
Basophils Absolute: 0 K/uL (ref 0.0–0.1)
Basophils Relative: 0 %
Eosinophils Absolute: 0 K/uL (ref 0.0–0.5)
Eosinophils Relative: 0 %
HCT: 34.5 % — ABNORMAL LOW (ref 36.0–46.0)
Hemoglobin: 11.8 g/dL — ABNORMAL LOW (ref 12.0–15.0)
Immature Granulocytes: 5 %
Lymphocytes Relative: 7 %
Lymphs Abs: 1.2 K/uL (ref 0.7–4.0)
MCH: 30.6 pg (ref 26.0–34.0)
MCHC: 34.2 g/dL (ref 30.0–36.0)
MCV: 89.6 fL (ref 80.0–100.0)
Monocytes Absolute: 0.8 K/uL (ref 0.1–1.0)
Monocytes Relative: 5 %
Neutro Abs: 14.6 K/uL — ABNORMAL HIGH (ref 1.7–7.7)
Neutrophils Relative %: 83 %
Platelet Count: 135 K/uL — ABNORMAL LOW (ref 150–400)
RBC: 3.85 MIL/uL — ABNORMAL LOW (ref 3.87–5.11)
RDW: 13.2 % (ref 11.5–15.5)
WBC Count: 17.6 K/uL — ABNORMAL HIGH (ref 4.0–10.5)
nRBC: 0 % (ref 0.0–0.2)

## 2023-11-27 LAB — CMP (CANCER CENTER ONLY)
ALT: 82 U/L — ABNORMAL HIGH (ref 0–44)
AST: 55 U/L — ABNORMAL HIGH (ref 15–41)
Albumin: 3.1 g/dL — ABNORMAL LOW (ref 3.5–5.0)
Alkaline Phosphatase: 77 U/L (ref 38–126)
Anion gap: 9 (ref 5–15)
BUN: 14 mg/dL (ref 6–20)
CO2: 24 mmol/L (ref 22–32)
Calcium: 8.9 mg/dL (ref 8.9–10.3)
Chloride: 100 mmol/L (ref 98–111)
Creatinine: 0.73 mg/dL (ref 0.44–1.00)
GFR, Estimated: >60 mL/min (ref >60)
Glucose, Bld: 156 mg/dL — ABNORMAL HIGH (ref 70–99)
Potassium: 3.9 mmol/L (ref 3.5–5.1)
Sodium: 133 mmol/L — ABNORMAL LOW (ref 135–145)
Total Bilirubin: 0.5 mg/dL (ref 0.0–1.2)
Total Protein: 7.2 g/dL (ref 6.5–8.1)

## 2023-11-27 MED ORDER — HYDROCORTISONE 1 % EX OINT: TOPICAL_OINTMENT | CUTANEOUS | 0 refills | Status: DC

## 2023-11-27 NOTE — Progress Notes (Signed)
 No IVF needed today.

## 2023-11-27 NOTE — Progress Notes (Signed)
 Rash broken out on her face over the weekend. Called Dr Babara over the weekend. Here for re eval and possible fluids. Eating and drinking. Using baking soda rinse.Mouth soreness improved. Drinking 1-2 Ensure per day. Dr Cesar Ainsley placed her port. Fluid nurse reported port has flipped. Pt states nurse did not try and access. PIV in R AC.

## 2023-11-27 NOTE — Progress Notes (Signed)
 Port has flipped unable to access port . Notified Daphene Dawn NP. Placed PIV and collected labs.

## 2023-11-27 NOTE — Progress Notes (Signed)
 Hematology/Oncology Progress Note [phone] [phone]    CHIEF COMPLAINTS/REASON FOR VISIT:  Stage IIA HER2 positive breast cancer-chemotherapy follow-up  HISTORY OF PRESENTING ILLNESS:  Annette Phillips is a  58 y.o.  female with PMH listed below who presents for follow up   History of iron  deficiency anemia.  09/14/21 EGD: Small hiatal hernia, nonobstructing Schatzki ring, stomach and duodenum normal.  Colonoscopy: Bleeding hemorrhoids grade 3.  10/08/21 capsule study negative.  S/p hemorrhoid banding in Sept 2024, recently started to have rectal bleeding.   + fatigue. + right side abdomen discomfort, intermittently. Recent US  showed no acute finding. Fatty liver disease. History of cholecystectomy.  She denies recent chest pain on exertion, pre-syncopal episodes, or palpitations She works as Careers adviser  Oncology History  Invasive carcinoma of breast (HCC)  09/18/2023 Mammogram   Bilateral screening mammogram  In the right breast, possible asymmetries warrant further evaluation. In the left breast, no findings suspicious for malignancy.   09/22/2023 Mammogram   Right breast diagnostic mammogram   1. There is a highly suspicious 10 mm mass in the RIGHT upper inner breast. Recommend ultrasound-guided biopsy for definitive characterization. 2. There is a sonographically identified additional 6 mm mass in the RIGHT upper inner breast which is indeterminate. Recommend ultrasound-guided biopsy for definitive characterization. 3. No suspicious RIGHT axillary adenopathy.   09/28/2023 Initial Diagnosis   Invasive carcinoma of breast (HCC)  S/p right breast biopsy.   1. Breast, right, needle core biopsy, 2:00 6cmfn 10mm (ribbon clip) - INVASIVE DUCTAL CARCINOMA, SEE NOTE - TUBULE FORMATION: SCORE 3 - NUCLEAR PLEOMORPHISM: SCORE 3 - MITOTIC COUNT: SCORE 3 - TOTAL SCORE: 9 - OVERALL GRADE: 3 - LYMPHOVASCULAR INVASION: NOT  IDENTIFIED - CANCER LENGTH: 0.7 CM - CALCIFICATIONS: NOT IDENTIFIED - OTHER FINDINGS: NONE 2. Breast, right, needle core biopsy, 1:00 3cmfn 6mm (venus clip) - INVASIVE DUCTAL CARCINOMA, SEE NOTE - TUBULE FORMATION: SCORE 3 - NUCLEAR PLEOMORPHISM: SCORE 3 - MITOTIC COUNT: SCORE 3 - TOTAL SCORE: 9 - OVERALL GRADE: 3 - LYMPHOVASCULAR INVASION: NOT IDENTIFIED - CANCER LENGTH: 0.5 CM - CALCIFICATIONS: NOT IDENTIFIED - OTHER FINDINGS: NONE  1. 1) Breast, right, needle core biopsy, 2:00 6 cmfn 10mm ( ribbon clip) PROGNOSTIC INDICATORS Results: IMMUNOHISTOCHEMICAL AND MORPHOMETRIC ANALYSIS PERFORMED MANUALLY The tumor cells are POSITIVE for Her2 (3+). Estrogen Receptor: 0%, NEGATIVE Progesterone Receptor: 0%, NEGATIVE Proliferation Marker Ki67: 50%     10/13/2023 Cancer Staging   Staging form: Breast, AJCC 8th Edition - Clinical stage from 10/13/2023: Stage IIA (cT2, cN0, cM0, G3, ER-, PR-, HER2+) - Signed by [name], MD on 10/24/2023 Stage prefix: Initial diagnosis Histologic grading system: 3 grade system   10/20/2023 Imaging   Bilateral MRI breast with and without contrast showed 1. Two sites of UPPER-OUTER RIGHT breast biopsy-proven malignancy spanning a total distance of 4.2 cm (see above). 2. No other MR evidence of malignancy within either breast. No abnormal appearing lymph nodes.   11/14/2023 -  Chemotherapy   Patient is on Treatment Plan : BREAST  Docetaxel + Carboplatin + Trastuzumab + Pertuzumab  (TCHP) q21d       Interval history: Annette Phillips is a 58 y.o. female currently status post cycle 1 of TCHP chemotherapy with G-CSF support.  Treatment was complicated by mucositis.  She says that she was unable to tolerate Magic mouthwash or nystatin  due to possible allergy versus side effects.  She does not want to retry these medications.  Symptoms have improved with baking soda  and salt water rinses.  Nursing was unable to access her port today.  She is worried about a facial  rash.  Was started on prednisone by medical oncology.  Symptoms are improving.  With mucositis improved her intake is also improved.  She is now eating and drinking normally.  Weight is stable.  She had a nosebleed that resolved.  She has been using saline to stay hydrated and has ordered a humidifier.  No additional episodes of hemoptysis.   MEDICAL HISTORY:  Past Medical History:  Diagnosis Date   Allergy    Penicillin, Aspirin   Anemia July 2023   Blood transfusion without reported diagnosis July 2023   Cancer [facility])    Goiter    Hypothyroid    Internal hemorrhoid, bleeding 12/2022   Iron  deficiency anemia    Pre-diabetes    Recurrent cold sores    Sinus tachycardia     SURGICAL HISTORY: Past Surgical History:  Procedure Laterality Date   APPENDECTOMY     BREAST BIOPSY Right 05/21/2009   benign   BREAST BIOPSY Right 09/28/2023   US  RT BREAST BX W LOC DEV 1ST LESION IMG BX SPEC US  GUIDE 09/28/2023 [facility]   BREAST BIOPSY Right 09/28/2023   US  RT BREAST BX W LOC DEV EA ADD LESION IMG BX SPEC US  GUIDE 09/28/2023 [facility]   CHOLECYSTECTOMY N/A 02/28/2019   Procedure: LAPAROSCOPIC CHOLECYSTECTOMY;  Surgeon: Desiderio Schanz, MD;  Location: [facility];  Service: General;  Laterality: N/A;   COLONOSCOPY WITH PROPOFOL  N/A 09/14/2021   Procedure: COLONOSCOPY WITH PROPOFOL ;  Surgeon: Jinny Carmine, MD;  Location: [facility] ENDOSCOPY;  Service: Endoscopy;  Laterality: N/A;   DE QUERVAIN'S RELEASE Right 06/2022   ESOPHAGOGASTRODUODENOSCOPY (EGD) WITH PROPOFOL  N/A 02/27/2019   Procedure: ESOPHAGOGASTRODUODENOSCOPY (EGD) WITH PROPOFOL ;  Surgeon: Unk Corinn Skiff, MD;  Location: [facility] ENDOSCOPY;  Service: Gastroenterology;  Laterality: N/A;   ESOPHAGOGASTRODUODENOSCOPY (EGD) WITH PROPOFOL  N/A 09/14/2021   Procedure: ESOPHAGOGASTRODUODENOSCOPY (EGD) WITH PROPOFOL ;  Surgeon: Jinny Carmine, MD;  Location: [facility] ENDOS[facility];  Service: Endoscopy;  Laterality: N/A;   GIVENS CAPSULE STUDY N/A  09/15/2021   Procedure: GIVENS CAPSULE STUDY;  Surgeon: Jinny Carmine, MD;  Location: [facility] ENDOSCOPY;  Service: Endoscopy;  Laterality: N/A;   HEMORRHOID BANDING  10/20/2022   HEMORRHOID SURGERY N/A 12/28/2022   Procedure: HEMORRHOIDECTOMY;  Surgeon: Rodolph Romano, MD;  Location: [facility] ORS;  Service: General;  Laterality: N/A;   KNEE ARTHROSCOPY Left 01/04/2007   torn meniscus   PORTACATH PLACEMENT N/A 11/08/2023   Procedure: INSERTION, TUNNELED CENTRAL VENOUS DEVICE, WITH PORT;  Surgeon: Rodolph Romano, MD;  Location: [facility];  Service: General;  Laterality: N/A;   VITRECTOMY Right 09/2022    SOCIAL HISTORY: Social History   Socioeconomic History   Marital status: Single    Spouse name: Not on file   Number of children: 3   Years of education: Not on file   Highest education level: Bachelor's degree (e.g., BA, AB, BS)  Occupational History   Not on file  Tobacco Use   Smoking status: Never   Smokeless tobacco: Never  Vaping Use   Vaping status: Never Used  Substance and Sexual Activity   Alcohol use: Never   Drug use: Never   Sexual activity: Not Currently    Birth control/protection: Abstinence, Post-menopausal  Other Topics Concern   Not on file  Social History Narrative   Lives alone   Social Drivers of Health   Financial Resource Strain: Low Risk  (07/31/2023)   Overall Financial  Resource Strain (CARDIA)    Difficulty of Paying Living Expenses: Not hard at all  Food Insecurity: No Food Insecurity (11/22/2023)   Hunger Vital Sign    Worried About Running Out of Food in the Last Year: Never true    Ran Out of Food in the Last Year: Never true  Transportation Needs: No Transportation Needs (11/22/2023)   PRAPARE - Administrator, Civil Service (Medical): No    Lack of Transportation (Non-Medical): No  Physical Activity: Sufficiently Active (07/31/2023)   Exercise Vital Sign    Days of Exercise per Week: 4 days    Minutes of Exercise per  Session: 50 min  Stress: No Stress Concern Present (07/31/2023)   [employer] of Occupational Health - Occupational Stress Questionnaire    Feeling of Stress: Not at all  Social Connections: Moderately Integrated (11/22/2023)   Social Connection and Isolation Panel    Frequency of Communication with Friends and Family: More than three times a week    Frequency of Social Gatherings with Friends and Family: More than three times a week    Attends Religious Services: More than 4 times per year    Active Member of [employer] or Organizations: Yes    Attends Engineer, structural: More than 4 times per year    Marital Status: Divorced  Catering manager Violence: Not on file    FAMILY HISTORY: Family History  Problem Relation Age of Onset   Thyroid  disease Mother    Diabetes Mellitus II Mother    Diabetes Mother    Thyroid  disease Father    Healthy Sister    Hypothyroidism Daughter    Diabetes Son    Hypothyroidism Son    Diabetes Mellitus I Son    Kidney cancer Maternal Grandmother    Lung cancer Maternal Grandfather        smoker   Colon cancer Neg Hx    Breast cancer Neg Hx     ALLERGIES:  is allergic to aspirin and penicillin g.  MEDICATIONS:  Current Outpatient Medications  Medication Sig Dispense Refill   acetaminophen  (TYLENOL ) 500 MG tablet Take 1,000 mg by mouth every 6 (six) hours as needed (pain.).     dexamethasone  (DECADRON ) 4 MG tablet Take 2 tabs by mouth 2 times daily starting day before chemo. Then take 2 tabs daily for 2 days starting day after chemo. Take with food. 30 tablet 1   lidocaine -prilocaine  (EMLA ) cream Apply to affected area once 30 g 3   loperamide (IMODIUM) 2 MG capsule Take 1 capsule (2 mg total) by mouth See admin instructions. Initial: 4 mg,the 2 mg every 2 hours (4 mg every 4 hours at night)  maximum: 16 mg/day 60 capsule 2   Multiple Vitamin (MULTIVITAMIN WITH MINERALS) TABS tablet Take 1 tablet by mouth in the morning.     predniSONE  (STERAPRED UNI-PAK 21 TAB) 10 MG (21) TBPK tablet Day 1 take 6 tablets, Day 2 take 5 tablets, Day 3 take 4 tablets, Day 4 take 3 tablets, Day 5 take 2 tablets D6 take 1 tablet 1 each 0   Psyllium (METAMUCIL PO) Take 3 capsules by mouth in the morning and at bedtime. 2  to 3 capules     SYNTHROID  88 MCG tablet TAKE 1 TABLET BY MOUTH DAILY BEFORE BREAKFAST. 90 tablet 1   calcium-vitamin D (OSCAL WITH D) 500-5 MG-MCG tablet Take 2 tablets by mouth 2 (two) times daily. (Patient not taking: Reported on 11/27/2023)  ibuprofen  (ADVIL ) 200 MG tablet Take 400-600 mg by mouth every 8 (eight) hours as needed (pain.). (Patient not taking: Reported on 11/27/2023)     magic mouthwash (multi-ingredient) oral suspension Swish and swallow 5-10 mLs 4 (four) times daily as needed for mouth pain. (Patient not taking: Reported on 11/27/2023) 480 mL 1   magic mouthwash w/lidocaine  SOLN Take 5 mLs by mouth 4 (four) times daily as needed for mouth pain. Sig: Swish/Swallow 5-10 ml four times a day as needed. Dispense 480 ml. 1RF (Patient not taking: Reported on 11/27/2023) 480 mL 1   nystatin  (MYCOSTATIN ) 100000 UNIT/ML suspension Take 5 mLs (500,000 Units total) by mouth 4 (four) times daily. (Patient not taking: Reported on 11/27/2023) 473 mL 0   ondansetron  (ZOFRAN ) 8 MG tablet Take 1 tablet (8 mg total) by mouth every 8 (eight) hours as needed for nausea or vomiting. Start on the third day after chemotherapy. (Patient not taking: Reported on 11/27/2023) 30 tablet 1   prochlorperazine  (COMPAZINE ) 10 MG tablet Take 1 tablet (10 mg total) by mouth every 6 (six) hours as needed for nausea or vomiting. (Patient not taking: Reported on 11/27/2023) 30 tablet 1   No current facility-administered medications for this visit.    Review of Systems  Constitutional:  Negative for appetite change, chills, fatigue, fever and unexpected weight change.  HENT:   Negative for hearing loss, mouth sores, nosebleeds, sore throat, trouble  swallowing and voice change.   Eyes:  Negative for eye problems.  Respiratory:  Negative for chest tightness, cough, hemoptysis and shortness of breath.   Cardiovascular:  Negative for chest pain, leg swelling and palpitations.  Gastrointestinal:  Negative for abdominal distention, abdominal pain, blood in stool, constipation, diarrhea, nausea, rectal pain and vomiting.  Endocrine: Negative for hot flashes.  Genitourinary:  Negative for difficulty urinating, frequency, hematuria, vaginal bleeding and vaginal discharge.   Musculoskeletal:  Negative for arthralgias and myalgias.  Skin:  Positive for rash. Negative for itching and wound.  Neurological:  Negative for extremity weakness and headaches.  Hematological:  Negative for adenopathy. Does not bruise/bleed easily.  Psychiatric/Behavioral:  Negative for confusion, depression and sleep disturbance. The patient is not nervous/anxious.     PHYSICAL EXAMINATION: ECOG PERFORMANCE STATUS: 1 - Symptomatic but completely ambulatory Vitals:   11/27/23 0951  BP: 121/82  Pulse: (!) 115  Resp: 16  Temp: (!) 97.3 F (36.3 C)  SpO2: 98%   Filed Weights   11/27/23 0951  Weight: 155 lb 6.4 oz (70.5 kg)    Physical Exam Constitutional:      General: She is not in acute distress. HENT:     Head: Normocephalic and atraumatic.  Eyes:     General: No scleral icterus. Cardiovascular:     Rate and Rhythm: Normal rate.  Pulmonary:     Effort: Pulmonary effort is normal. No respiratory distress.  Abdominal:     General: There is no distension.  Musculoskeletal:        General: Normal range of motion.     Cervical back: Normal range of motion and neck supple.  Skin:    Findings: No erythema.     Comments: + Medi port, mild erythematous changes at the port site. No drainage.    Neurological:     Mental Status: She is alert and oriented to person, place, and time. Mental status is at baseline.  Psychiatric:        Mood and Affect: Mood  normal.    LABORATORY DATA:  I have reviewed the data as listed    Latest Ref Rng & Units 11/27/2023    9:23 AM 11/20/2023   10:33 AM 11/13/2023    8:15 AM  CBC  WBC 4.0 - 10.5 K/uL 17.6  2.3  8.0   Hemoglobin 12.0 - 15.0 g/dL 88.1  86.9  85.7   Hematocrit 36.0 - 46.0 % 34.5  38.0  41.2   Platelets 150 - 400 K/uL 135  86  189       Latest Ref Rng & Units 11/27/2023    9:23 AM 11/20/2023   10:33 AM 11/13/2023    8:14 AM  CMP  Glucose 70 - 99 mg/dL 843  92  882   BUN 6 - 20 mg/dL 14  17  21    Creatinine 0.44 - 1.00 mg/dL 9.26  9.41  9.24   Sodium 135 - 145 mmol/L 133  131  136   Potassium 3.5 - 5.1 mmol/L 3.9  3.9  3.9   Chloride 98 - 111 mmol/L 100  101  103   CO2 22 - 32 mmol/L 24  24  22    Calcium 8.9 - 10.3 mg/dL 8.9  8.6  9.4   Total Protein 6.5 - 8.1 g/dL 7.2  7.1  7.0   Total Bilirubin 0.0 - 1.2 mg/dL 0.5  0.6  0.6   Alkaline Phos 38 - 126 U/L 77  47  53   AST 15 - 41 U/L 55  25  23   ALT 0 - 44 U/L 82  34  25     RADIOGRAPHIC STUDIES: I have personally reviewed the radiological images as listed and agreed with the findings in the report. DG Chest Port 1 View Result Date: 11/08/2023 EXAM: 1 VIEW(S) XRAY OF THE CHEST 11/08/2023 01:57:00 PM COMPARISON: None available. CLINICAL HISTORY: Port-A-Cath in place [phone]. S/p port placement. FINDINGS: LINES, TUBES AND DEVICES: Tunneled left IJ Port-A-Cath in place with tip at lower SVC level. LUNGS AND PLEURA: No focal pulmonary opacity. No pulmonary edema. No pleural effusion. No pneumothorax. HEART AND MEDIASTINUM: No acute abnormality of the cardiac and mediastinal silhouettes. BONES AND SOFT TISSUES: No acute osseous abnormality. IMPRESSION: 1. Tunneled left IJ Port-A-Cath with tip at the lower SVC. 2. No pneumothorax identified. Electronically signed by: Waddell Calk MD 11/08/2023 02:26 PM EDT RP Workstation: [id number]   DG C-Arm 1-60 Min-No Report Result Date: 11/08/2023 Fluoroscopy was utilized by the requesting physician.   No radiographic interpretation.    ASSESSMENT & PLAN:   Cancer Staging  Invasive carcinoma of breast (HCC) Staging form: Breast, AJCC 8th Edition - Clinical stage from 10/13/2023: Stage IIA (cT2, cN0, cM0, G3, ER-, PR-, HER2+) - Signed by [name], MD on 10/24/2023  Chemotherapy follow-up-status post cycle 1 of TCHP chemotherapy on 11/14/2023 with fulphila support. labs today were reviewed with patient in detail.  Largely reassuring.  No indication for IV fluids today. Mucositis- improved but she was unable to tolerate Magic mouthwash and oral nystatin  rinse d/t side effects. Consider fluconazole if recurrent symptoms. Ok to continue baking soda and salt rinses. Encourage frequent hygiene.  Abnormal LFTs- AST is 55, ALT 82 with normal alk phos and bili. Mild elevation. Monitor.  Avoid hepatotoxic substances. Chemotherapy induced rash- grade 1 improving with oral steroids. Advised to use gentle soap twice a day. Ok to continue steroids. Sent prescription for hydrocortisone  1% cream that she can apply 1-2 times a day. Advised of risk of thinning skin and to minimize use  for shortest duration possible. Avoid sun exposure and use broad spectrum sunscreen throughout the day every day as sun exposure worsens symptoms. Do not use over the counter acne or anti-aging products when she is having symptoms. If blisters or systemic symptoms, reevaluate.  Port a cath - port appears to have flipped. Placed by Dr Rodolph. I've reached out to him and asked patient to contact his office for revision.  Nosebleed- resolved. Ok to continue hydration spray. May trial topical vaseline. Hemoptysis-thrombocytopenia postchemotherapy may have been contributing.  Resolved. Platelets improving.   Disposition:  Hold fluids today RTC as scheduled- la  Patient all questions were answered. The patient knows to call the clinic with any problems, questions or concerns.  Tinnie Dawn, DNP, AGNP-C, AOCNP [facility] [phone] (clinic)

## 2023-11-27 NOTE — Telephone Encounter (Signed)
 Pt left vm stating her pharm and a few other pharmacies can not get the hydrocortisone  cream in. Can you call in something different?

## 2023-11-28 ENCOUNTER — Encounter: Payer: Self-pay | Admitting: Oncology

## 2023-11-29 ENCOUNTER — Other Ambulatory Visit: Payer: Self-pay | Admitting: Family Medicine

## 2023-11-29 ENCOUNTER — Other Ambulatory Visit: Payer: Self-pay

## 2023-11-29 ENCOUNTER — Encounter
Admission: RE | Admit: 2023-11-29 | Discharge: 2023-11-29 | Disposition: A | Discharge status: Home / Self Care | Source: Ambulatory Visit | Attending: General Surgery | Admitting: General Surgery

## 2023-11-29 ENCOUNTER — Ambulatory Visit: Payer: Self-pay | Admitting: General Surgery

## 2023-11-29 DIAGNOSIS — R7989 Other specified abnormal findings of blood chemistry: Secondary | ICD-10-CM

## 2023-11-29 NOTE — Patient Instructions (Addendum)
 Your procedure is scheduled nw:Qmpijb 12/01/23 Report to the Registration Desk on the 1st floor of the [facility]. To find out your arrival time, please call [phone] between 1PM - 3PM on: Thursday 11/30/23  If your arrival time is 6:00 am, do not arrive before that time as the [facility] entrance doors do not open until 6:00 am.  REMEMBER: Instructions that are not followed completely may result in serious medical risk, up to and including death; or upon the discretion of your surgeon and anesthesiologist your surgery may need to be rescheduled.  Do not eat food or drink fluids after midnight the night before surgery.  No gum chewing or hard candies.   One week prior to surgery: Stop Anti-inflammatories (NSAIDS) such as Advil , Aleve, Ibuprofen , Motrin , Naproxen, Naprosyn and Aspirin based products such as Excedrin, Goody's Powder, BC Powder. Stop ANY OVER THE COUNTER supplements until after surgery.  You may however, continue to take Tylenol  if needed for pain up until the day of surgery.    Continue taking all of your other prescription medications up until the day of surgery.  ON THE DAY OF SURGERY ONLY TAKE THESE MEDICATIONS WITH SIPS OF WATER:  none   No Alcohol for 24 hours before or after surgery.  No Smoking including e-cigarettes for 24 hours before surgery.  No chewable tobacco products for at least 6 hours before surgery.  No nicotine patches on the day of surgery.  Do not use any recreational drugs for at least a week (preferably 2 weeks) before your surgery.  Please be advised that the combination of cocaine and anesthesia may have negative outcomes, up to and including death. If you test positive for cocaine, your surgery will be cancelled.  On the morning of surgery brush your teeth with toothpaste and water, you may rinse your mouth with mouthwash if you wish. Do not swallow any toothpaste or mouthwash.  Use CHG Soap or wipes as directed on  instruction sheet.  Do not wear jewelry, make-up, hairpins, clips or nail polish.  For welded (permanent) jewelry: bracelets, anklets, waist bands, etc.  Please have this removed prior to surgery.  If it is not removed, there is a chance that hospital personnel will need to cut it off on the day of surgery.  Do not wear lotions, powders, or perfumes.   Do not shave body hair from the neck down 48 hours before surgery.  Contact lenses, hearing aids and dentures may not be worn into surgery.  Do not bring valuables to the hospital. [facility] is not responsible for any missing/lost belongings or valuables.   Notify your doctor if there is any change in your medical condition (cold, fever, infection).  Wear comfortable clothing (specific to your surgery type) to the hospital.  If you are being admitted to the hospital overnight, leave your suitcase in the car. After surgery it may be brought to your room.  In case of increased patient census, it may be necessary for you, the patient, to continue your postoperative care in the Same Day Surgery department.  If you are being discharged the day of surgery, you will not be allowed to drive home. You will need a responsible individual to drive you home and stay with you for 24 hours after surgery.   If you are taking public transportation, you will need to have a responsible individual with you.  Please call the Pre-admissions Testing Dept. at [phone] if you have any questions about these  instructions.  Surgery Visitation Policy:  Patients having surgery or a procedure may have two visitors.  Children under the age of 16 must have an adult with them who is not the patient.  Inpatient Visitation:    Visiting hours are 7 a.m. to 8 p.m. Up to four visitors are allowed at one time in a patient room. The visitors may rotate out with other people during the day.  One visitor age 62 or older may stay with the patient overnight and must  be in the room by 8 p.m.   Merchandiser, retail to address health-related social needs:  [email or web address]                                                                                                            Preparing for Surgery with CHLORHEXIDINE  GLUCONATE (CHG) Soap  Chlorhexidine  Gluconate (CHG) Soap  o An antiseptic cleaner that kills germs and bonds with the skin to continue killing germs even after washing  o Used for showering the night before surgery and morning of surgery  Before surgery, you can play an important role by reducing the number of germs on your skin.  CHG (Chlorhexidine  gluconate) soap is an antiseptic cleanser which kills germs and bonds with the skin to continue killing germs even after washing.  Please do not use if you have an allergy to CHG or antibacterial soaps. If your skin becomes reddened/irritated stop using the CHG.  1. Shower the NIGHT BEFORE SURGERY with CHG soap.  2. If you choose to wash your hair, wash your hair first as usual with your normal shampoo.  3. After shampooing, rinse your hair and body thoroughly to remove the shampoo.  4. Use CHG as you would any other liquid soap. You can apply CHG directly to the skin and wash gently with a clean washcloth.  5. Apply the CHG soap to your body only from the neck down. Do not use on open wounds or open sores. Avoid contact with your eyes, ears, mouth, and genitals (private parts). Wash face and genitals (private parts) with your normal soap.  6. Wash thoroughly, paying special attention to the area where your surgery will be performed.  7. Thoroughly rinse your body with warm water.  8. Do not shower/wash with your normal soap after using and rinsing off the CHG soap.  9. Do not use lotions, oils, etc., after showering with CHG.  10. Pat yourself dry with a clean towel.  11. Wear clean pajamas to bed the night before surgery.  12. Place clean sheets on your  bed the night of your shower and do not sleep with pets.  13. Do not apply any deodorants/lotions/powders.  14. Please wear clean clothes to the hospital.  15. Remember to brush your teeth with your regular toothpaste.

## 2023-11-30 NOTE — H&P (View-Only) (Signed)
 History of Present Illness Annette Phillips is a 58 year old female who presents with a malfunctioning port-a-cath.  A port-a-cath was placed on November 08, 2023, and was successfully accessed three times for chemotherapy and fluid administration. However, during her most recent visit on Monday, the medical team was unable to access the port.  She describes that attempts to access the port resulted in the needle 'poking against' something. No pain or noticeable changes at the site of the port have been experienced.  No pain or noticeable changes at the port site.      PAST MEDICAL HISTORY:  Past Medical History:  Diagnosis Date  . Breast cancer (CMS/HHS-HCC) 10/2023   Right --- DR Babara  . Hypothyroidism         PAST SURGICAL HISTORY:   Past Surgical History:  Procedure Laterality Date  . HEMORRHOIDECTOMY INTERNAL & EXTERNAL  12/28/2022   Dr Lucas Catchings  . APPENDECTOMY    . CHOLECYSTECTOMY    . knee surgery Left          MEDICATIONS:  Outpatient Encounter Medications as of 11/30/2023  Medication Sig Dispense Refill  . acetaminophen  (TYLENOL ) 500 MG tablet Take by mouth    . dexAMETHasone  (DECADRON ) 4 MG tablet Take 4 mg by mouth 2 (two) times daily with meals    . hydrocortisone  1 % ointment Apply topically 2 (two) times daily    . levothyroxine  (SYNTHROID ) 88 MCG tablet Take by mouth once daily    . lidocaine -prilocaine  (EMLA ) cream Apply topically once    . loperamide (IMODIUM) 2 mg capsule Take 2 mg by mouth 4 (four) times daily as needed for Diarrhea    . loratadine (CLARITIN) 10 mg tablet Take 10 mg by mouth once daily    . multivitamin tablet Take 1 tablet by mouth once daily.    . ondansetron  (ZOFRAN ) 8 MG tablet Take 8 mg by mouth every 8 (eight) hours as needed for Nausea    . predniSONE (DELTASONE) 10 MG tablet Take 10 mg by mouth once daily    . prochlorperazine  (COMPAZINE ) 10 MG tablet Take 10 mg by mouth every 6 (six) hours as needed for Nausea    . psyllium  seed, with sugar, (FIBER ORAL) Take 2 each by mouth    . docusate (COLACE) 100 MG capsule Take 100 mg by mouth 2 (two) times daily as needed for Constipation (Patient not taking: Reported on 10/24/2023)    . fluticasone propionate (FLONASE) 50 mcg/actuation nasal spray Place 1 spray into both nostrils 2 (two) times daily (Patient not taking: Reported on 10/24/2023) 16 g 0  . promethazine -dextromethorphan (PROMETHAZINE -DM) 6.25-15 mg/5 mL syrup Take 5 mLs by mouth every 6 (six) hours as needed 120 mL 0  . SYNTHROID  100 mcg tablet Take 100 mcg by mouth every morning before breakfast (0630) (Patient not taking: Reported on 11/30/2023)     No facility-administered encounter medications on file as of 11/30/2023.     ALLERGIES:   Aspirin and Penicillin   SOCIAL HISTORY:  Social History   Socioeconomic History  . Marital status: Single  Tobacco Use  . Smoking status: Never  . Smokeless tobacco: Never  Vaping Use  . Vaping status: Never Used  Substance and Sexual Activity  . Alcohol use: Yes  . Drug use: No  . Sexual activity: Defer   Social Drivers of Health   Financial Resource Strain: Low Risk  (07/31/2023)   Received from [facility]   Overall Financial Resource Strain (  CARDIA)   . How hard is it for you to pay for the very basics like food, housing, medical care, and heating?: Not hard at all  Food Insecurity: No Food Insecurity (11/22/2023)   Received from [facility]   Hunger Vital Sign   . Within the past 12 months, you worried that your food would run out before you got the money to buy more.: Never true   . Within the past 12 months, the food you bought just didn't last and you didn't have money to get more.: Never true  Transportation Needs: No Transportation Needs (11/22/2023)   Received from [facility] - Transportation   . In the past 12 months, has lack of transportation kept you from medical appointments or from getting medications?: No   . In the past 12 months,  has lack of transportation kept you from meetings, work, or from getting things needed for daily living?: No    FAMILY HISTORY:  Family History  Problem Relation Name Age of Onset  . No Known Problems Mother       GENERAL REVIEW OF SYSTEMS:   General ROS: negative for - chills, fatigue, fever, weight gain or weight loss Allergy and Immunology ROS: negative for - hives  Hematological and Lymphatic ROS: negative for - bleeding problems or bruising, negative for palpable nodes Endocrine ROS: negative for - heat or cold intolerance, hair changes Respiratory ROS: negative for - cough, shortness of breath or wheezing Cardiovascular ROS: no chest pain or palpitations GI ROS: negative for nausea, vomiting, abdominal pain, diarrhea, constipation Musculoskeletal ROS: negative for - joint swelling or muscle pain Neurological ROS: negative for - confusion, syncope Dermatological ROS: negative for pruritus and rash  PHYSICAL EXAM:  Vitals:   11/30/23 1534  BP: 139/89  Pulse: (!) 111  .  Ht:170.2 cm (5' 7) Wt:69.4 kg (153 lb) ADJ:Anib surface area is 1.81 meters squared. Body mass index is 23.96 kg/m.SABRA   GENERAL: Alert, active, oriented x3  HEENT: Pupils equal reactive to light. Extraocular movements are intact. Sclera clear. Palpebral conjunctiva normal red color.Pharynx clear.  NECK: Supple with no palpable mass and no adenopathy.  LUNGS: Sound clear with no rales rhonchi or wheezes.  HEART: Regular rhythm S1 and S2 without murmur.  CHEST: Left-sided Port-A-Cath palpable, unable to completely fill the forms from the anterior access point of the Chemo-Port  EXTREMITIES: Well-developed well-nourished symmetrical with no dependent edema.  NEUROLOGICAL: Awake alert oriented, facial expression symmetrical, moving all extremities.  Assessment & Plan Malfunctioning port-a-cath   The port-a-cath, placed on November 08, 2023, is malfunctioning and cannot be accessed. It was previously  accessed successfully three times for chemotherapy and fluid administration. The issue may be due to the port being flipped or twisted, as it feels smooth and mobile rather than fixed. Imaging modalities like MRI or CT cannot confirm the orientation of the port. Schedule a procedure in the operating room for December 01, 2023, to assess and potentially correct the port-a-cath. Administer sedation and local anesthesia during the procedure. If the port is flipped, attempt to reposition it and secure it with additional stitches. If the port is not flipped, attempt to flush it with a syringe to ensure it is functioning properly.    Port-A-Cath in place [Z95.828]          Patient verbalized understanding, all questions were answered, and were agreeable with the plan outlined above.   [name], MD  Electronically signed by [name],  MD

## 2023-11-30 NOTE — Progress Notes (Signed)
 History of Present Illness Annette Phillips is a 58 year old female who presents with a malfunctioning port-a-cath.  A port-a-cath was placed on November 08, 2023, and was successfully accessed three times for chemotherapy and fluid administration. However, during her most recent visit on Monday, the medical team was unable to access the port.  She describes that attempts to access the port resulted in the needle 'poking against' something. No pain or noticeable changes at the site of the port have been experienced.  No pain or noticeable changes at the port site.      PAST MEDICAL HISTORY:  Past Medical History:  Diagnosis Date  . Breast cancer (CMS/HHS-HCC) 10/2023   Right --- DR Babara  . Hypothyroidism         PAST SURGICAL HISTORY:   Past Surgical History:  Procedure Laterality Date  . HEMORRHOIDECTOMY INTERNAL & EXTERNAL  12/28/2022   Dr Lucas Catchings  . APPENDECTOMY    . CHOLECYSTECTOMY    . knee surgery Left          MEDICATIONS:  Outpatient Encounter Medications as of 11/30/2023  Medication Sig Dispense Refill  . acetaminophen  (TYLENOL ) 500 MG tablet Take by mouth    . dexAMETHasone  (DECADRON ) 4 MG tablet Take 4 mg by mouth 2 (two) times daily with meals    . hydrocortisone  1 % ointment Apply topically 2 (two) times daily    . levothyroxine  (SYNTHROID ) 88 MCG tablet Take by mouth once daily    . lidocaine -prilocaine  (EMLA ) cream Apply topically once    . loperamide (IMODIUM) 2 mg capsule Take 2 mg by mouth 4 (four) times daily as needed for Diarrhea    . loratadine (CLARITIN) 10 mg tablet Take 10 mg by mouth once daily    . multivitamin tablet Take 1 tablet by mouth once daily.    . ondansetron  (ZOFRAN ) 8 MG tablet Take 8 mg by mouth every 8 (eight) hours as needed for Nausea    . predniSONE (DELTASONE) 10 MG tablet Take 10 mg by mouth once daily    . prochlorperazine  (COMPAZINE ) 10 MG tablet Take 10 mg by mouth every 6 (six) hours as needed for Nausea    . psyllium  seed, with sugar, (FIBER ORAL) Take 2 each by mouth    . docusate (COLACE) 100 MG capsule Take 100 mg by mouth 2 (two) times daily as needed for Constipation (Patient not taking: Reported on 10/24/2023)    . fluticasone propionate (FLONASE) 50 mcg/actuation nasal spray Place 1 spray into both nostrils 2 (two) times daily (Patient not taking: Reported on 10/24/2023) 16 g 0  . promethazine -dextromethorphan (PROMETHAZINE -DM) 6.25-15 mg/5 mL syrup Take 5 mLs by mouth every 6 (six) hours as needed 120 mL 0  . SYNTHROID  100 mcg tablet Take 100 mcg by mouth every morning before breakfast (0630) (Patient not taking: Reported on 11/30/2023)     No facility-administered encounter medications on file as of 11/30/2023.     ALLERGIES:   Aspirin and Penicillin   SOCIAL HISTORY:  Social History   Socioeconomic History  . Marital status: Single  Tobacco Use  . Smoking status: Never  . Smokeless tobacco: Never  Vaping Use  . Vaping status: Never Used  Substance and Sexual Activity  . Alcohol use: Yes  . Drug use: No  . Sexual activity: Defer   Social Drivers of Health   Financial Resource Strain: Low Risk  (07/31/2023)   Received from [facility]   Overall Financial Resource Strain (  CARDIA)   . How hard is it for you to pay for the very basics like food, housing, medical care, and heating?: Not hard at all  Food Insecurity: No Food Insecurity (11/22/2023)   Received from [facility]   Hunger Vital Sign   . Within the past 12 months, you worried that your food would run out before you got the money to buy more.: Never true   . Within the past 12 months, the food you bought just didn't last and you didn't have money to get more.: Never true  Transportation Needs: No Transportation Needs (11/22/2023)   Received from [facility] - Transportation   . In the past 12 months, has lack of transportation kept you from medical appointments or from getting medications?: No   . In the past 12 months,  has lack of transportation kept you from meetings, work, or from getting things needed for daily living?: No    FAMILY HISTORY:  Family History  Problem Relation Name Age of Onset  . No Known Problems Mother       GENERAL REVIEW OF SYSTEMS:   General ROS: negative for - chills, fatigue, fever, weight gain or weight loss Allergy and Immunology ROS: negative for - hives  Hematological and Lymphatic ROS: negative for - bleeding problems or bruising, negative for palpable nodes Endocrine ROS: negative for - heat or cold intolerance, hair changes Respiratory ROS: negative for - cough, shortness of breath or wheezing Cardiovascular ROS: no chest pain or palpitations GI ROS: negative for nausea, vomiting, abdominal pain, diarrhea, constipation Musculoskeletal ROS: negative for - joint swelling or muscle pain Neurological ROS: negative for - confusion, syncope Dermatological ROS: negative for pruritus and rash  PHYSICAL EXAM:  Vitals:   11/30/23 1534  BP: 139/89  Pulse: (!) 111  .  Ht:170.2 cm (5' 7) Wt:69.4 kg (153 lb) ADJ:Anib surface area is 1.81 meters squared. Body mass index is 23.96 kg/m.SABRA   GENERAL: Alert, active, oriented x3  HEENT: Pupils equal reactive to light. Extraocular movements are intact. Sclera clear. Palpebral conjunctiva normal red color.Pharynx clear.  NECK: Supple with no palpable mass and no adenopathy.  LUNGS: Sound clear with no rales rhonchi or wheezes.  HEART: Regular rhythm S1 and S2 without murmur.  CHEST: Left-sided Port-A-Cath palpable, unable to completely fill the forms from the anterior access point of the Chemo-Port  EXTREMITIES: Well-developed well-nourished symmetrical with no dependent edema.  NEUROLOGICAL: Awake alert oriented, facial expression symmetrical, moving all extremities.  Assessment & Plan Malfunctioning port-a-cath   The port-a-cath, placed on November 08, 2023, is malfunctioning and cannot be accessed. It was previously  accessed successfully three times for chemotherapy and fluid administration. The issue may be due to the port being flipped or twisted, as it feels smooth and mobile rather than fixed. Imaging modalities like MRI or CT cannot confirm the orientation of the port. Schedule a procedure in the operating room for December 01, 2023, to assess and potentially correct the port-a-cath. Administer sedation and local anesthesia during the procedure. If the port is flipped, attempt to reposition it and secure it with additional stitches. If the port is not flipped, attempt to flush it with a syringe to ensure it is functioning properly.    Port-A-Cath in place [Z95.828]          Patient verbalized understanding, all questions were answered, and were agreeable with the plan outlined above.   [name], MD  Electronically signed by [name],  MD

## 2023-12-01 ENCOUNTER — Ambulatory Visit: Admitting: Registered Nurse

## 2023-12-01 ENCOUNTER — Encounter
Admission: RE | Disposition: A | Discharge status: Home / Self Care | Payer: Self-pay | Source: Home / Self Care | Attending: General Surgery

## 2023-12-01 ENCOUNTER — Ambulatory Visit
Admission: RE | Admit: 2023-12-01 | Discharge: 2023-12-01 | Disposition: A | Discharge status: Home / Self Care | Attending: General Surgery | Admitting: General Surgery

## 2023-12-01 ENCOUNTER — Encounter: Payer: Self-pay | Admitting: General Surgery

## 2023-12-01 ENCOUNTER — Other Ambulatory Visit: Payer: Self-pay

## 2023-12-01 DIAGNOSIS — T85618A Breakdown (mechanical) of other specified internal prosthetic devices, implants and grafts, initial encounter: Secondary | ICD-10-CM | POA: Diagnosis present

## 2023-12-01 DIAGNOSIS — E039 Hypothyroidism, unspecified: Secondary | ICD-10-CM | POA: Insufficient documentation

## 2023-12-01 DIAGNOSIS — Y713 Surgical instruments, materials and cardiovascular devices (including sutures) associated with adverse incidents: Secondary | ICD-10-CM | POA: Diagnosis not present

## 2023-12-01 DIAGNOSIS — R7303 Prediabetes: Secondary | ICD-10-CM | POA: Insufficient documentation

## 2023-12-01 HISTORY — PX: PORT A CATH REVISION: SHX6033

## 2023-12-01 SURGERY — PORT A CATH REVISION
Anesthesia: General | Site: Chest | Laterality: Left

## 2023-12-01 MED ORDER — ONDANSETRON HCL 4 MG/2ML IJ SOLN
INTRAMUSCULAR | Status: DC | PRN
  Administered 2023-12-01: 4 mg via INTRAVENOUS

## 2023-12-01 MED ORDER — DIPHENHYDRAMINE HCL 50 MG/ML IJ SOLN
INTRAMUSCULAR | Status: DC | PRN
  Administered 2023-12-01: 12.5 mg via INTRAVENOUS

## 2023-12-01 MED ORDER — MIDAZOLAM HCL (PF) 2 MG/2ML IJ SOLN
INTRAMUSCULAR | Status: DC | PRN
  Administered 2023-12-01: 2 mg via INTRAVENOUS

## 2023-12-01 MED ORDER — ORAL CARE MOUTH RINSE: 15.0000 mL | Freq: Once | OROMUCOSAL | Status: AC

## 2023-12-01 MED ORDER — SODIUM CHLORIDE 0.9 % IV SOLN
INTRAVENOUS | Status: DC | PRN
  Administered 2023-12-01: 17 mL via INTRAMUSCULAR

## 2023-12-01 MED ORDER — DIPHENHYDRAMINE HCL 50 MG/ML IJ SOLN
INTRAMUSCULAR | Status: AC
  Filled 2023-12-01: qty 1

## 2023-12-01 MED ORDER — SODIUM CHLORIDE (PF) 0.9 % IJ SOLN
INTRAMUSCULAR | Status: AC
  Filled 2023-12-01: qty 50

## 2023-12-01 MED ORDER — FENTANYL CITRATE (PF) 100 MCG/2ML IJ SOLN
INTRAMUSCULAR | Status: DC | PRN
  Administered 2023-12-01: 50 ug via INTRAVENOUS

## 2023-12-01 MED ORDER — PROPOFOL 500 MG/50ML IV EMUL
INTRAVENOUS | Status: DC | PRN
  Administered 2023-12-01: 20 mg via INTRAVENOUS
  Administered 2023-12-01: 30 mg via INTRAVENOUS
  Administered 2023-12-01: 20 mg via INTRAVENOUS
  Administered 2023-12-01: 50 mg via INTRAVENOUS
  Administered 2023-12-01: 100 ug/kg/min via INTRAVENOUS
  Administered 2023-12-01: 50 ug/kg/min via INTRAVENOUS

## 2023-12-01 MED ORDER — CHLORHEXIDINE GLUCONATE 0.12 % MT SOLN
15.0000 mL | Freq: Once | OROMUCOSAL | Status: AC
  Administered 2023-12-01: 15 mL via OROMUCOSAL

## 2023-12-01 MED ORDER — BUPIVACAINE-EPINEPHRINE (PF) 0.25% -1:200000 IJ SOLN
INTRAMUSCULAR | Status: AC
  Filled 2023-12-01: qty 30

## 2023-12-01 MED ORDER — PROPOFOL 1000 MG/100ML IV EMUL
INTRAVENOUS | Status: AC
  Filled 2023-12-01: qty 100

## 2023-12-01 MED ORDER — CEFAZOLIN SODIUM-DEXTROSE 2-4 GM/100ML-% IV SOLN
INTRAVENOUS | Status: AC
  Filled 2023-12-01: qty 100

## 2023-12-01 MED ORDER — FENTANYL CITRATE (PF) 100 MCG/2ML IJ SOLN
INTRAMUSCULAR | Status: AC
  Filled 2023-12-01: qty 2

## 2023-12-01 MED ORDER — CHLORHEXIDINE GLUCONATE 0.12 % MT SOLN
OROMUCOSAL | Status: AC
  Filled 2023-12-01: qty 15

## 2023-12-01 MED ORDER — ACETAMINOPHEN 10 MG/ML IV SOLN
INTRAVENOUS | Status: AC
  Filled 2023-12-01: qty 100

## 2023-12-01 MED ORDER — OXYCODONE HCL 5 MG/5ML PO SOLN: 5.0000 mg | Freq: Once | ORAL | Status: DC | PRN

## 2023-12-01 MED ORDER — PROPOFOL 10 MG/ML IV BOLUS
INTRAVENOUS | Status: AC
  Filled 2023-12-01: qty 20

## 2023-12-01 MED ORDER — OXYCODONE HCL 5 MG PO TABS: 5.0000 mg | ORAL_TABLET | Freq: Once | ORAL | Status: DC | PRN

## 2023-12-01 MED ORDER — LACTATED RINGERS IV SOLN: INTRAVENOUS | Status: DC

## 2023-12-01 MED ORDER — SODIUM CHLORIDE (PF) 0.9 % IJ SOLN
INTRAMUSCULAR | Status: DC | PRN
  Administered 2023-12-01: 50 mL

## 2023-12-01 MED ORDER — ACETAMINOPHEN 10 MG/ML IV SOLN
INTRAVENOUS | Status: DC | PRN
  Administered 2023-12-01: 1000 mg via INTRAVENOUS

## 2023-12-01 MED ORDER — LIDOCAINE HCL (CARDIAC) PF 100 MG/5ML IV SOSY
PREFILLED_SYRINGE | INTRAVENOUS | Status: DC | PRN
  Administered 2023-12-01: 60 mg via INTRAVENOUS

## 2023-12-01 MED ORDER — FENTANYL CITRATE (PF) 100 MCG/2ML IJ SOLN: 25.0000 ug | INTRAMUSCULAR | Status: DC | PRN

## 2023-12-01 MED ORDER — MIDAZOLAM HCL 2 MG/2ML IJ SOLN
INTRAMUSCULAR | Status: AC
  Filled 2023-12-01: qty 2

## 2023-12-01 MED ORDER — HEPARIN SODIUM (PORCINE) 5000 UNIT/ML IJ SOLN
INTRAMUSCULAR | Status: AC
  Filled 2023-12-01: qty 1

## 2023-12-01 MED ORDER — ONDANSETRON HCL 4 MG/2ML IJ SOLN
INTRAMUSCULAR | Status: AC
  Filled 2023-12-01: qty 2

## 2023-12-01 MED ADMIN — Bupivacaine Inj 0.25% w/ Epinephrine 1:200000 (PF): 10 mL | PERINEURAL | NDC 63323046802

## 2023-12-01 MED ADMIN — Dexamethasone Sod Phosphate Preservative Free Inj 10 MG/ML: 10 mg | INTRAVENOUS | NDC 00641036721

## 2023-12-01 MED ADMIN — Cefazolin Sodium-Dextrose IV Solution 2 GM/100ML-4%: 2 g | INTRAVENOUS | NDC 00338350841

## 2023-12-01 SURGICAL SUPPLY — 27 items
BAG DECANTER FOR FLEXI CONT (MISCELLANEOUS) ×1 IMPLANT
BLADE SURG 15 STRL LF DISP TIS (BLADE) ×1 IMPLANT
BLADE SURG SZ11 CARB STEEL (BLADE) ×1 IMPLANT
CLAMP SUTURE YELLOW 5 PAIRS (MISCELLANEOUS) ×1 IMPLANT
DERMABOND ADVANCED .7 DNX12 (GAUZE/BANDAGES/DRESSINGS) ×1 IMPLANT
DRAPE C-ARM XRAY 36X54 (DRAPES) ×1 IMPLANT
DRAPE LAPAROTOMY 77X122 PED (DRAPES) IMPLANT
ELECTRODE REM PT RTRN 9FT ADLT (ELECTROSURGICAL) ×1 IMPLANT
GLOVE BIO SURGEON STRL SZ 6.5 (GLOVE) ×1 IMPLANT
GLOVE BIOGEL PI IND STRL 6.5 (GLOVE) ×1 IMPLANT
GLOVE SURG SYN 6.5 PF PI (GLOVE) ×2 IMPLANT
GOWN STRL REUS W/ TWL LRG LVL3 (GOWN DISPOSABLE) ×3 IMPLANT
IV 0.9% NACL 500 ML (IV SOLUTION) ×1 IMPLANT
KIT TURNOVER KIT A (KITS) ×1 IMPLANT
LABEL OR SOLS (LABEL) ×1 IMPLANT
MANIFOLD NEPTUNE II (INSTRUMENTS) ×1 IMPLANT
NDL FILTER BLUNT 18X1 1/2 (NEEDLE) ×1 IMPLANT
NEEDLE FILTER BLUNT 18X1 1/2 (NEEDLE) ×1 IMPLANT
PACK PORT-A-CATH (MISCELLANEOUS) ×1 IMPLANT
SET PORT ACCESS POWERLOC (SET/KITS/TRAYS/PACK) IMPLANT
SUT MNCRL AB 4-0 PS2 18 (SUTURE) ×1 IMPLANT
SUT PROLENE 2 0 FS (SUTURE) ×1 IMPLANT
SUT VIC AB 3-0 SH 27X BRD (SUTURE) ×1 IMPLANT
SYR 10ML LL (SYRINGE) ×2 IMPLANT
SYR 3ML LL SCALE MARK (SYRINGE) ×1 IMPLANT
TRAP FLUID SMOKE EVACUATOR (MISCELLANEOUS) ×1 IMPLANT
WATER STERILE IRR 500ML POUR (IV SOLUTION) ×1 IMPLANT

## 2023-12-01 NOTE — Op Note (Signed)
 OPERATION REPORT  Pre Operative Diagnosis: Malfunctioning Port-A-Cath  Post operative diagnosis: Malfunctioning Port-A-Cath  Anesthesia: MAC and Local   Surgeon: Dr. Rodolph   Indication: This 58 y.o. year old female with malfunctioning Port-A-Cath, suspected twisting of the port.   Findings - Upon entering the pocket for the port, the pocket was identified in the correct position as seen on the picture below.    Description of procedure: after orienting patient about the procedure steps and benefits and patient agreed to proceed. Time out was done identifying correct patient and location of procedure. After induction of monitored sedation, local anesthesia was infiltrated around the previous incision. With a blade #15, an incision was done over the previous incision. Sharp dissection was carried down and Port-A-Cath was found in the correct position, and a flip.  Initially had a bit of resistance when aspirated removing a clot but then I was able to aspirate and flush the catheter without any resistance.  I flushed the catheter with 15 cc of heparin  mixed with saline.  Deep dermal stitches were done with vicryl 4-0 and skin closed with Monocryl 4-0 in subcuticular fashion.    Complications: none   EBL: minimal  Lucas Rodolph, MD, FACS

## 2023-12-01 NOTE — Transfer of Care (Signed)
 Immediate Anesthesia Transfer of Care Note  Patient: Annette Phillips  Procedure(s) Performed: REVISION, PORT-A-CATH INSERTION (Left: Chest)  Patient Location: PACU  Anesthesia Type:General  Level of Consciousness: awake and patient cooperative  Airway & Oxygen Therapy: Patient Spontanous Breathing  Post-op Assessment: Report given to RN and Post -op Vital signs reviewed and stable  Post vital signs: Reviewed and stable  Last Vitals:  Vitals Value Taken Time  BP 162/87 12/01/23 08:10  Temp 36.2 C 12/01/23 08:10  Pulse 106 12/01/23 08:10  Resp 17 12/01/23 08:10  SpO2 100 % 12/01/23 08:10  Vitals shown include unfiled device data.  Last Pain:  Vitals:   12/01/23 0643  TempSrc: Temporal  PainSc: 0-No pain         Complications: No notable events documented.

## 2023-12-01 NOTE — Interval H&P Note (Signed)
 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  The various methods of treatment have been discussed with the patient and family. After consideration of risks, benefits and other options for treatment, the patient has consented to  Procedure(s): REVISION, PORT-A-CATH INSERTION (N/A) as a surgical intervention.  The patient's history has been reviewed, patient examined, no change in status, stable for surgery.  I have reviewed the patient's chart and labs.  Questions were answered to the patient's satisfaction.     [name]

## 2023-12-01 NOTE — Anesthesia Postprocedure Evaluation (Signed)
 Anesthesia Post Note  Patient: Annette Phillips  Procedure(s) Performed: REVISION, PORT-A-CATH INSERTION (Left: Chest)  Patient location during evaluation: PACU Anesthesia Type: General Level of consciousness: awake and alert Pain management: pain level controlled Vital Signs Assessment: post-procedure vital signs reviewed and stable Respiratory status: spontaneous breathing, nonlabored ventilation, respiratory function stable and patient connected to nasal cannula oxygen Cardiovascular status: blood pressure returned to baseline and stable Postop Assessment: no apparent nausea or vomiting Anesthetic complications: no   No notable events documented.   Last Vitals:  Vitals:   12/01/23 0819 12/01/23 0832  BP: (!) 163/96 (!) 144/84  Pulse: 89 76  Resp: 18 19  Temp:  (!) 36.1 C  SpO2: 98% 100%    Last Pain:  Vitals:   12/01/23 0832  TempSrc:   PainSc: 0-No pain                 Lendia LITTIE Mae

## 2023-12-01 NOTE — Anesthesia Preprocedure Evaluation (Signed)
 Anesthesia Evaluation  Patient identified by MRN, date of birth, ID band Patient awake    Reviewed: Allergy & Precautions, NPO status , Patient's Chart, lab work & pertinent test results  History of Anesthesia Complications Negative for: history of anesthetic complications  Airway Mallampati: II  TM Distance: >3 FB Neck ROM: full    Dental  (+) Chipped, Dental Advidsory Given   Pulmonary neg pulmonary ROS   Pulmonary exam normal        Cardiovascular negative cardio ROS Normal cardiovascular exam     Neuro/Psych negative neurological ROS  negative psych ROS   GI/Hepatic negative GI ROS, Neg liver ROS,,,  Endo/Other  diabetes (borderline)Hypothyroidism    Renal/GU negative Renal ROS     Musculoskeletal   Abdominal   Peds  Hematology negative hematology ROS (+)   Anesthesia Other Findings Past Medical History: No date: Allergy     Comment:  Penicillin, Aspirin July 2023: Anemia July 2023: Blood transfusion without reported diagnosis No date: Goiter No date: Hypothyroid 12/2022: Internal hemorrhoid, bleeding No date: Iron  deficiency anemia No date: Pre-diabetes 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 No date: Sinus tachycardia  Past Surgical History: No date: APPENDECTOMY 05/21/2009: BREAST BIOPSY; Right     Comment:  benign 02/28/2019: CHOLECYSTECTOMY; N/A     Comment:  Procedure: LAPAROSCOPIC CHOLECYSTECTOMY;  Surgeon:               Desiderio Schanz, MD;  Location: [facility] ORS;  Service:               General;  Laterality: N/A; 09/14/2021: COLONOSCOPY WITH PROPOFOL ; N/A     Comment:  Procedure: COLONOSCOPY WITH PROPOFOL ;  Surgeon: Jinny Carmine, MD;  Location: [facility] ENDOSCOPY;  Service:               Endoscopy;  Laterality: N/A; 06/2022: DE QUERVAIN'S RELEASE; Right 02/27/2019: ESOPHAGOGASTRODUODENOSCOPY (EGD) WITH PROPOFOL ; N/A     Comment:  Procedure: ESOPHAGOGASTRODUODENOSCOPY (EGD) WITH                PROPOFOL ;  Surgeon: Unk Corinn Skiff, MD;  Location:               [facility] ENDOSCOPY;  Service: Gastroenterology;  Laterality:               N/A; 09/14/2021: ESOPHAGOGASTRODUODENOSCOPY (EGD) WITH PROPOFOL ; N/A     Comment:  Procedure: ESOPHAGOGASTRODUODENOSCOPY (EGD) WITH               PROPOFOL ;  Surgeon: Jinny Carmine, MD;  Location: [facility]               ENDOSCOPY;  Service: Endoscopy;  Laterality: N/A; 09/15/2021: GIVENS CAPSULE STUDY; N/A     Comment:  Procedure: GIVENS CAPSULE STUDY;  Surgeon: Jinny Carmine,              MD;  Location: [facility] ENDOSCOPY;  Service: Endoscopy;                Laterality: N/A; 10/20/2022: HEMORRHOID BANDING 01/04/2007: KNEE ARTHROSCOPY; Left     Comment:  torn meniscus 09/2022: VITRECTOMY; Right  BMI    Body Mass Index: 24.28 kg/m      Reproductive/Obstetrics negative OB ROS                              Anesthesia Physical Anesthesia Plan  ASA: 2  Anesthesia  Plan: General   Post-op Pain Management:    Induction: Intravenous  PONV Risk Score and Plan: 3 and Propofol  infusion and TIVA  Airway Management Planned: Natural Airway and Simple Face Mask  Additional Equipment:   Intra-op Plan:   Post-operative Plan:   Informed Consent: I have reviewed the patients History and Physical, chart, labs and discussed the procedure including the risks, benefits and alternatives for the proposed anesthesia with the patient or authorized representative who has indicated his/her understanding and acceptance.     Dental Advisory Given  Plan Discussed with: Anesthesiologist, CRNA and Surgeon  Anesthesia Plan Comments: (Patient consented for risks of anesthesia including but not limited to:  - adverse reactions to medications - damage to eyes, teeth, lips or other oral mucosa - nerve damage due to positioning  - sore throat or hoarseness - Damage to heart, brain, nerves, lungs, other parts of body or loss of life  Patient  voiced understanding and assent.)        Anesthesia Quick Evaluation

## 2023-12-01 NOTE — Discharge Instructions (Signed)
  Diet: Resume home heart healthy regular diet.   Activity: Increase activity as tolerated.   Wound care: May shower with soapy water and pat dry (do not rub incisions), but no baths or submerging incision underwater until follow-up. (no swimming)   Medications: Resume all home medications. For mild to moderate pain: acetaminophen  (Tylenol ) or ibuprofen  (if no kidney disease).   Call office [phone]) at any time if any questions, worsening pain, fevers/chills, bleeding, drainage from incision site, or other concerns.

## 2023-12-01 NOTE — Anesthesia Procedure Notes (Signed)
 Procedure Name: MAC Date/Time: 12/01/2023 7:31 AM  Performed by: Lorrene Camelia LABOR, CRNAPre-anesthesia Checklist: Patient identified, Emergency Drugs available, Patient being monitored and Suction available Patient Re-evaluated:Patient Re-evaluated prior to induction Oxygen Delivery Method: Simple face mask Preoxygenation: Pre-oxygenation with 100% oxygen

## 2023-12-02 ENCOUNTER — Encounter: Payer: Self-pay | Admitting: General Surgery

## 2023-12-05 ENCOUNTER — Encounter: Payer: Self-pay | Admitting: Oncology

## 2023-12-05 ENCOUNTER — Inpatient Hospital Stay

## 2023-12-05 ENCOUNTER — Inpatient Hospital Stay: Admitting: Oncology

## 2023-12-05 VITALS — BP 137/96 | HR 100 | Temp 97.0°F | Resp 19

## 2023-12-05 VITALS — BP 147/92 | HR 110 | Temp 97.7°F | Resp 18 | Ht 67.0 in | Wt 155.0 lb

## 2023-12-05 DIAGNOSIS — T451X5A Adverse effect of antineoplastic and immunosuppressive drugs, initial encounter: Secondary | ICD-10-CM

## 2023-12-05 DIAGNOSIS — C50919 Malignant neoplasm of unspecified site of unspecified female breast: Secondary | ICD-10-CM

## 2023-12-05 DIAGNOSIS — Z5111 Encounter for antineoplastic chemotherapy: Secondary | ICD-10-CM | POA: Diagnosis not present

## 2023-12-05 DIAGNOSIS — R000 Tachycardia, unspecified: Secondary | ICD-10-CM

## 2023-12-05 DIAGNOSIS — K1230 Oral mucositis (ulcerative), unspecified: Secondary | ICD-10-CM

## 2023-12-05 DIAGNOSIS — Z171 Estrogen receptor negative status [ER-]: Secondary | ICD-10-CM | POA: Diagnosis not present

## 2023-12-05 DIAGNOSIS — Z1731 Human epidermal growth factor receptor 2 positive status: Secondary | ICD-10-CM

## 2023-12-05 DIAGNOSIS — K521 Toxic gastroenteritis and colitis: Secondary | ICD-10-CM

## 2023-12-05 DIAGNOSIS — C50211 Malignant neoplasm of upper-inner quadrant of right female breast: Secondary | ICD-10-CM

## 2023-12-05 DIAGNOSIS — Z1722 Progesterone receptor negative status: Secondary | ICD-10-CM | POA: Diagnosis not present

## 2023-12-05 DIAGNOSIS — R21 Rash and other nonspecific skin eruption: Secondary | ICD-10-CM | POA: Insufficient documentation

## 2023-12-05 DIAGNOSIS — R7303 Prediabetes: Secondary | ICD-10-CM | POA: Insufficient documentation

## 2023-12-05 LAB — CBC WITH DIFFERENTIAL (CANCER CENTER ONLY)
Abs Immature Granulocytes: 0.14 K/uL — ABNORMAL HIGH (ref 0.00–0.07)
Basophils Absolute: 0 K/uL (ref 0.0–0.1)
Basophils Relative: 0 %
Eosinophils Absolute: 0 K/uL (ref 0.0–0.5)
Eosinophils Relative: 0 %
HCT: 35.2 % — ABNORMAL LOW (ref 36.0–46.0)
Hemoglobin: 12 g/dL (ref 12.0–15.0)
Immature Granulocytes: 2 %
Lymphocytes Relative: 8 %
Lymphs Abs: 0.7 K/uL (ref 0.7–4.0)
MCH: 30.8 pg (ref 26.0–34.0)
MCHC: 34.1 g/dL (ref 30.0–36.0)
MCV: 90.5 fL (ref 80.0–100.0)
Monocytes Absolute: 0.1 K/uL (ref 0.1–1.0)
Monocytes Relative: 1 %
Neutro Abs: 6.9 K/uL (ref 1.7–7.7)
Neutrophils Relative %: 89 %
Platelet Count: 238 K/uL (ref 150–400)
RBC: 3.89 MIL/uL (ref 3.87–5.11)
RDW: 14.2 % (ref 11.5–15.5)
WBC Count: 7.8 K/uL (ref 4.0–10.5)
nRBC: 0 % (ref 0.0–0.2)

## 2023-12-05 LAB — CMP (CANCER CENTER ONLY)
ALT: 38 U/L (ref 0–44)
AST: 33 U/L (ref 15–41)
Albumin: 3.5 g/dL (ref 3.5–5.0)
Alkaline Phosphatase: 50 U/L (ref 38–126)
Anion gap: 7 (ref 5–15)
BUN: 24 mg/dL — ABNORMAL HIGH (ref 6–20)
CO2: 22 mmol/L (ref 22–32)
Calcium: 9 mg/dL (ref 8.9–10.3)
Chloride: 103 mmol/L (ref 98–111)
Creatinine: 0.73 mg/dL (ref 0.44–1.00)
GFR, Estimated: >60 mL/min (ref >60)
Glucose, Bld: 240 mg/dL — ABNORMAL HIGH (ref 70–99)
Potassium: 3.8 mmol/L (ref 3.5–5.1)
Sodium: 132 mmol/L — ABNORMAL LOW (ref 135–145)
Total Bilirubin: 0.5 mg/dL (ref 0.0–1.2)
Total Protein: 7.1 g/dL (ref 6.5–8.1)

## 2023-12-05 MED ORDER — ACETAMINOPHEN 325 MG PO TABS
650.0000 mg | ORAL_TABLET | Freq: Once | ORAL | Status: AC
  Administered 2023-12-05: 650 mg via ORAL
  Filled 2023-12-05: qty 2

## 2023-12-05 MED ORDER — DIPHENHYDRAMINE HCL 25 MG PO CAPS
50.0000 mg | ORAL_CAPSULE | Freq: Once | ORAL | Status: AC
  Administered 2023-12-05: 50 mg via ORAL
  Filled 2023-12-05: qty 2

## 2023-12-05 MED ORDER — TRASTUZUMAB-ANNS CHEMO 150 MG IV SOLR
6.0000 mg/kg | Freq: Once | INTRAVENOUS | Status: AC
  Administered 2023-12-05: 420 mg via INTRAVENOUS
  Filled 2023-12-05: qty 20

## 2023-12-05 MED ORDER — PALONOSETRON HCL INJECTION 0.25 MG/5ML
0.2500 mg | Freq: Once | INTRAVENOUS | Status: AC
  Administered 2023-12-05: 0.25 mg via INTRAVENOUS
  Filled 2023-12-05: qty 5

## 2023-12-05 MED ORDER — SODIUM CHLORIDE 0.9 % IV SOLN
672.0000 mg | Freq: Once | INTRAVENOUS | Status: AC
  Administered 2023-12-05: 670 mg via INTRAVENOUS
  Filled 2023-12-05: qty 67

## 2023-12-05 MED ORDER — APREPITANT 130 MG/18ML IV EMUL
130.0000 mg | Freq: Once | INTRAVENOUS | Status: AC
  Administered 2023-12-05: 130 mg via INTRAVENOUS
  Filled 2023-12-05: qty 18

## 2023-12-05 MED ORDER — SODIUM CHLORIDE 0.9 % IV SOLN
420.0000 mg | Freq: Once | INTRAVENOUS | Status: AC
  Administered 2023-12-05: 420 mg via INTRAVENOUS
  Filled 2023-12-05: qty 14

## 2023-12-05 MED ORDER — SODIUM CHLORIDE 0.9 % IV SOLN
75.0000 mg/m2 | Freq: Once | INTRAVENOUS | Status: AC
  Administered 2023-12-05: 139 mg via INTRAVENOUS
  Filled 2023-12-05: qty 13.9

## 2023-12-05 MED ORDER — SODIUM CHLORIDE 0.9 % IV SOLN
INTRAVENOUS | Status: DC
  Filled 2023-12-05: qty 250

## 2023-12-05 MED ORDER — DEXAMETHASONE SOD PHOSPHATE PF 10 MG/ML IJ SOLN
10.0000 mg | Freq: Once | INTRAMUSCULAR | Status: AC
  Administered 2023-12-05: 10 mg via INTRAVENOUS

## 2023-12-05 NOTE — Assessment & Plan Note (Signed)
 She is asymptomatic, possibly due to anxiety associated with cancer diagnosis 10/25/2023 LVEF 65-70%  Grade I diastolic dysfunction I have referred cardiology evaluation

## 2023-12-05 NOTE — Assessment & Plan Note (Signed)
 Recommend patient to use Imodium PRN as directed If symptoms persist, plan to switch to Lomotil.

## 2023-12-05 NOTE — Assessment & Plan Note (Signed)
 Chemotherapy induced rash, improved with a course of prednisone.  Recommend patient to take Dexamethasone  8mg  daily x 3

## 2023-12-05 NOTE — Assessment & Plan Note (Signed)
 Hyperglycemia due to recent steroid use.  Check A1c

## 2023-12-05 NOTE — Assessment & Plan Note (Addendum)
 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  (She is not interested in scalp cooling)  Labs are reviewed and discussed with patient. Proceed with cycle 2 TCHP with D3 GCSF. Recommend Claritin 10mg  daily x 4 days Continue supportive care. I offered her to get IVF hydration sessions next week. She declined. She will call office if she feels needed

## 2023-12-05 NOTE — Assessment & Plan Note (Signed)
 Chemotherapy treatments as planned.

## 2023-12-05 NOTE — Progress Notes (Signed)
 Hematology/Oncology Progress note [phone] [phone]       CHIEF COMPLAINTS/REASON FOR VISIT:  Stage IIA HER2 positive breast cancer.  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  (She is not interested in scalp cooling)  Labs are reviewed and discussed with patient. Proceed with cycle 2 TCHP with D3 GCSF. Recommend Claritin 10mg  daily x 4 days Continue supportive care. I offered her to get IVF hydration sessions next week. She declined. She will call office if she feels needed  Chemotherapy induced diarrhea Recommend patient to use Imodium PRN as directed If symptoms persist, plan to switch to Lomotil.   Encounter for antineoplastic chemotherapy Chemotherapy treatments as planned.   Mucositis Patient does not like Magic mouthwash and prefers to use baking soda mouth wash  Sinus tachycardia She is asymptomatic, possibly due to anxiety associated with cancer diagnosis 10/25/2023 LVEF 65-70%  Grade I diastolic dysfunction I have referred cardiology evaluation    Skin rash Chemotherapy induced rash, improved with a course of prednisone.  Recommend patient to take Dexamethasone  8mg  daily x 3  Prediabetes Hyperglycemia due to recent steroid use.  Check A1c  Patient declined influenza vaccination No orders of the defined types were placed in this encounter.   Follow up  3 weeks lab MD chemo Patient all questions were answered. The patient knows to call the clinic with any problems, questions or concerns.  Call Babara, MD, PhD [facility] Hematology Oncology 12/05/2023     HISTORY OF  PRESENTING ILLNESS:  Annette Phillips is a  58 y.o.  female with PMH listed below who presents for follow up for breast cancer, history of iron  deficiency anemia  History of iron  deficiency anemia.  09/14/21 EGD: Small hiatal hernia, nonobstructing Schatzki ring, stomach and duodenum normal.  Colonoscopy: Bleeding hemorrhoids grade 3.  10/08/21 capsule study negative.  S/p hemorrhoid banding in Sept 2024, recently started to have rectal bleeding.   Oncology History  Invasive carcinoma of breast (HCC)  09/18/2023 Mammogram   Bilateral screening mammogram  In the right breast, possible asymmetries warrant further evaluation. In the left breast, no findings suspicious for malignancy.   09/22/2023 Mammogram   Right breast diagnostic mammogram   1. There is a highly suspicious 10 mm mass in the RIGHT upper inner breast. Recommend ultrasound-guided biopsy for definitive characterization. 2. There is a sonographically identified additional 6 mm mass in the RIGHT upper inner breast which is indeterminate. Recommend ultrasound-guided biopsy for definitive characterization. 3. No suspicious RIGHT axillary adenopathy.   09/28/2023 Initial Diagnosis   Invasive carcinoma of breast (HCC)  S/p right breast biopsy.   1. Breast, right, needle core biopsy, 2:00 6cmfn 10mm (ribbon clip) - INVASIVE DUCTAL CARCINOMA, SEE NOTE - TUBULE FORMATION: SCORE 3 - NUCLEAR PLEOMORPHISM: SCORE 3 - MITOTIC COUNT: SCORE 3 - TOTAL SCORE: 9 - OVERALL GRADE: 3 - LYMPHOVASCULAR INVASION: NOT IDENTIFIED - CANCER LENGTH: 0.7 CM - CALCIFICATIONS: NOT IDENTIFIED - OTHER FINDINGS: NONE 2. Breast, right, needle core biopsy, 1:00 3cmfn 6mm (venus clip) - INVASIVE DUCTAL CARCINOMA, SEE NOTE - TUBULE FORMATION: SCORE 3 - NUCLEAR PLEOMORPHISM: SCORE 3 - MITOTIC COUNT: SCORE  3 - TOTAL SCORE: 9 - OVERALL GRADE: 3 - LYMPHOVASCULAR INVASION: NOT IDENTIFIED - CANCER LENGTH: 0.5 CM - CALCIFICATIONS: NOT IDENTIFIED - OTHER  FINDINGS: NONE  1. 1) Breast, right, needle core biopsy, 2:00 6 cmfn 10mm ( ribbon clip) PROGNOSTIC INDICATORS Results: IMMUNOHISTOCHEMICAL AND MORPHOMETRIC ANALYSIS PERFORMED MANUALLY The tumor cells are POSITIVE for Her2 (3+). Estrogen Receptor: 0%, NEGATIVE Progesterone Receptor: 0%, NEGATIVE Proliferation Marker Ki67: 50%     10/13/2023 Cancer Staging   Staging form: Breast, AJCC 8th Edition - Clinical stage from 10/13/2023: Stage IIA (cT2, cN0, cM0, G3, ER-, PR-, HER2+) - Signed by [name], MD on 10/24/2023 Stage prefix: Initial diagnosis Histologic grading system: 3 grade system   10/20/2023 Imaging   Bilateral MRI breast with and without contrast showed 1. Two sites of UPPER-OUTER RIGHT breast biopsy-proven malignancy spanning a total distance of 4.2 cm (see above). 2. No other MR evidence of malignancy within either breast. No abnormal appearing lymph nodes.   11/14/2023 -  Chemotherapy   Patient is on Treatment Plan : BREAST  Docetaxel + Carboplatin + Trastuzumab + Pertuzumab  (TCHP) q21d        Status post cycle 1 treatment. She was seen by Dr. Cintron for malfunctioning medi port.  She developed a rash on her face after her last visit, which resolved with prednisone. No significant diarrhea was noted, and she did not need to use Imodium. She does not like magic mouthwash due to due to discomfort and breathing issues and  has since switched to a baking soda and salt water rinse for mouth sores, which she finds effective.   She experiences occasional 'twinges' in her feet at the end of the day but denies numbness, tingling, or issues with her hands. She reports a high heart rate but denies palpitations or chest pain. An appointment with a cardiologist is scheduled for the 29th.    MEDICAL HISTORY:  Past Medical History:  Diagnosis Date   Allergy    Penicillin, Aspirin   Anemia July 2023   Blood transfusion without reported diagnosis July 2023   Cancer [facility])     Goiter    Hypothyroid    Internal hemorrhoid, bleeding 12/2022   Iron  deficiency anemia    Pre-diabetes    Recurrent cold sores    Sinus tachycardia     SURGICAL HISTORY: Past Surgical History:  Procedure Laterality Date   APPENDECTOMY     BREAST BIOPSY Right 05/21/2009   benign   BREAST BIOPSY Right 09/28/2023   US  RT BREAST BX W LOC DEV 1ST LESION IMG BX SPEC US  GUIDE 09/28/2023 [facility]   BREAST BIOPSY Right 09/28/2023   US  RT BREAST BX W LOC DEV EA ADD LESION IMG BX SPEC US  GUIDE 09/28/2023 [facility]   CHOLECYSTECTOMY N/A 02/28/2019   Procedure: LAPAROSCOPIC CHOLECYSTECTOMY;  Surgeon: Desiderio Schanz, MD;  Location: [facility];  Service: General;  Laterality: N/A;   COLONOSCOPY WITH PROPOFOL  N/A 09/14/2021   Procedure: COLONOSCOPY WITH PROPOFOL ;  Surgeon: Jinny Carmine, MD;  Location: [facility] ENDOSCOPY;  Service: Endoscopy;  Laterality: N/A;   DE QUERVAIN'S RELEASE Right 06/2022   ESOPHAGOGASTRODUODENOSCOPY (EGD) WITH PROPOFOL  N/A 02/27/2019   Procedure: ESOPHAGOGASTRODUODENOSCOPY (EGD) WITH PROPOFOL ;  Surgeon: Unk Corinn Skiff, MD;  Location: [facility] ENDOSCOPY;  Service: Gastroenterology;  Laterality: N/A;   ESOPHAGOGASTRODUODENOSCOPY (EGD) WITH PROPOFOL  N/A 09/14/2021   Procedure: ESOPHAGOGASTRODUODENOSCOPY (EGD) WITH PROPOFOL ;  Surgeon: Jinny Carmine, MD;  Location: [facility] ENDOSCOPY;  Service: Endoscopy;  Laterality: N/A;   GIVENS CAPSULE STUDY N/A  09/15/2021   Procedure: GIVENS CAPSULE STUDY;  Surgeon: Jinny Carmine, MD;  Location[facility];  Service: Endoscopy;  Laterality: N/A;   HEMORRHOID BANDING  10/20/2022   HEMORRHOID SURGERY N/A 12/28/2022   Procedure: HEMORRHOIDECTOMY;  Surgeon: Rodolph Romano, MD;  Location: [facility] ORS;  Service: General;  Laterality: N/A;   KNEE ARTHROSCOPY Left 01/04/2007   torn meniscus   PORT A CATH REVISION Left 12/01/2023   Procedure: PORT A CATH REVISION;  Surgeon: Rodolph Romano, MD;  Location: [facility] OR[facility];  Service:  General;  Laterality: Left;   PORTACATH PLACEMENT N/A 11/08/2023   Procedure: INSERTION, TUNNELED CENTRAL VENOUS DEVICE, WITH PORT;  Surgeon: Rodolph Romano, MD;  Location: [facility] ORS;  Service: General;  Laterality: N/A;   VITRECTOMY Right 09/2022    SOCIAL HISTORY: Social History   Socioeconomic History   Marital status: Single    Spouse name: Not on file   Number of children: 3   Years of education: Not on file   Highest education level: Bachelor's degree (e.g., BA, AB, BS)  Occupational History   Not on file  Tobacco Use   Smoking status: Never   Smokeless tobacco: Never  Vaping Use   Vaping status: Never Used  Substance and Sexual Activity   Alcohol use: Never   Drug use: Never   Sexual activity: Not Currently    Birth control/protection: Abstinence, Post-menopausal  Other Topics Concern   Not on file  Social History Narrative   Lives alone   Social Drivers of Health   Financial Resource Strain: Low Risk  (07/31/2023)   Overall Financial Resource Strain (CARDIA)    Difficulty of Paying Living Expenses: Not hard at all  Food Insecurity: No Food Insecurity (11/22/2023)   Hunger Vital Sign    Worried About Running Out of Food in the Last Year: Never true    Ran Out of Food in the Last Year: Never true  Transportation Needs: No Transportation Needs (11/22/2023)   PRAPARE - Administrator, Civil Service (Medical): No    Lack of Transportation (Non-Medical): No  Physical Activity: Sufficiently Active (07/31/2023)   Exercise Vital Sign    Days of Exercise per Week: 4 days    Minutes of Exercise per Session: 50 min  Stress: No Stress Concern Present (07/31/2023)   [employer] of Occupational Health - Occupational Stress Questionnaire    Feeling of Stress: Not at all  Social Connections: Moderately Integrated (11/22/2023)   Social Connection and Isolation Panel    Frequency of Communication with Friends and Family: More than three times a week     Frequency of Social Gatherings with Friends and Family: More than three times a week    Attends Religious Services: More than 4 times per year    Active Member of [employer] or Organizations: Yes    Attends Engineer, structural: More than 4 times per year    Marital Status: Divorced  Catering manager Violence: Not on file    FAMILY HISTORY: Family History  Problem Relation Age of Onset   Thyroid  disease Mother    Diabetes Mellitus II Mother    Diabetes Mother    Thyroid  disease Father    Healthy Sister    Hypothyroidism Daughter    Diabetes Son    Hypothyroidism Son    Diabetes Mellitus I Son    Kidney cancer Maternal Grandmother    Lung cancer Maternal Grandfather        smoker   Colon cancer Neg  Hx    Breast cancer Neg Hx     ALLERGIES:  is allergic to aspirin and penicillin g.  MEDICATIONS:  Current Outpatient Medications  Medication Sig Dispense Refill   acetaminophen  (TYLENOL ) 500 MG tablet Take 1,000 mg by mouth every 6 (six) hours as needed (pain.).     dexamethasone  (DECADRON ) 4 MG tablet Take 2 tabs by mouth 2 times daily starting day before chemo. Then take 2 tabs daily for 2 days starting day after chemo. Take with food. 30 tablet 1   hydrocortisone  1 % ointment Apply sparingly 1-2 times a day for no more than 2 weeks at a time for chemotherapy induced rash. 30 g 0   ibuprofen  (ADVIL ) 200 MG tablet Take 400-600 mg by mouth every 8 (eight) hours as needed (pain.).     levothyroxine  (SYNTHROID ) 88 MCG tablet TAKE 1 TABLET BY MOUTH DAILY BEFORE BREAKFAST. 90 tablet 2   lidocaine -prilocaine  (EMLA ) cream Apply to affected area once 30 g 3   loperamide (IMODIUM) 2 MG capsule Take 1 capsule (2 mg total) by mouth See admin instructions. Initial: 4 mg,the 2 mg every 2 hours (4 mg every 4 hours at night)  maximum: 16 mg/day 60 capsule 2   loratadine (CLARITIN) 10 MG tablet Take 10 mg by mouth daily.     Multiple Vitamin (MULTIVITAMIN WITH MINERALS) TABS tablet Take 1  tablet by mouth in the morning.     ondansetron  (ZOFRAN ) 8 MG tablet Take 1 tablet (8 mg total) by mouth every 8 (eight) hours as needed for nausea or vomiting. Start on the third day after chemotherapy. 30 tablet 1   predniSONE (STERAPRED UNI-PAK 21 TAB) 10 MG (21) TBPK tablet Day 1 take 6 tablets, Day 2 take 5 tablets, Day 3 take 4 tablets, Day 4 take 3 tablets, Day 5 take 2 tablets D6 take 1 tablet 1 each 0   prochlorperazine  (COMPAZINE ) 10 MG tablet Take 1 tablet (10 mg total) by mouth every 6 (six) hours as needed for nausea or vomiting. 30 tablet 1   Psyllium (METAMUCIL PO) Take 3 capsules by mouth in the morning and at bedtime. 2  to 3 capules     calcium-vitamin D (OSCAL WITH D) 500-5 MG-MCG tablet Take 2 tablets by mouth 2 (two) times daily. (Patient not taking: Reported on 12/05/2023)     No current facility-administered medications for this visit.   Facility-Administered Medications Ordered in Other Visits  Medication Dose Route Frequency Provider Last Rate Last Admin   0.9 %  sodium chloride  infusion   Intravenous Continuous [name], MD   Stopped at 12/05/23 1412    Review of Systems  Constitutional:  Positive for fatigue. Negative for appetite change, chills and fever.  HENT:   Negative for hearing loss and voice change.   Eyes:  Negative for eye problems.  Respiratory:  Negative for chest tightness and cough.   Cardiovascular:  Negative for chest pain.  Gastrointestinal:  Negative for abdominal distention, abdominal pain, blood in stool and diarrhea.  Endocrine: Negative for hot flashes.  Genitourinary:  Negative for difficulty urinating and frequency.   Musculoskeletal:  Negative for arthralgias.  Skin:  Positive for rash. Negative for itching.  Neurological:  Negative for extremity weakness.  Hematological:  Negative for adenopathy.  Psychiatric/Behavioral:  Negative for confusion.     PHYSICAL EXAMINATION: ECOG PERFORMANCE STATUS: 1 - Symptomatic but completely  ambulatory Vitals:   12/05/23 0948 12/05/23 0950  BP:  (!) 147/92  Pulse:  ROLLEN)  110  Resp: 18 18  Temp:  97.7 F (36.5 C)  SpO2:  98%   Filed Weights   12/05/23 0947  Weight: 155 lb (70.3 kg)    Physical Exam Constitutional:      General: She is not in acute distress. HENT:     Head: Normocephalic and atraumatic.  Eyes:     General: No scleral icterus. Cardiovascular:     Rate and Rhythm: Normal rate.  Pulmonary:     Effort: Pulmonary effort is normal. No respiratory distress.  Abdominal:     General: There is no distension.  Musculoskeletal:        General: Normal range of motion.     Cervical back: Normal range of motion and neck supple.  Skin:    Findings: No erythema.     Comments: + Medi port   Neurological:     Mental Status: She is alert and oriented to person, place, and time. Mental status is at baseline.  Psychiatric:        Mood and Affect: Mood normal.      LABORATORY DATA:  I have reviewed the data as listed    Latest Ref Rng & Units 12/05/2023    9:02 AM 11/27/2023    9:23 AM 11/20/2023   10:33 AM  CBC  WBC 4.0 - 10.5 K/uL 7.8  17.6  2.3   Hemoglobin 12.0 - 15.0 g/dL 87.9  88.1  86.9   Hematocrit 36.0 - 46.0 % 35.2  34.5  38.0   Platelets 150 - 400 K/uL 238  135  86       Latest Ref Rng & Units 12/05/2023    9:02 AM 11/27/2023    9:23 AM 11/20/2023   10:33 AM  CMP  Glucose 70 - 99 mg/dL 759  843  92   BUN 6 - 20 mg/dL 24  14  17    Creatinine 0.44 - 1.00 mg/dL 9.26  9.26  9.41   Sodium 135 - 145 mmol/L 132  133  131   Potassium 3.5 - 5.1 mmol/L 3.8  3.9  3.9   Chloride 98 - 111 mmol/L 103  100  101   CO2 22 - 32 mmol/L 22  24  24    Calcium 8.9 - 10.3 mg/dL 9.0  8.9  8.6   Total Protein 6.5 - 8.1 g/dL 7.1  7.2  7.1   Total Bilirubin 0.0 - 1.2 mg/dL 0.5  0.5  0.6   Alkaline Phos 38 - 126 U/L 50  77  47   AST 15 - 41 U/L 33  55  25   ALT 0 - 44 U/L 38  82  34       Component Value Date/Time   IRON  86 04/10/2023 0815   IRON  26 (L)  11/26/2021 1336   TIBC 364 04/10/2023 0815   TIBC 424 11/26/2021 1336   FERRITIN 85 04/10/2023 0815   FERRITIN 11 (L) 11/26/2021 1336   IRONPCTSAT 24 04/10/2023 0815   IRONPCTSAT 6 (LL) 11/26/2021 1336     RADIOGRAPHIC STUDIES: I have personally reviewed the radiological images as listed and agreed with the findings in the report. DG Chest Port 1 View Result Date: 11/08/2023 EXAM: 1 VIEW(S) XRAY OF THE CHEST 11/08/2023 01:57:00 PM COMPARISON: None available. CLINICAL HISTORY: Port-A-Cath in place [phone]. S/p port placement. FINDINGS: LINES, TUBES AND DEVICES: Tunneled left IJ Port-A-Cath in place with tip at lower SVC level. LUNGS AND PLEURA: No focal pulmonary opacity. No pulmonary  edema. No pleural effusion. No pneumothorax. HEART AND MEDIASTINUM: No acute abnormality of the cardiac and mediastinal silhouettes. BONES AND SOFT TISSUES: No acute osseous abnormality. IMPRESSION: 1. Tunneled left IJ Port-A-Cath with tip at the lower SVC. 2. No pneumothorax identified. Electronically signed by: Waddell Calk MD 11/08/2023 02:26 PM EDT RP Workstation: [id number]   DG C-Arm 1-60 Min-No Report Result Date: 11/08/2023 Fluoroscopy was utilized by the requesting physician.  No radiographic interpretation.

## 2023-12-05 NOTE — Assessment & Plan Note (Signed)
 Patient does not like Magic mouthwash and prefers to use baking soda mouth wash

## 2023-12-07 ENCOUNTER — Inpatient Hospital Stay

## 2023-12-07 ENCOUNTER — Ambulatory Visit

## 2023-12-07 DIAGNOSIS — Z5111 Encounter for antineoplastic chemotherapy: Secondary | ICD-10-CM | POA: Diagnosis not present

## 2023-12-07 DIAGNOSIS — C50919 Malignant neoplasm of unspecified site of unspecified female breast: Secondary | ICD-10-CM

## 2023-12-07 MED ORDER — PEGFILGRASTIM-JMDB 6 MG/0.6ML ~~LOC~~ SOSY
6.0000 mg | PREFILLED_SYRINGE | Freq: Once | SUBCUTANEOUS | Status: AC
  Administered 2023-12-07: 6 mg via SUBCUTANEOUS
  Filled 2023-12-07: qty 0.6

## 2023-12-13 ENCOUNTER — Ambulatory Visit: Payer: Self-pay | Admitting: Internal Medicine

## 2023-12-13 ENCOUNTER — Ambulatory Visit
Admission: RE | Admit: 2023-12-13 | Discharge: 2023-12-13 | Disposition: A | Discharge status: Home / Self Care | Attending: Internal Medicine | Admitting: Internal Medicine

## 2023-12-13 ENCOUNTER — Telehealth: Payer: Self-pay | Admitting: Oncology

## 2023-12-13 ENCOUNTER — Encounter: Payer: Self-pay | Admitting: Internal Medicine

## 2023-12-13 ENCOUNTER — Other Ambulatory Visit: Payer: Self-pay | Admitting: *Deleted

## 2023-12-13 ENCOUNTER — Inpatient Hospital Stay

## 2023-12-13 ENCOUNTER — Telehealth: Payer: Self-pay | Admitting: *Deleted

## 2023-12-13 ENCOUNTER — Ambulatory Visit: Attending: Internal Medicine | Admitting: Internal Medicine

## 2023-12-13 VITALS — BP 112/78 | HR 160 | Ht 67.0 in | Wt 153.6 lb

## 2023-12-13 VITALS — BP 110/76 | HR 162

## 2023-12-13 DIAGNOSIS — Z9221 Personal history of antineoplastic chemotherapy: Secondary | ICD-10-CM | POA: Diagnosis not present

## 2023-12-13 DIAGNOSIS — R000 Tachycardia, unspecified: Secondary | ICD-10-CM | POA: Insufficient documentation

## 2023-12-13 DIAGNOSIS — E860 Dehydration: Secondary | ICD-10-CM

## 2023-12-13 DIAGNOSIS — I340 Nonrheumatic mitral (valve) insufficiency: Secondary | ICD-10-CM | POA: Insufficient documentation

## 2023-12-13 DIAGNOSIS — Z859 Personal history of malignant neoplasm, unspecified: Secondary | ICD-10-CM | POA: Diagnosis not present

## 2023-12-13 DIAGNOSIS — Z5111 Encounter for antineoplastic chemotherapy: Secondary | ICD-10-CM | POA: Diagnosis not present

## 2023-12-13 LAB — COMPREHENSIVE METABOLIC PANEL WITH GFR
ALT: 24 U/L (ref 0–44)
AST: 19 U/L (ref 15–41)
Albumin: 3.5 g/dL (ref 3.5–5.0)
Alkaline Phosphatase: 54 U/L (ref 38–126)
Anion gap: 11 (ref 5–15)
BUN: 12 mg/dL (ref 6–20)
CO2: 26 mmol/L (ref 22–32)
Calcium: 8.9 mg/dL (ref 8.9–10.3)
Chloride: 97 mmol/L — ABNORMAL LOW (ref 98–111)
Creatinine, Ser: 0.74 mg/dL (ref 0.44–1.00)
GFR, Estimated: >60 mL/min (ref >60)
Glucose, Bld: 140 mg/dL — ABNORMAL HIGH (ref 70–99)
Potassium: 3.8 mmol/L (ref 3.5–5.1)
Sodium: 134 mmol/L — ABNORMAL LOW (ref 135–145)
Total Bilirubin: 0.5 mg/dL (ref 0.0–1.2)
Total Protein: 7.3 g/dL (ref 6.5–8.1)

## 2023-12-13 LAB — T4, FREE: Free T4: 0.91 ng/dL (ref 0.61–1.12)

## 2023-12-13 LAB — CBC WITH DIFFERENTIAL/PLATELET
Abs Immature Granulocytes: 1.17 K/uL — ABNORMAL HIGH (ref 0.00–0.07)
Basophils Absolute: 0 K/uL (ref 0.0–0.1)
Basophils Relative: 0 %
Eosinophils Absolute: 0 K/uL (ref 0.0–0.5)
Eosinophils Relative: 0 %
HCT: 36.8 % (ref 36.0–46.0)
Hemoglobin: 12.3 g/dL (ref 12.0–15.0)
Immature Granulocytes: 7 %
Lymphocytes Relative: 10 %
Lymphs Abs: 1.7 K/uL (ref 0.7–4.0)
MCH: 31.1 pg (ref 26.0–34.0)
MCHC: 33.4 g/dL (ref 30.0–36.0)
MCV: 92.9 fL (ref 80.0–100.0)
Monocytes Absolute: 1.6 K/uL — ABNORMAL HIGH (ref 0.1–1.0)
Monocytes Relative: 9 %
Neutro Abs: 12.9 K/uL — ABNORMAL HIGH (ref 1.7–7.7)
Neutrophils Relative %: 74 %
Platelets: 106 K/uL — ABNORMAL LOW (ref 150–400)
RBC: 3.96 MIL/uL (ref 3.87–5.11)
RDW: 14.5 % (ref 11.5–15.5)
Smear Review: NORMAL
WBC: 17.4 K/uL — ABNORMAL HIGH (ref 4.0–10.5)
nRBC: 0.2 % (ref 0.0–0.2)

## 2023-12-13 LAB — TSH: TSH: 4.977 u[IU]/mL — ABNORMAL HIGH (ref 0.350–4.500)

## 2023-12-13 LAB — BRAIN NATRIURETIC PEPTIDE: B Natriuretic Peptide: 45.6 pg/mL (ref 0.0–100.0)

## 2023-12-13 LAB — D-DIMER, QUANTITATIVE: D-Dimer, Quant: 0.64 ug{FEU}/mL — ABNORMAL HIGH (ref 0.00–0.50)

## 2023-12-13 MED ORDER — SODIUM CHLORIDE 0.9 % IV SOLN
Freq: Once | INTRAVENOUS | Status: AC
  Filled 2023-12-13: qty 250

## 2023-12-13 NOTE — Telephone Encounter (Signed)
 pt just called and said she feels that she needs to come in for fluids because of her chemo she received last week. She said she left a vm with triage but hadn't heard back. She said she wanted to come today if possible. I told her I would reach out to the team. Please advise

## 2023-12-13 NOTE — Progress Notes (Signed)
 Cardiology Office Note:  .   Date:  12/13/2023  ID:  Annette Phillips, DOB 1966-01-09, MRN [id number] PCP: Annette Jon HERO, MD  [facility] Providers Cardiologist:  New Click to update primary MD,subspecialty MD or APP then REFRESH:1}    History of Present Illness: .   Annette Phillips is a 58 y.o. female with invasive ductal carcinoma of the right breast undergoing neoadjuvant chemotherapy, prediabetes, and anemia in the setting of hemorrhoidal bleeding, who has been referred by Dr. Babara for evaluation of sinus tachycardia.  Annette Phillips notes that elevated heart rates were first noticed around the time that she began chemotherapy last month.  She received her second round of chemotherapy last week, at which time her heart rate was around 110 bpm.  She has noticed some increased fatigue over the last month or 2 as well as some rare epistaxis since beginning chemotherapy but otherwise has felt okay.  She denies chest pain, shortness of breath, palpitations, lightheadedness, and edema.  She notes that she had tachycardia in 2023 in the setting of severe anemia from hemorrhoidal bleeding.  That had resolved after receiving PRBC transfusions and iron  supplementation as well as surgical intervention for her hemorrhoids.  Annette Phillips notes that she had some nausea/vomiting this past weekend that she attributes to something she ate.  She has not had any further nausea.  She also denies fevers and chills.  She tries to stay well-hydrated, including drinking Pedialyte.  ROS: See HPI  Studies Reviewed: SABRA   EKG Interpretation Date/Time:  Wednesday December 13 2023 90:73:71 EDT Ventricular Rate:  160 PR Interval:  130 QRS Duration:  68 QT Interval:  316 QTC Calculation: 515 R Axis:   69  Text Interpretation: Sinus tachycardia When compared with ECG of 02-Nov-2023 11:53, PR interval has decreased Vent. rate has increased BY  74 BPM Confirmed by Dwane Andres ([id number]) on 12/13/2023 9:35:26 AM     TTE (10/25/2023): Normal LV size and wall thickness.  LVEF 65-70% with normal wall motion and grade 1 diastolic dysfunction.  Normal RV size and function.  Normal biatrial size.  No pericardial effusion.  No significant valvular abnormality.  Risk Assessment/Calculations:             Physical Exam:   VS:  BP 112/78   Pulse (!) 160   Ht 5' 7 (1.702 m)   Wt 153 lb 9.6 oz (69.7 kg)   SpO2 97%   BMI 24.06 kg/m    Wt Readings from Last 3 Encounters:  12/13/23 153 lb 9.6 oz (69.7 kg)  12/05/23 155 lb (70.3 kg)  12/01/23 155 lb (70.3 kg)    General:  NAD.  Accompanied by her daughter. Neck: No JVD or HJR. Lungs: Clear to auscultation bilaterally without wheezes or crackles. Heart: Tachycardic but regular without murmurs. Abdomen: Soft, nontender, nondistended. Extremities: No lower extremity edema.  ASSESSMENT AND PLAN: .    Sinus tachycardia: Mild tachycardia noted earlier this month at oncology/infusion visits.  However, she is significantly more tachycardic today with a heart rate around 160 bpm (monitoring throughout our visit with pulseoximeter showed heart rates of 157-165 bpm).  Her EKG is most consistent with sinus tachycardia and less likely a supraventricular tachycardia (this was confirmed with Dr. Cindie).  Sinus tachycardia is almost always secondary to some other physiologic stressor.  This could include dehydration, infection, anemia, heart failure, pericardial effusion, and pulmonary embolism, to name a few possibilities.  Given how profound her sinus tachycardia is, I recommended  that Annette Phillips go to the emergency department for further evaluation; she refused.  We will therefore check STAT CBC, CMP, BNP, TSH, free T4, and D-dimer.  If any significant abnormality is identified, she will be instructed to go to the ER for further workup.  We will also arrange for expedited echocardiogram to exclude interval development of a pericardial effusion or significant  cardiomyopathy.  In the meantime, I have asked her to remain well-hydrated.  I am reluctant to add a beta-blocker or calcium channel blocker pending aforementioned testing and borderline low blood pressure.    Dispo: Return to clinic in 1 week.  Signed, Lonni Hanson, MD

## 2023-12-13 NOTE — Progress Notes (Signed)
 Discussed with patient that the heart rate is still high in the 160 and concerning. Pt did express that the cardiologist recommended the ER, do to high heart rate and elevated WBC and to get IVF. Pt refused to go to the ER, requesting to come to the clinic for IV fluids. I asked pt how they felt and pt. denied any signs or symptoms of distress. Pt stated, that the heart rate is usually in the 100 s and felt dehydrated . Pt verbalized that son is an EMT and if anything changes they will call son and go to the ER right away otherwise pt feels it is from the chemotherapy and getting the Filgrastim shot.

## 2023-12-13 NOTE — Telephone Encounter (Signed)
 Left vm x 2 to reach patient. Per Dr. Ulysses documentation. Patient should go to the ER due to elevated d-dimer. Patient received iv fluids today in cancer center. Dr. Babara was notified by Dr. Ulysses team that pt was advised to go to the ER due to the elevated d-dimer. Per documentation HR was 163 in fluid clinic today.  I was not successful in reaching patient to reiterate this to patient.

## 2023-12-13 NOTE — Telephone Encounter (Signed)
 Per Dr. Babara. I called patient and left a vm. Requested pt to follow Dr. Ulysses recommendation and go to the ER as previously recommended.

## 2023-12-13 NOTE — Patient Instructions (Signed)
 Medication Instructions:  Your physician recommends that you continue on your current medications as directed. Please refer to the Current Medication list given to you today.    *If you need a refill on your cardiac medications before your next appointment, please call your pharmacy*  Lab Work: Your provider would like for you to have following labs drawn today BNP, CBC w/diff, CMP, TSH, Free T4.     Testing/Procedures: Your physician has requested that you have an Limited echocardiogram. Echocardiography is a painless test that uses sound waves to create images of your heart. It provides your doctor with information about the size and shape of your heart and how well your heart's chambers and valves are working.   You may receive an ultrasound enhancing agent through an IV if needed to better visualize your heart during the echo. This procedure takes approximately one hour.  There are no restrictions for this procedure.  This will take place at [address] [facility] [address] [facility]) [address], [place] [address]  Please note: We ask at that you not bring children with you during ultrasound (echo/ vascular) testing. Due to room size and safety concerns, children are not allowed in the ultrasound rooms during exams. Our front office staff cannot provide observation of children in our lobby area while testing is being conducted. An adult accompanying a patient to their appointment will only be allowed in the ultrasound room at the discretion of the ultrasound technician under special circumstances. We apologize for any inconvenience.   Follow-Up: At [facility], you and your health needs are our priority.  As part of our continuing mission to provide you with exceptional heart care, our providers are all part of one team.  This team includes your primary Cardiologist (physician) and Advanced Practice Providers or APPs (Physician Assistants and Nurse Practitioners) who all work together to  provide you with the care you need, when you need it.  Your next appointment:   1 week(s)  Provider:   You may see Lonni Hanson, MD or one of the following Advanced Practice Providers on your designated Care Team:   Lonni Meager, NP Lesley Maffucci, PA-C Bernardino Bring, PA-C Cadence Logan, PA-C Tylene Lunch, NP Barnie Hila, NP

## 2023-12-14 ENCOUNTER — Ambulatory Visit (HOSPITAL_BASED_OUTPATIENT_CLINIC_OR_DEPARTMENT_OTHER)
Admission: RE | Admit: 2023-12-14 | Discharge: 2023-12-14 | Disposition: A | Discharge status: Home / Self Care | Source: Ambulatory Visit | Attending: Internal Medicine | Admitting: Internal Medicine

## 2023-12-14 DIAGNOSIS — Z0189 Encounter for other specified special examinations: Secondary | ICD-10-CM | POA: Diagnosis not present

## 2023-12-14 DIAGNOSIS — R000 Tachycardia, unspecified: Secondary | ICD-10-CM | POA: Diagnosis not present

## 2023-12-14 DIAGNOSIS — I517 Cardiomegaly: Secondary | ICD-10-CM

## 2023-12-14 LAB — ECHOCARDIOGRAM COMPLETE
AR max vel: 2.67 cm2
AV Area VTI: 2.96 cm2
AV Area mean vel: 2.74 cm2
AV Mean grad: 2 mmHg
AV Peak grad: 4.6 mmHg
Ao pk vel: 1.07 m/s
Area-P 1/2: 7.9 cm2
S' Lateral: 2.8 cm

## 2023-12-14 NOTE — Progress Notes (Signed)
*  PRELIMINARY RESULTS* Echocardiogram 2D Echocardiogram has been performed.  Annette Phillips 12/14/2023, 9:38 AM

## 2023-12-15 ENCOUNTER — Telehealth: Payer: Self-pay | Admitting: Internal Medicine

## 2023-12-15 NOTE — Telephone Encounter (Signed)
 Nurse Russell spoke with pt and noted Spoke with the patient and informed her of the results along with the MD's recommendations. The patient stated, "I have told you all that I'm not going to the emergency room, so stop calling me and asking." The nurse informed the patient that, as health care professionals, we can only provide recommendations, but the decision is ultimately hers. The nurse also informed the patient of the recommendation to start metoprolol . However, the patient refused, stating she does not want to start a medication she is unfamiliar with and will wait until her follow-up appointment to discuss further.   Will forward to MD to make aware.  Pt called back and stated she spoke with her kids about starting the new medication and since they want her to start it and she's had time to look it up. She now wants to start the Metoprolol  she was recommended. Please advise

## 2023-12-18 MED ORDER — METOPROLOL TARTRATE 25 MG PO TABS: 12.5000 mg | ORAL_TABLET | Freq: Two times a day (BID) | ORAL | 3 refills | Status: DC

## 2023-12-18 NOTE — Telephone Encounter (Signed)
 Recommend starting metoprolol  tartrate 12.5 mg twice daily with monitoring of blood pressure and heart rate at home.  Patient should follow-up later this week with Bernardino Bring, PA, as scheduled.  Lonni Hanson, MD [facility]

## 2023-12-18 NOTE — Telephone Encounter (Signed)
 Called patient to advise on metoprolol  - RX sent to [facility] in [place]

## 2023-12-19 ENCOUNTER — Ambulatory Visit: Attending: Physician Assistant | Admitting: Physician Assistant

## 2023-12-19 ENCOUNTER — Encounter: Payer: Self-pay | Admitting: Physician Assistant

## 2023-12-19 VITALS — BP 115/70 | HR 136 | Ht 67.0 in | Wt 153.6 lb

## 2023-12-19 DIAGNOSIS — R000 Tachycardia, unspecified: Secondary | ICD-10-CM

## 2023-12-19 NOTE — Patient Instructions (Signed)
 Medication Instructions:  Your physician recommends that you continue on your current medications as directed. Please refer to the Current Medication list given to you today.   *If you need a refill on your cardiac medications before your next appointment, please call your pharmacy*  Lab Work: None ordered at this time   Follow-Up: At [facility], you and your health needs are our priority.  As part of our continuing mission to provide you with exceptional heart care, our providers are all part of one team.  This team includes your primary Cardiologist (physician) and Advanced Practice Providers or APPs (Physician Assistants and Nurse Practitioners) who all work together to provide you with the care you need, when you need it.  Your next appointment:   1 month(s)  Provider:   You may see Lonni Hanson, MD or Bernardino Bring, PA-C    Other Instructions [employer] Kardia[employer] Personal EKG approx [phone]

## 2023-12-19 NOTE — Progress Notes (Signed)
 Cardiology Office Note    Date:  12/19/2023   ID:  Annette Phillips, DOB 09/28/1965, MRN [id number]  PCP:  Myrla Jon HERO, MD  Cardiologist:  Lonni Hanson, MD  Electrophysiologist:  None   Chief Complaint: Follow-up  History of Present Illness:   Annette Phillips is a 58 y.o. female with history of invasive ductal carcinoma of the right breast undergoing neoadjuvant chemotherapy, tachycardia, prediabetes, and anemia in the setting of hemorrhoidal bleeding  Echo in 10/2023 showed an EF of 65 to 70%, no regional wall motion abnormalities, grade 1 diastolic dysfunction, normal RV systolic function and ventricular cavity size, no significant valvular abnormality, and an estimated right atrial pressure of 3 mmHg.  She was referred to our office, and evaluated by Dr. Hanson on 12/13/2023 for elevated heart rates that were first noted around the time that she began chemotherapy in 10/2023.  She reported some increased fatigue over the preceding couple of months as well as rare epistaxis since beginning chemotherapy.  Heart rates were elevated in the office at 160 bpm with EKG felt to be representative of sinus tachycardia.  Given how profound her sinus tachycardia was it was recommended she proceed to the ER for further evaluation, however she refused.  Therefore labs were obtained as outlined below.  It was again recommended that she proceed to the ER.  She again declined.  She followed up with oncology and underwent IV infusion for possible dehydration.  Echo on 12/14/2023 showed an EF of 60 to 65%, no regional wall motion abnormalities, mild LVH, grade 1 diastolic dysfunction, normal RV systolic function, ventricular cavity size, and RVSP, mild mitral regurgitation, and an estimated right atrial pressure of 3 mmHg.  It was subsequently recommended that she start tartrate 12.5 mg twice daily and follow-up today.  She started metoprolol  to tartrate 12.5 mg last evening.  She comes in accompanied by her  daughter today and is without symptoms of angina or cardiac decompensation.  No dizziness, presyncope, or syncope.  No palpitations.  No significant lower extremity swelling or progressive orthopnea.  Reports her heart rate will be in the 70s to 80s bpm at rest at home by her pulse oximeter.  Notes heart rate variability with exertion at home as well with heart rates in the 120s bpm after ambulating 1 mile recently.  Reports heart rates are typically improved prior to completion of her chemotherapy treatments.  Has continued to decline ER evaluation.  She does feel a little fatigue after taking her second dose of metoprolol .   Labs independently reviewed: 11/2023 - BNP 45, Hgb 12.3, PLT 106, potassium 3.8, BUN 12, serum creatinine 0.74, albumin 3.5, AST/ALT normal D-dimer 0.64 TSH 4.977, free T4 normal 07/2023 - TC 186, TG 448, HDL 35, LDL 79, A1c 5.8  Past Medical History:  Diagnosis Date   Allergy    Penicillin, Aspirin   Anemia July 2023   Blood transfusion without reported diagnosis July 2023   Cancer [facility])    Goiter    Hypothyroid    Internal hemorrhoid, bleeding 12/2022   Iron  deficiency anemia    Pre-diabetes    Recurrent cold sores    Sinus tachycardia     Past Surgical History:  Procedure Laterality Date   APPENDECTOMY     BREAST BIOPSY Right 05/21/2009   benign   BREAST BIOPSY Right 09/28/2023   US  RT BREAST BX W LOC DEV 1ST LESION IMG BX SPEC US  GUIDE 09/28/2023 [facility]   BREAST BIOPSY Right 09/28/2023  US  RT BREAST BX W LOC DEV EA ADD LESION IMG BX SPEC US  GUIDE 09/28/2023 [facility]   CHOLECYSTECTOMY N/A 02/28/2019   Procedure: LAPAROSCOPIC CHOLECYSTECTOMY;  Surgeon: Desiderio Schanz, MD;  Location: [facility];  Service: General;  Laterality: N/A;   COLONOSCOPY WITH PROPOFOL  N/A 09/14/2021   Procedure: COLONOSCOPY WITH PROPOFOL ;  Surgeon: Jinny Carmine, MD;  Location: [facility]COPY;  Service: Endoscopy;  Laterality: N/A;   DE QUERVAIN'S RELEASE Right 06/2022    ESOPHAGOGASTRODUODENOSCOPY (EGD) WITH PROPOFOL  N/A 02/27/2019   Procedure: ESOPHAGOGASTRODUODENOSCOPY (EGD) WITH PROPOFOL ;  Surgeon: Unk Corinn Skiff, MD;  Location: [facility] ENDOSCOPY;  Service: Gastroenterology;  Laterality: N/A;   ESOPHAGOGASTRODUODENOSCOPY (EGD) WITH PROPOFOL  N/A 09/14/2021   Procedure: ESOPHAGOGASTRODUODENOSCOPY (EGD) WITH PROPOFOL ;  Surgeon: Jinny Carmine, MD;  Location: [facility] END[facility];  Service: Endoscopy;  Laterality: N/A;   GIVENS CAPSULE STUDY N/A 09/15/2021   Procedure: GIVENS CAPSULE STUDY;  Surgeon: Jinny Carmine, MD;  Location: [facility] ENDOSCOPY;  Service: Endoscopy;  Laterality: N/A;   HEMORRHOID BANDING  10/20/2022   HEMORRHOID SURGERY N/A 12/28/2022   Procedure: HEMORRHOIDECTOMY;  Surgeon: Rodolph Romano, MD;  Location: [facility]S;  Service: General;  Laterality: N/A;   KNEE ARTHROSCOPY Left 01/04/2007   torn meniscus   PORT A CATH REVISION Left 12/01/2023   Procedure: PORT A CATH REVISION;  Surgeon: Rodolph Romano, MD;  Location: [facility] ORS;  Service: General;  Laterality: Left;   PORTACATH PLACEMENT N/A 11/08/2023   Procedure: INSERTION, TUNNELED CENTRAL VENOUS DEVICE, WITH PORT;  Surgeon: Rodolph Romano, MD;  Location: [facility];  Service: General;  Laterality: N/A;   VITRECTOMY Right 09/2022    Current Medications: Current Meds  Medication Sig   acetaminophen  (TYLENOL ) 500 MG tablet Take 1,000 mg by mouth every 6 (six) hours as needed (pain.).   dexamethasone  (DECADRON ) 4 MG tablet Take 2 tabs by mouth 2 times daily starting day before chemo. Then take 2 tabs daily for 2 days starting day after chemo. Take with food.   hydrocortisone  1 % ointment Apply sparingly 1-2 times a day for no more than 2 weeks at a time for chemotherapy induced rash.   ibuprofen  (ADVIL ) 200 MG tablet Take 400-600 mg by mouth every 8 (eight) hours as needed (pain.).   levothyroxine  (SYNTHROID ) 88 MCG tablet TAKE 1 TABLET BY MOUTH DAILY BEFORE BREAKFAST.    lidocaine -prilocaine  (EMLA ) cream Apply to affected area once   loperamide (IMODIUM) 2 MG capsule Take 1 capsule (2 mg total) by mouth See admin instructions. Initial: 4 mg,the 2 mg every 2 hours (4 mg every 4 hours at night)  maximum: 16 mg/day   loratadine (CLARITIN) 10 MG tablet Take 10 mg by mouth daily.   metoprolol  tartrate (LOPRESSOR ) 25 MG tablet Take 0.5 tablets (12.5 mg total) by mouth 2 (two) times daily.   Multiple Vitamin (MULTIVITAMIN WITH MINERALS) TABS tablet Take 1 tablet by mouth in the morning.   ondansetron  (ZOFRAN ) 8 MG tablet Take 1 tablet (8 mg total) by mouth every 8 (eight) hours as needed for nausea or vomiting. Start on the third day after chemotherapy.   prochlorperazine  (COMPAZINE ) 10 MG tablet Take 1 tablet (10 mg total) by mouth every 6 (six) hours as needed for nausea or vomiting.   Psyllium (METAMUCIL PO) Take 3 capsules by mouth in the morning and at bedtime. 2  to 3 capules    Allergies:   Aspirin and Penicillin g   Social History   Socioeconomic History   Marital status: Single    Spouse name:  Not on file   Number of children: 3   Years of education: Not on file   Highest education level: Bachelor's degree (e.g., BA, AB, BS)  Occupational History   Not on file  Tobacco Use   Smoking status: Never   Smokeless tobacco: Never  Vaping Use   Vaping status: Never Used  Substance and Sexual Activity   Alcohol use: Never   Drug use: Never   Sexual activity: Not Currently    Birth control/protection: Abstinence, Post-menopausal  Other Topics Concern   Not on file  Social History Narrative   Lives alone   Social Drivers of Health   Financial Resource Strain: Low Risk  (07/31/2023)   Overall Financial Resource Strain (CARDIA)    Difficulty of Paying Living Expenses: Not hard at all  Food Insecurity: No Food Insecurity (11/22/2023)   Hunger Vital Sign    Worried About Running Out of Food in the Last Year: Never true    Ran Out of Food in the Last  Year: Never true  Transportation Needs: No Transportation Needs (11/22/2023)   PRAPARE - Administrator, Civil Service (Medical): No    Lack of Transportation (Non-Medical): No  Physical Activity: Sufficiently Active (07/31/2023)   Exercise Vital Sign    Days of Exercise per Week: 4 days    Minutes of Exercise per Session: 50 min  Stress: No Stress Concern Present (07/31/2023)   [employer] of Occupational Health - Occupational Stress Questionnaire    Feeling of Stress: Not at all  Social Connections: Moderately Integrated (11/22/2023)   Social Connection and Isolation Panel    Frequency of Communication with Friends and Family: More than three times a week    Frequency of Social Gatherings with Friends and Family: More than three times a week    Attends Religious Services: More than 4 times per year    Active Member of [employer] or Organizations: Yes    Attends Engineer, Structural: More than 4 times per year    Marital Status: Divorced     Family History:  The patient's family history includes Coronary artery disease (age of onset: 96) in her father; Diabetes in her mother and son; Diabetes Mellitus I in her son; Diabetes Mellitus II in her mother; Healthy in her sister; Hypothyroidism in her daughter and son; Kidney cancer in her maternal grandmother; Lung cancer in her maternal grandfather; Thyroid  disease in her father and mother. There is no history of Colon cancer or Breast cancer.  ROS:   12-point review of systems is negative unless otherwise noted in the HPI.   EKGs/Labs/Other Studies Reviewed:    Studies reviewed were summarized above. The additional studies were reviewed today:  2D echo 10/25/2023: 1. Left ventricular ejection fraction, by estimation, is 65 to 70%. The  left ventricle has normal function. The left ventricle has no regional  wall motion abnormalities. Left ventricular diastolic parameters are  consistent with Grade I diastolic   dysfunction (impaired relaxation). The global longitudinal strain is  normal.   2. Right ventricular systolic function is normal. The right ventricular  size is normal.   3. The mitral valve is normal in structure. No evidence of mitral valve  regurgitation. No evidence of mitral stenosis.   4. The aortic valve is normal in structure. Aortic valve regurgitation is  not visualized. No aortic stenosis is present.   5. The inferior vena cava is normal in size with greater than 50%  respiratory variability, suggesting  right atrial pressure of 3 mmHg.  __________  2D echo 12/14/2023: 1. Left ventricular ejection fraction, by estimation, is 60 to 65%. Left  ventricular ejection fraction by 3D volume is 84 %. Left ventricular  ejection fraction by PLAX is 55 %. The left ventricle has normal function.  The left ventricle has no regional  wall motion abnormalities. There is mild left ventricular hypertrophy.  Left ventricular diastolic parameters are consistent with Grade I  diastolic dysfunction (impaired relaxation). The average left ventricular  global longitudinal strain is -11.4 %. The  global longitudinal strain is abnormal.   2. Right ventricular systolic function is normal. The right ventricular  size is normal. There is normal pulmonary artery systolic pressure. The  estimated right ventricular systolic pressure is 24.4 mmHg.   3. The mitral valve is normal in structure. Mild mitral valve  regurgitation. No evidence of mitral stenosis.   4. The aortic valve is normal in structure. Aortic valve regurgitation is  not visualized. No aortic stenosis is present.   5. The inferior vena cava is normal in size with greater than 50%  respiratory variability, suggesting right atrial pressure of 3 mmHg.    EKG:  EKG is ordered today.  The EKG ordered today demonstrates sinus tachycardia, 136 bpm, low voltage QRS, no acute ST-T changes  Recent Labs: 12/13/2023: ALT 24; B Natriuretic Peptide  45.6; BUN 12; Creatinine, Ser 0.74; Hemoglobin 12.3; Platelets 106; Potassium 3.8; Sodium 134; TSH 4.977  Recent Lipid Panel    Component Value Date/Time   CHOL 186 08/01/2023 0000   TRIG 448 (H) 08/01/2023 0000   HDL 35 (L) 08/01/2023 0000   CHOLHDL 4.0 07/16/2019 0945   LDLCALC 79 08/01/2023 0000    PHYSICAL EXAM:    VS:  BP 115/70 (BP Location: Left Arm, Patient Position: Sitting, Cuff Size: Normal)   Pulse (!) 136 Comment: 146 oximeter  Ht 5' 7 (1.702 m)   Wt 153 lb 9.6 oz (69.7 kg)   SpO2 99%   BMI 24.06 kg/m   BMI: Body mass index is 24.06 kg/m.  Physical Exam Vitals reviewed.  Constitutional:      Appearance: She is well-developed.  HENT:     Head: Normocephalic and atraumatic.  Eyes:     General:        Right eye: No discharge.        Left eye: No discharge.  Cardiovascular:     Rate and Rhythm: Regular rhythm. Tachycardia present.     Pulses:          Posterior tibial pulses are 2+ on the right side and 2+ on the left side.     Heart sounds: Normal heart sounds, S1 normal and S2 normal. Heart sounds not distant. No midsystolic click and no opening snap. No murmur heard.    No friction rub.  Pulmonary:     Effort: Pulmonary effort is normal. No respiratory distress.     Breath sounds: Normal breath sounds. No decreased breath sounds, wheezing, rhonchi or rales.  Musculoskeletal:     Cervical back: Normal range of motion.     Right lower leg: No edema.     Left lower leg: No edema.  Skin:    General: Skin is warm and dry.     Nails: There is no clubbing.  Neurological:     Mental Status: She is alert and oriented to person, place, and time.  Psychiatric:        Speech: Speech normal.  Behavior: Behavior normal.        Thought Content: Thought content normal.        Judgment: Judgment normal.     Wt Readings from Last 3 Encounters:  12/19/23 153 lb 9.6 oz (69.7 kg)  12/13/23 153 lb 9.6 oz (69.7 kg)  12/05/23 155 lb (70.3 kg)     ASSESSMENT  & PLAN:   Sinus tachycardia: Completely asymptomatic.  EKG today consistent with sinus tachycardia.  Initially, I was concerned her underlying tachycardic rates may have been in the setting of atrial tachycardia.  However, with reported heart rate variation, and upon reviewing today's tracing, it appears this is more consistent with a sinus tachycardia.  We will have her continue metoprolol  titrate 12.5 mg twice daily to see how her heart rate responds to this.  To date, she has only taken 2 doses, therefore we will defer titration at this time.  Has historically declined ED evaluation for elevated D-dimer.  This was discussed with her today.  She reports heart rates will trended to the 70s to 80s bpm at rest at home.  She will attempt to get us  a tracing of this with a Kardia mobile or with her smart watch.  Continue with adequate hydration.     Disposition: F/u with Dr. Mady or an APP in 1 month.   Medication Adjustments/Labs and Tests Ordered: Current medicines are reviewed at length with the patient today.  Concerns regarding medicines are outlined above. Medication changes, Labs and Tests ordered today are summarized above and listed in the Patient Instructions accessible in Encounters.   Signed, Bernardino Bring, PA-C 12/19/2023 1:26 PM     [facility] [place] [address] [place], [place] [address] [phone]

## 2023-12-26 ENCOUNTER — Other Ambulatory Visit: Payer: Self-pay | Admitting: Oncology

## 2023-12-26 ENCOUNTER — Inpatient Hospital Stay

## 2023-12-26 ENCOUNTER — Encounter: Payer: Self-pay | Admitting: Oncology

## 2023-12-26 ENCOUNTER — Inpatient Hospital Stay (HOSPITAL_BASED_OUTPATIENT_CLINIC_OR_DEPARTMENT_OTHER): Admitting: Oncology

## 2023-12-26 ENCOUNTER — Inpatient Hospital Stay: Attending: Oncology

## 2023-12-26 VITALS — BP 148/95 | HR 99 | Temp 98.7°F | Resp 16

## 2023-12-26 VITALS — BP 160/88 | HR 127 | Temp 97.8°F | Resp 18 | Ht 67.0 in | Wt 152.0 lb

## 2023-12-26 DIAGNOSIS — Z5189 Encounter for other specified aftercare: Secondary | ICD-10-CM | POA: Diagnosis not present

## 2023-12-26 DIAGNOSIS — Z171 Estrogen receptor negative status [ER-]: Secondary | ICD-10-CM | POA: Diagnosis not present

## 2023-12-26 DIAGNOSIS — R197 Diarrhea, unspecified: Secondary | ICD-10-CM | POA: Insufficient documentation

## 2023-12-26 DIAGNOSIS — Z5112 Encounter for antineoplastic immunotherapy: Secondary | ICD-10-CM | POA: Diagnosis not present

## 2023-12-26 DIAGNOSIS — C50919 Malignant neoplasm of unspecified site of unspecified female breast: Secondary | ICD-10-CM

## 2023-12-26 DIAGNOSIS — Z1722 Progesterone receptor negative status: Secondary | ICD-10-CM | POA: Insufficient documentation

## 2023-12-26 DIAGNOSIS — Z8051 Family history of malignant neoplasm of kidney: Secondary | ICD-10-CM | POA: Insufficient documentation

## 2023-12-26 DIAGNOSIS — Z5111 Encounter for antineoplastic chemotherapy: Secondary | ICD-10-CM | POA: Insufficient documentation

## 2023-12-26 DIAGNOSIS — E871 Hypo-osmolality and hyponatremia: Secondary | ICD-10-CM | POA: Diagnosis not present

## 2023-12-26 DIAGNOSIS — Z1731 Human epidermal growth factor receptor 2 positive status: Secondary | ICD-10-CM | POA: Diagnosis not present

## 2023-12-26 DIAGNOSIS — R000 Tachycardia, unspecified: Secondary | ICD-10-CM | POA: Insufficient documentation

## 2023-12-26 DIAGNOSIS — Z801 Family history of malignant neoplasm of trachea, bronchus and lung: Secondary | ICD-10-CM | POA: Insufficient documentation

## 2023-12-26 DIAGNOSIS — D649 Anemia, unspecified: Secondary | ICD-10-CM | POA: Insufficient documentation

## 2023-12-26 DIAGNOSIS — C50911 Malignant neoplasm of unspecified site of right female breast: Secondary | ICD-10-CM | POA: Insufficient documentation

## 2023-12-26 LAB — CMP (CANCER CENTER ONLY)
ALT: 29 U/L (ref 0–44)
AST: 35 U/L (ref 15–41)
Albumin: 3.5 g/dL (ref 3.5–5.0)
Alkaline Phosphatase: 45 U/L (ref 38–126)
Anion gap: 10 (ref 5–15)
BUN: 19 mg/dL (ref 6–20)
CO2: 21 mmol/L — ABNORMAL LOW (ref 22–32)
Calcium: 8.9 mg/dL (ref 8.9–10.3)
Chloride: 103 mmol/L (ref 98–111)
Creatinine: 0.71 mg/dL (ref 0.44–1.00)
GFR, Estimated: >60 mL/min (ref >60)
Glucose, Bld: 185 mg/dL — ABNORMAL HIGH (ref 70–99)
Potassium: 3.6 mmol/L (ref 3.5–5.1)
Sodium: 134 mmol/L — ABNORMAL LOW (ref 135–145)
Total Bilirubin: 0.3 mg/dL (ref 0.0–1.2)
Total Protein: 7.2 g/dL (ref 6.5–8.1)

## 2023-12-26 LAB — CBC WITH DIFFERENTIAL (CANCER CENTER ONLY)
Abs Immature Granulocytes: 0.03 K/uL (ref 0.00–0.07)
Basophils Absolute: 0 K/uL (ref 0.0–0.1)
Basophils Relative: 0 %
Eosinophils Absolute: 0 K/uL (ref 0.0–0.5)
Eosinophils Relative: 0 %
HCT: 33.6 % — ABNORMAL LOW (ref 36.0–46.0)
Hemoglobin: 11.5 g/dL — ABNORMAL LOW (ref 12.0–15.0)
Immature Granulocytes: 1 %
Lymphocytes Relative: 22 %
Lymphs Abs: 1.3 K/uL (ref 0.7–4.0)
MCH: 31.5 pg (ref 26.0–34.0)
MCHC: 34.2 g/dL (ref 30.0–36.0)
MCV: 92.1 fL (ref 80.0–100.0)
Monocytes Absolute: 0.3 K/uL (ref 0.1–1.0)
Monocytes Relative: 5 %
Neutro Abs: 4.2 K/uL (ref 1.7–7.7)
Neutrophils Relative %: 72 %
Platelet Count: 245 K/uL (ref 150–400)
RBC: 3.65 MIL/uL — ABNORMAL LOW (ref 3.87–5.11)
RDW: 15.9 % — ABNORMAL HIGH (ref 11.5–15.5)
WBC Count: 5.8 K/uL (ref 4.0–10.5)
nRBC: 0 % (ref 0.0–0.2)

## 2023-12-26 MED ORDER — SODIUM CHLORIDE 0.9 % IV SOLN
INTRAVENOUS | Status: DC
  Filled 2023-12-26: qty 250

## 2023-12-26 MED ORDER — PALONOSETRON HCL INJECTION 0.25 MG/5ML
0.2500 mg | Freq: Once | INTRAVENOUS | Status: AC
  Administered 2023-12-26: 0.25 mg via INTRAVENOUS
  Filled 2023-12-26: qty 5

## 2023-12-26 MED ORDER — APREPITANT 130 MG/18ML IV EMUL
130.0000 mg | Freq: Once | INTRAVENOUS | Status: AC
  Administered 2023-12-26: 130 mg via INTRAVENOUS
  Filled 2023-12-26: qty 18

## 2023-12-26 MED ORDER — DEXAMETHASONE SOD PHOSPHATE PF 10 MG/ML IJ SOLN
10.0000 mg | Freq: Once | INTRAMUSCULAR | Status: AC
  Administered 2023-12-26: 10 mg via INTRAVENOUS

## 2023-12-26 MED ORDER — DIPHENHYDRAMINE HCL 25 MG PO CAPS
25.0000 mg | ORAL_CAPSULE | Freq: Once | ORAL | Status: AC
  Administered 2023-12-26: 25 mg via ORAL
  Filled 2023-12-26: qty 1

## 2023-12-26 MED ORDER — SODIUM CHLORIDE 0.9 % IV SOLN
75.0000 mg/m2 | Freq: Once | INTRAVENOUS | Status: AC
  Administered 2023-12-26: 139 mg via INTRAVENOUS
  Filled 2023-12-26: qty 13.9

## 2023-12-26 MED ORDER — ACETAMINOPHEN 325 MG PO TABS
650.0000 mg | ORAL_TABLET | Freq: Once | ORAL | Status: AC
  Administered 2023-12-26: 650 mg via ORAL
  Filled 2023-12-26: qty 2

## 2023-12-26 MED ORDER — TRASTUZUMAB-ANNS CHEMO 150 MG IV SOLR
6.0000 mg/kg | Freq: Once | INTRAVENOUS | Status: AC
  Administered 2023-12-26: 420 mg via INTRAVENOUS
  Filled 2023-12-26: qty 20

## 2023-12-26 MED ORDER — SODIUM CHLORIDE 0.9 % IV SOLN
420.0000 mg | Freq: Once | INTRAVENOUS | Status: AC
  Administered 2023-12-26: 420 mg via INTRAVENOUS
  Filled 2023-12-26: qty 14

## 2023-12-26 MED ORDER — SODIUM CHLORIDE 0.9 % IV SOLN
672.0000 mg | Freq: Once | INTRAVENOUS | Status: AC
  Administered 2023-12-26: 670 mg via INTRAVENOUS
  Filled 2023-12-26: qty 67

## 2023-12-26 NOTE — Progress Notes (Unsigned)
 Patient has some nausea and or diarrhea, but usually for about 2 days after treatment for only 24 hours. She just wants to feel like she is being heard.

## 2023-12-26 NOTE — Patient Instructions (Signed)
 [facility] - A DEPT OF [facility]  Discharge Instructions: Thank you for choosing [facility] to provide your oncology and hematology care.  If you have a lab appointment with the Cancer Center, please go directly to the Cancer Center and check in at the registration area.  Wear comfortable clothing and clothing appropriate for easy access to any Portacath or PICC line.   We strive to give you quality time with your provider. You may need to reschedule your appointment if you arrive late (15 or more minutes).  Arriving late affects you and other patients whose appointments are after yours.  Also, if you miss three or more appointments without notifying the office, you may be dismissed from the clinic at the provider's discretion.      For prescription refill requests, have your pharmacy contact our office and allow 72 hours for refills to be completed.    Today you received the following chemotherapy and/or immunotherapy agents KANJINTI, PERJETA, TAXOTERE, CARBOPLATIN      To help prevent nausea and vomiting after your treatment, we encourage you to take your nausea medication as directed.  BELOW ARE SYMPTOMS THAT SHOULD BE REPORTED IMMEDIATELY: *FEVER GREATER THAN 100.4 F (38 C) OR HIGHER *CHILLS OR SWEATING *NAUSEA AND VOMITING THAT IS NOT CONTROLLED WITH YOUR NAUSEA MEDICATION *UNUSUAL SHORTNESS OF BREATH *UNUSUAL BRUISING OR BLEEDING *URINARY PROBLEMS (pain or burning when urinating, or frequent urination) *BOWEL PROBLEMS (unusual diarrhea, constipation, pain near the anus) TENDERNESS IN MOUTH AND THROAT WITH OR WITHOUT PRESENCE OF ULCERS (sore throat, sores in mouth, or a toothache) UNUSUAL RASH, SWELLING OR PAIN  UNUSUAL VAGINAL DISCHARGE OR ITCHING   Items with * indicate a potential emergency and should be followed up as soon as possible or go to the Emergency Department if any problems should occur.  Please show the CHEMOTHERAPY  ALERT CARD or IMMUNOTHERAPY ALERT CARD at check-in to the Emergency Department and triage nurse.  Should you have questions after your visit or need to cancel or reschedule your appointment, please contact [facility] - A DEPT OF [facility]  [phone] and follow the prompts.  Office hours are 8:00 a.m. to 4:30 p.m. Monday - Friday. Please note that voicemails left after 4:00 p.m. may not be returned until the following business day.  We are closed weekends and major holidays. You have access to a nurse at all times for urgent questions. Please call the main number to the clinic [phone] and follow the prompts.  For any non-urgent questions, you may also contact your provider using MyChart. We now offer e-Visits for anyone 30 and older to request care online for non-urgent symptoms. For details visit [email or web address].   Also download the MyChart app! Go to the app store, search MyChart, open the app, select [facility], and log in with your MyChart username and password.   Trastuzumab Injection What is this medication? TRASTUZUMAB (tras TOO zoo mab) treats breast cancer and stomach cancer. It works by blocking a protein that causes cancer cells to grow and multiply. This helps to slow or stop the spread of cancer cells. This medicine may be used for other purposes; ask your health care provider or pharmacist if you have questions. COMMON BRAND NAME(S): Herceptin, HERCESSI, Herzuma, KANJINTI, Ogivri, Ontruzant, Trazimera What should I tell my care team before I take this medication? They need to know if you have any of these conditions:  Heart failure Lung disease An unusual or allergic reaction to trastuzumab, other medications, foods, dyes, or preservatives Pregnant or trying to get pregnant Breast-feeding How should I use this medication? This medication is injected into a vein. It is given by your care team in a hospital or clinic  setting. Talk to your care team about the use of this medication in children. It is not approved for use in children. Overdosage: If you think you have taken too much of this medicine contact a poison control center or emergency room at once. NOTE: This medicine is only for you. Do not share this medicine with others. What if I miss a dose? Keep appointments for follow-up doses. It is important not to miss your dose. Call your care team if you are unable to keep an appointment. What may interact with this medication? Certain types of chemotherapy, such as daunorubicin, doxorubicin, epirubicin, idarubicin This list may not describe all possible interactions. Give your health care provider a list of all the medicines, herbs, non-prescription drugs, or dietary supplements you use. Also tell them if you smoke, drink alcohol, or use illegal drugs. Some items may interact with your medicine. What should I watch for while using this medication? Your condition will be monitored carefully while you are receiving this medication. This medication may make you feel generally unwell. This is not uncommon, as chemotherapy affects healthy cells as well as cancer cells. Report any side effects. Continue your course of treatment even though you feel ill unless your care team tells you to stop. This medication may increase your risk of getting an infection. Call your care team for advice if you get a fever, chills, sore throat, or other symptoms of a cold or flu. Do not treat yourself. Try to avoid being around people who are sick. Avoid taking medications that contain aspirin, acetaminophen , ibuprofen , naproxen, or ketoprofen unless instructed by your care team. These medications can hide a fever. Talk to your care team if you may be pregnant. Serious birth defects can occur if you take this medication during pregnancy and for 7 months after the last dose. You will need a negative pregnancy test before starting this  medication. Contraception is recommended while taking this medication and for 7 months after the last dose. Your care team can help you find the option that works for you. Do not breastfeed while taking this medication and for 7 months after stopping treatment. What side effects may I notice from receiving this medication? Side effects that you should report to your care team as soon as possible: Allergic reactions or angioedema--skin rash, itching or hives, swelling of the face, eyes, lips, tongue, arms, or legs, trouble swallowing or breathing Dry cough, shortness of breath or trouble breathing Heart failure--shortness of breath, swelling of the ankles, feet, or hands, sudden weight gain, unusual weakness or fatigue Infection--fever, chills, cough, or sore throat Infusion reactions--chest pain, shortness of breath or trouble breathing, feeling faint or lightheaded Side effects that usually do not require medical attention (report to your care team if they continue or are bothersome): Diarrhea Dizziness Headache Nausea Trouble sleeping Vomiting This list may not describe all possible side effects. Call your doctor for medical advice about side effects. You may report side effects to FDA at 1-800-FDA-1088. Where should I keep my medication? This medication is given in a hospital or clinic. It will not be stored at home. NOTE: This sheet is a summary. It may not cover all possible information. If you  have questions about this medicine, talk to your doctor, pharmacist, or health care provider.  2024 Elsevier/Gold Standard (2021-06-15 00:00:00)  Pertuzumab Injection What is this medication? PERTUZUMAB (per TOOZ ue mab) treats breast cancer. It works by blocking a protein that causes cancer cells to grow and multiply. This helps to slow or stop the spread of cancer cells. It is a monoclonal antibody. This medicine may be used for other purposes; ask your health care provider or pharmacist if  you have questions. COMMON BRAND NAME(S): PERJETA What should I tell my care team before I take this medication? They need to know if you have any of these conditions: Heart failure An unusual or allergic reaction to pertuzumab, other medications, foods, dyes, or preservatives Pregnant or trying to get pregnant Breast-feeding How should I use this medication? This medication is injected into a vein. It is given by your care team in a hospital or clinic setting. Talk to your care team about the use of this medication in children. Special care may be needed. Overdosage: If you think you have taken too much of this medicine contact a poison control center or emergency room at once. NOTE: This medicine is only for you. Do not share this medicine with others. What if I miss a dose? Keep appointments for follow-up doses. It is important not to miss your dose. Call your care team if you are unable to keep an appointment. What may interact with this medication? Interactions are not expected. This list may not describe all possible interactions. Give your health care provider a list of all the medicines, herbs, non-prescription drugs, or dietary supplements you use. Also tell them if you smoke, drink alcohol, or use illegal drugs. Some items may interact with your medicine. What should I watch for while using this medication? Your condition will be monitored carefully while you are receiving this medication. This medication may make you feel generally unwell. This is not uncommon as chemotherapy can affect healthy cells as well as cancer cells. Report any side effects. Continue your course of treatment even though you feel ill unless your care team tells you to stop. Talk to your care team if you may be pregnant. Serious birth defects can occur if you take this medication during pregnancy and for 7 months after the last dose. You will need a negative pregnancy test before starting this medication.  Contraception is recommended while taking this medication and for 7 months after the last dose. Your care team can help you find the option that works for you. Do not breastfeed while taking this medication and for 7 months after the last dose. What side effects may I notice from receiving this medication? Side effects that you should report to your care team as soon as possible: Allergic reactions or angioedema--skin rash, itching or hives, swelling of the face, eyes, lips, tongue, arms, or legs, trouble swallowing or breathing Heart failure--shortness of breath, swelling of the ankles, feet, or hands, sudden weight gain, unusual weakness or fatigue Infusion reactions--chest pain, shortness of breath or trouble breathing, feeling faint or lightheaded Side effects that usually do not require medical attention (report to your care team if they continue or are bothersome): Diarrhea Dry skin Fatigue Hair loss Nausea Vomiting This list may not describe all possible side effects. Call your doctor for medical advice about side effects. You may report side effects to FDA at 1-800-FDA-1088. Where should I keep my medication? This medication is given in a hospital or clinic. It  will not be stored at home. NOTE: This sheet is a summary. It may not cover all possible information. If you have questions about this medicine, talk to your doctor, pharmacist, or health care provider.  2024 Elsevier/Gold Standard (2021-06-15 00:00:00)  Docetaxel Injection What is this medication? DOCETAXEL (doe se TAX el) treats some types of cancer. It works by slowing down the growth of cancer cells. This medicine may be used for other purposes; ask your health care provider or pharmacist if you have questions. COMMON BRAND NAME(S): BEIZRAY, Docefrez, Docivyx, Taxotere What should I tell my care team before I take this medication? They need to know if you have any of these conditions: Kidney disease Liver disease Low  white blood cell levels Tingling of the fingers or toes or other nerve disorder An unusual or allergic reaction to docetaxel, polysorbate 80, other medications, foods, dyes, or preservatives Pregnant or trying to get pregnant Breast-feeding How should I use this medication? This medication is injected into a vein. It is given by your care team in a hospital or clinic setting. Talk to your care team about the use of this medication in children. Special care may be needed. Overdosage: If you think you have taken too much of this medicine contact a poison control center or emergency room at once. NOTE: This medicine is only for you. Do not share this medicine with others. What if I miss a dose? Keep appointments for follow-up doses. It is important not to miss your dose. Call your care team if you are unable to keep an appointment. What may interact with this medication? Do not take this medication with any of the following: Live virus vaccines This medication may also interact with the following: Certain antibiotics, such as clarithromycin, telithromycin Certain antivirals for HIV or hepatitis Certain medications for fungal infections, such as itraconazole, ketoconazole, voriconazole Grapefruit juice Nefazodone Supplements, such as St. John's wort This list may not describe all possible interactions. Give your health care provider a list of all the medicines, herbs, non-prescription drugs, or dietary supplements you use. Also tell them if you smoke, drink alcohol, or use illegal drugs. Some items may interact with your medicine. What should I watch for while using this medication? This medication may make you feel generally unwell. This is not uncommon as chemotherapy can affect healthy cells as well as cancer cells. Report any side effects. Continue your course of treatment even though you feel ill unless your care team tells you to stop. You may need blood work done while you are taking this  medication. This medication can cause serious side effects and infusion reactions. To reduce the risk, your care team may give you other medications to take before receiving this one. Be sure to follow the directions from your care team. This medication may increase your risk of getting an infection. Call your care team for advice if you get a fever, chills, sore throat, or other symptoms of a cold or flu. Do not treat yourself. Try to avoid being around people who are sick. Avoid taking medications that contain aspirin, acetaminophen , ibuprofen , naproxen, or ketoprofen unless instructed by your care team. These medications may hide a fever. Be careful brushing or flossing your teeth or using a toothpick because you may get an infection or bleed more easily. If you have any dental work done, tell your dentist you are receiving this medication. Some products may contain alcohol. Ask your care team if this medication contains alcohol. Be sure to  tell all care teams you are taking this medicine. Certain medications, like metronidazole  and disulfiram, can cause an unpleasant reaction when taken with alcohol. The reaction includes flushing, headache, nausea, vomiting, sweating, and increased thirst. The reaction can last from 30 minutes to several hours. This medication may affect your coordination, reaction time, or judgement. Do not drive or operate machinery until you know how this medication affects you. Sit up or stand slowly to reduce the risk of dizzy or fainting spells. Drinking alcohol with this medication can increase the risk of these side effects. Talk to your care team about your risk of cancer. You may be more at risk for certain types of cancer if you take this medication. Talk to your care team if you wish to become pregnant or think you might be pregnant. This medication can cause serious birth defects if taken during pregnancy or if you get pregnant within 2 months after stopping therapy. A  negative pregnancy test is required before starting this medication. A reliable form of contraception is recommended while taking this medication and for 2 months after stopping it. Talk to your care team about reliable forms of contraception. Do not breast-feed while taking this medication and for 1 week after stopping therapy. Use a condom during sex and for 4 months after stopping therapy. Tell your care team right away if you think your partner might be pregnant. This medication can cause serious birth defects. This medication may cause infertility. Talk to your care team if you are concerned about your fertility. What side effects may I notice from receiving this medication? Side effects that you should report to your care team as soon as possible: Allergic reactions--skin rash, itching, hives, swelling of the face, lips, tongue, or throat Change in vision such as blurry vision, seeing halos around lights, vision loss Infection--fever, chills, cough, or sore throat Infusion reactions--chest pain, shortness of breath or trouble breathing, feeling faint or lightheaded Low red blood cell level--unusual weakness or fatigue, dizziness, headache, trouble breathing Pain, tingling, or numbness in the hands or feet Painful swelling, warmth, or redness of the skin, blisters or sores at the infusion site Redness, blistering, peeling, or loosening of the skin, including inside the mouth Sudden or severe stomach pain, bloody diarrhea, fever, nausea, vomiting Swelling of the ankles, hands, or feet Tumor lysis syndrome (TLS)--nausea, vomiting, diarrhea, decrease in the amount of urine, dark urine, unusual weakness or fatigue, confusion, muscle pain or cramps, fast or irregular heartbeat, joint pain Unusual bruising or bleeding Side effects that usually do not require medical attention (report to your care team if they continue or are bothersome): Change in nail shape, thickness, or color Change in  taste Hair loss Increased tears This list may not describe all possible side effects. Call your doctor for medical advice about side effects. You may report side effects to FDA at 1-800-FDA-1088. Where should I keep my medication? This medication is given in a hospital or clinic. It will not be stored at home. NOTE: This sheet is a summary. It may not cover all possible information. If you have questions about this medicine, talk to your doctor, pharmacist, or health care provider.  2024 Elsevier/Gold Standard (2021-04-08 00:00:00)  Carboplatin Injection What is this medication? CARBOPLATIN (KAR boe pla tin) treats some types of cancer. It works by slowing down the growth of cancer cells. This medicine may be used for other purposes; ask your health care provider or pharmacist if you have questions. COMMON BRAND NAME(S): Paraplatin  What should I tell my care team before I take this medication? They need to know if you have any of these conditions: Blood disorders Hearing problems Kidney disease Recent or ongoing radiation therapy An unusual or allergic reaction to carboplatin, cisplatin, other medications, foods, dyes, or preservatives Pregnant or trying to get pregnant Breast-feeding How should I use this medication? This medication is injected into a vein. It is given by your care team in a hospital or clinic setting. Talk to your care team about the use of this medication in children. Special care may be needed. Overdosage: If you think you have taken too much of this medicine contact a poison control center or emergency room at once. NOTE: This medicine is only for you. Do not share this medicine with others. What if I miss a dose? Keep appointments for follow-up doses. It is important not to miss your dose. Call your care team if you are unable to keep an appointment. What may interact with this medication? Medications for seizures Some antibiotics, such as amikacin, gentamicin,  neomycin, streptomycin, tobramycin Vaccines This list may not describe all possible interactions. Give your health care provider a list of all the medicines, herbs, non-prescription drugs, or dietary supplements you use. Also tell them if you smoke, drink alcohol, or use illegal drugs. Some items may interact with your medicine. What should I watch for while using this medication? Your condition will be monitored carefully while you are receiving this medication. You may need blood work while taking this medication. This medication may make you feel generally unwell. This is not uncommon, as chemotherapy can affect healthy cells as well as cancer cells. Report any side effects. Continue your course of treatment even though you feel ill unless your care team tells you to stop. In some cases, you may be given additional medications to help with side effects. Follow all directions for their use. This medication may increase your risk of getting an infection. Call your care team for advice if you get a fever, chills, sore throat, or other symptoms of a cold or flu. Do not treat yourself. Try to avoid being around people who are sick. Avoid taking medications that contain aspirin, acetaminophen , ibuprofen , naproxen, or ketoprofen unless instructed by your care team. These medications may hide a fever. Be careful brushing or flossing your teeth or using a toothpick because you may get an infection or bleed more easily. If you have any dental work done, tell your dentist you are receiving this medication. Talk to your care team if you wish to become pregnant or think you might be pregnant. This medication can cause serious birth defects. Talk to your care team about effective forms of contraception. Do not breast-feed while taking this medication. What side effects may I notice from receiving this medication? Side effects that you should report to your care team as soon as possible: Allergic reactions--skin  rash, itching, hives, swelling of the face, lips, tongue, or throat Infection--fever, chills, cough, sore throat, wounds that don't heal, pain or trouble when passing urine, general feeling of discomfort or being unwell Low red blood cell level--unusual weakness or fatigue, dizziness, headache, trouble breathing Pain, tingling, or numbness in the hands or feet, muscle weakness, change in vision, confusion or trouble speaking, loss of balance or coordination, trouble walking, seizures Unusual bruising or bleeding Side effects that usually do not require medical attention (report to your care team if they continue or are bothersome): Hair loss Nausea Unusual weakness or  fatigue Vomiting This list may not describe all possible side effects. Call your doctor for medical advice about side effects. You may report side effects to FDA at 1-800-FDA-1088. Where should I keep my medication? This medication is given in a hospital or clinic. It will not be stored at home. NOTE: This sheet is a summary. It may not cover all possible information. If you have questions about this medicine, talk to your doctor, pharmacist, or health care provider.  2024 Elsevier/Gold Standard (2021-05-25 00:00:00)

## 2023-12-28 ENCOUNTER — Inpatient Hospital Stay

## 2023-12-28 ENCOUNTER — Encounter: Payer: Self-pay | Admitting: Oncology

## 2023-12-28 ENCOUNTER — Other Ambulatory Visit: Payer: Self-pay | Admitting: Oncology

## 2023-12-28 DIAGNOSIS — C50919 Malignant neoplasm of unspecified site of unspecified female breast: Secondary | ICD-10-CM

## 2023-12-28 DIAGNOSIS — Z5111 Encounter for antineoplastic chemotherapy: Secondary | ICD-10-CM | POA: Diagnosis not present

## 2023-12-28 MED ORDER — PEGFILGRASTIM-JMDB 6 MG/0.6ML ~~LOC~~ SOSY
6.0000 mg | PREFILLED_SYRINGE | Freq: Once | SUBCUTANEOUS | Status: AC
  Administered 2023-12-28: 6 mg via SUBCUTANEOUS
  Filled 2023-12-28: qty 0.6

## 2023-12-28 NOTE — Progress Notes (Signed)
 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  INTERVAL HISTORY: Patient previously seen by another provider.  She returns to clinic today for further evaluations and consideration of cycle 3 of Taxotere, carboplatin, Herceptin, and Perjeta.  She has had increased nausea and diarrhea, but otherwise has been tolerating her treatments well.  She has no neurologic complaints.  She denies any recent fevers or illnesses.  She has a good appetite and denies weight loss.  She has no chest pain, shortness of breath, cough, wheeze.  She denies any vomiting or diarrhea.  She has no urinary complaints.  Patient patient offers no further specific complaints today.  REVIEW OF SYSTEMS:   Review of Systems  Constitutional:  Positive for malaise/fatigue. Negative for fever and weight loss.  Respiratory: Negative.  Negative for cough, hemoptysis and shortness of breath.   Cardiovascular: Negative.  Negative for chest pain and leg swelling.  Gastrointestinal:  Positive for diarrhea and nausea.  Genitourinary: Negative.  Negative for dysuria.  Musculoskeletal: Negative.  Negative for back pain.  Skin: Negative.  Negative for rash.  Neurological: Negative.  Negative for dizziness, focal weakness, weakness and headaches.  Psychiatric/Behavioral: Negative.  The patient is not nervous/anxious.    As per HPI. Otherwise, a complete review of systems is negative.    PAST MEDICAL HISTORY: Past Medical History:  Diagnosis Date   Allergy    Penicillin, Aspirin   Anemia July 2023   Blood transfusion without reported diagnosis July 2023   Cancer [facility])    Goiter    Hypothyroid     Internal hemorrhoid, bleeding 12/2022   Iron  deficiency anemia    Pre-diabetes    Recurrent cold sores    Sinus tachycardia     PAST SURGICAL HISTORY: Past Surgical History:  Procedure Laterality Date   APPENDECTOMY     BREAST BIOPSY Right 05/21/2009   benign   BREAST BIOPSY Right 09/28/2023   US  RT BREAST BX W LOC DEV 1ST LESION IMG BX SPEC US  GUIDE 09/28/2023 [facility]   BREAST BIOPSY Right 09/28/2023   US  RT BREAST BX W LOC DEV EA ADD LESION IMG BX SPEC US  GUIDE 09/28/2023 [facility]   CHOLECYSTECTOMY N/A 02/28/2019   Procedure: LAPAROSCOPIC CHOLECYSTECTOMY;  Surgeon: Desiderio Schanz, MD;  Location: [facility];  Service: General;  Laterality: N/A;   COLONOSCOPY WITH PROPOFOL  N/A 09/14/2021   Procedure: COLONOSCOPY WITH PROPOFOL ;  Surgeon: Jinny Carmine, MD;  Location: [facility];  Service: Endoscopy;  Laterality: N/A;   DE QUERVAIN'S RELEASE Right 06/2022   ESOPHAGOGASTRODUODENOSCOPY (EGD) WITH PROPOFOL  N/A 02/27/2019   Procedure: ESOPHAGOGASTRODUODENOSCOPY (EGD) WITH PROPOFOL ;  Surgeon: Unk Corinn Skiff, MD;  Location: [facility];  Service: Gastroenterology;  Laterality: N/A;   ESOPHAGOGASTRODUODENOSCOPY (EGD) WITH PROPOFOL  N/A 09/14/2021   Procedure: ESOPHAGOGASTRODUODENOSCOPY (EGD) WITH PROPOFOL ;  Surgeon: Jinny Carmine, MD;  Location: [facility];  Service: Endoscopy;  Laterality: N/A;   GIVENS CAPSULE STUDY N/A 09/15/2021   Procedure: GIVENS CAPSULE STUDY;  Surgeon: Jinny Carmine, MD;  Location: [facility] ENDOSCOPY;  Service: Endoscopy;  Laterality: N/A;   HEMORRHOID BANDING  10/20/2022   HEMORRHOID SURGERY N/A 12/28/2022   Procedure: HEMORRHOIDECTOMY;  Surgeon: Rodolph Romano, MD;  Location: [facility];  Service: General;  Laterality:  N/A;   KNEE ARTHROSCOPY Left 01/04/2007   torn meniscus   PORT A CATH REVISION Left 12/01/2023   Procedure: PORT A CATH REVISION;  Surgeon: Rodolph Romano, MD;  Location: [facility];  Service: General;  Laterality:  Left;   PORTACATH PLACEMENT N/A 11/08/2023   Procedure: INSERTION, TUNNELED CENTRAL VENOUS DEVICE, WITH PORT;  Surgeon: Rodolph Romano, MD;  Location: [facility];  Service: General;  Laterality: N/A;   VITRECTOMY Right 09/2022    FAMILY HISTORY: Family History  Problem Relation Age of Onset   Thyroid  disease Mother    Diabetes Mellitus II Mother    Diabetes Mother    Thyroid  disease Father    Coronary artery disease Father 67       CABG   Healthy Sister    Kidney cancer Maternal Grandmother    Lung cancer Maternal Grandfather        smoker   Hypothyroidism Daughter    Diabetes Son    Hypothyroidism Son    Diabetes Mellitus I Son    Colon cancer Neg Hx    Breast cancer Neg Hx     ADVANCED DIRECTIVES (Y/N):  N  HEALTH MAINTENANCE: Social History   Tobacco Use   Smoking status: Never   Smokeless tobacco: Never  Vaping Use   Vaping status: Never Used  Substance Use Topics   Alcohol use: Never   Drug use: Never     Colonoscopy:  PAP:  Bone density:  Lipid panel:  Allergies  Allergen Reactions   Aspirin Other (See Comments)    Childhood Allergy    Penicillin G Other (See Comments)    Childhood Allergy     Current Outpatient Medications  Medication Sig Dispense Refill   acetaminophen  (TYLENOL ) 500 MG tablet Take 1,000 mg by mouth every 6 (six) hours as needed (pain.).     dexamethasone  (DECADRON ) 4 MG tablet Take 2 tabs by mouth 2 times daily starting day before chemo. Then take 2 tabs daily for 2 days starting day after chemo. Take with food. 30 tablet 1   hydrocortisone  1 % ointment Apply sparingly 1-2 times a day for no more than 2 weeks at a time for chemotherapy induced rash. 30 g 0   ibuprofen  (ADVIL ) 200 MG tablet Take 400-600 mg by mouth every 8 (eight) hours as needed (pain.).     levothyroxine  (SYNTHROID ) 88 MCG tablet TAKE 1 TABLET BY MOUTH DAILY BEFORE BREAKFAST. 90 tablet 2   lidocaine -prilocaine  (EMLA ) cream Apply to affected area once 30 g 3    loperamide (IMODIUM) 2 MG capsule Take 1 capsule (2 mg total) by mouth See admin instructions. Initial: 4 mg,the 2 mg every 2 hours (4 mg every 4 hours at night)  maximum: 16 mg/day 60 capsule 2   loratadine (CLARITIN) 10 MG tablet Take 10 mg by mouth daily.     metoprolol  tartrate (LOPRESSOR ) 25 MG tablet Take 0.5 tablets (12.5 mg total) by mouth 2 (two) times daily. 90 tablet 3   Multiple Vitamin (MULTIVITAMIN WITH MINERALS) TABS tablet Take 1 tablet by mouth in the morning.     ondansetron  (ZOFRAN ) 8 MG tablet Take 1 tablet (8 mg total) by mouth every 8 (eight) hours as needed for nausea or vomiting. Start on the third day after chemotherapy. 30 tablet 1   prochlorperazine  (COMPAZINE ) 10 MG tablet Take 1 tablet (10 mg total) by mouth every 6 (six) hours as needed for nausea or vomiting. 30 tablet 1   Psyllium (METAMUCIL PO) Take  3 capsules by mouth in the morning and at bedtime. 2  to 3 capules     No current facility-administered medications for this visit.    OBJECTIVE: Vitals:   12/26/23 0905 12/26/23 0906  BP: (!) 157/99 (!) 160/88  Pulse: (!) 127   Resp: 18   Temp: 97.8 F (36.6 C)   SpO2: 98%      Body mass index is 23.81 kg/m.    ECOG FS:0 - Asymptomatic  General: Well-developed, well-nourished, no acute distress. Eyes: Pink conjunctiva, anicteric sclera. HEENT: Normocephalic, moist mucous membranes. Lungs: No audible wheezing or coughing. Heart: Regular rate and rhythm. Abdomen: Soft, nontender, no obvious distention. Musculoskeletal: No edema, cyanosis, or clubbing. Neuro: Alert, answering all questions appropriately. Cranial nerves grossly intact. Skin: No rashes or petechiae noted. Psych: Normal affect.  LAB RESULTS:  Lab Results  Component Value Date   NA 134 (L) 12/26/2023   K 3.6 12/26/2023   CL 103 12/26/2023   CO2 21 (L) 12/26/2023   GLUCOSE 185 (H) 12/26/2023   BUN 19 12/26/2023   CREATININE 0.71 12/26/2023   CALCIUM 8.9 12/26/2023   PROT 7.2  12/26/2023   ALBUMIN 3.5 12/26/2023   AST 35 12/26/2023   ALT 29 12/26/2023   ALKPHOS 45 12/26/2023   BILITOT 0.3 12/26/2023   GFRNONAA >60 12/26/2023   GFRAA 114 07/16/2019    Lab Results  Component Value Date   WBC 5.8 12/26/2023   NEUTROABS 4.2 12/26/2023   HGB 11.5 (L) 12/26/2023   HCT 33.6 (L) 12/26/2023   MCV 92.1 12/26/2023   PLT 245 12/26/2023     STUDIES: ECHOCARDIOGRAM COMPLETE Result Date: 12/14/2023    ECHOCARDIOGRAM REPORT   Patient Name:   Annette Phillips Date of Exam: 12/14/2023 Medical Rec #:  [id number]    Height:       67.0 in Accession #:    [id number]   Weight:       153.6 lb Date of Birth:  [birth year]   BSA:          1.808 m Patient Age:    57 years     BP:           110/76 mmHg Patient Gender: F            HR:           162 bpm. Exam Location:  [facility] Procedure: 2D Echo, 3D Echo, Color Doppler, Cardiac Doppler and Strain Analysis            (Both Spectral and Color Flow Doppler were utilized during            procedure). Indications:     Sinus Tachycardia R00.0                  Sinus tachycardia chemotherapy follow-up examination  History:         Patient has prior history of Echocardiogram examinations, most                  recent 10/25/2023. Anemia, cancer, sinus tachycardia.  Sonographer:     Christopher Furnace Referring Phys:  [phone] CHRISTOPHER END Diagnosing Phys: Evalene Lunger MD  Sonographer Comments: Global longitudinal strain was attempted. IMPRESSIONS  1. Left ventricular ejection fraction, by estimation, is 60 to 65%. Left ventricular ejection fraction by 3D volume is 84 %. Left ventricular ejection fraction by PLAX is 55 %. The left ventricle has normal function. The left ventricle has no regional wall motion abnormalities. There is  mild left ventricular hypertrophy. Left ventricular diastolic parameters are consistent with Grade I diastolic dysfunction (impaired relaxation). The average left ventricular global longitudinal strain is -11.4 %. The global longitudinal  strain is abnormal.  2. Right ventricular systolic function is normal. The right ventricular size is normal. There is normal pulmonary artery systolic pressure. The estimated right ventricular systolic pressure is 24.4 mmHg.  3. The mitral valve is normal in structure. Mild mitral valve regurgitation. No evidence of mitral stenosis.  4. The aortic valve is normal in structure. Aortic valve regurgitation is not visualized. No aortic stenosis is present.  5. The inferior vena cava is normal in size with greater than 50% respiratory variability, suggesting right atrial pressure of 3 mmHg. FINDINGS  Left Ventricle: Left ventricular ejection fraction, by estimation, is 60 to 65%. Left ventricular ejection fraction by PLAX is 55 %. Left ventricular ejection fraction by 3D volume is 84 %. The left ventricle has normal function. The left ventricle has no regional wall motion abnormalities. The average left ventricular global longitudinal strain is -11.4 %. Strain was performed and the global longitudinal strain is abnormal. The left ventricular internal cavity size was normal in size. There is mild left ventricular hypertrophy. Left ventricular diastolic parameters are consistent with Grade I diastolic dysfunction (impaired relaxation). Right Ventricle: The right ventricular size is normal. No increase in right ventricular wall thickness. Right ventricular systolic function is normal. There is normal pulmonary artery systolic pressure. The tricuspid regurgitant velocity is 2.20 m/s, and  with an assumed right atrial pressure of 5 mmHg, the estimated right ventricular systolic pressure is 24.4 mmHg. Left Atrium: Left atrial size was normal in size. Right Atrium: Right atrial size was normal in size. Pericardium: There is no evidence of pericardial effusion. Mitral Valve: The mitral valve is normal in structure. Mild mitral valve regurgitation. No evidence of mitral valve stenosis. Tricuspid Valve: The tricuspid valve is  normal in structure. Tricuspid valve regurgitation is not demonstrated. No evidence of tricuspid stenosis. Aortic Valve: The aortic valve is normal in structure. Aortic valve regurgitation is not visualized. No aortic stenosis is present. Aortic valve mean gradient measures 2.0 mmHg. Aortic valve peak gradient measures 4.6 mmHg. Aortic valve area, by VTI measures 2.96 cm. Pulmonic Valve: The pulmonic valve was normal in structure. Pulmonic valve regurgitation is not visualized. No evidence of pulmonic stenosis. Aorta: The aortic root is normal in size and structure. Venous: The inferior vena cava is normal in size with greater than 50% respiratory variability, suggesting right atrial pressure of 3 mmHg. IAS/Shunts: No atrial level shunt detected by color flow Doppler. Additional Comments: 3D was performed not requiring image post processing on an independent workstation and was normal.  LEFT VENTRICLE PLAX 2D LV EF:         Left            Diastology                ventricular     LV e' medial:    5.66 cm/s                ejection        LV E/e' medial:  9.5                fraction by     LV e' lateral:   6.96 cm/s                PLAX is 55      LV  E/e' lateral: 7.8                %. LVIDd:         3.90 cm         2D Longitudinal LVIDs:         2.80 cm         Strain LV PW:         1.10 cm         2D Strain GLS   -11.4 % LV IVS:        1.20 cm         Avg: LVOT diam:     2.00 cm LV SV:         40              3D Volume EF LV SV Index:   22              LV 3D EF:    Left LVOT Area:     3.14 cm                     ventricul                                             ar                                             ejection                                             fraction                                             by 3D                                             volume is                                             84 %.                                 3D Volume EF:                                3D EF:         84 % RIGHT VENTRICLE RV Basal diam:  1.70 cm RV Mid diam:    1.80 cm LEFT ATRIUM           Index        RIGHT ATRIUM          Index LA diam:  1.60 cm 0.89 cm/m   RA Area:     8.68 cm LA Vol (A2C): 10.6 ml 5.86 ml/m   RA Volume:   13.40 ml 7.41 ml/m LA Vol (A4C): 19.1 ml 10.57 ml/m  AORTIC VALVE AV Area (Vmax):    2.67 cm AV Area (Vmean):   2.74 cm AV Area (VTI):     2.96 cm AV Vmax:           107.00 cm/s AV Vmean:          66.200 cm/s AV VTI:            0.136 m AV Peak Grad:      4.6 mmHg AV Mean Grad:      2.0 mmHg LVOT Vmax:         91.00 cm/s LVOT Vmean:        57.800 cm/s LVOT VTI:          0.128 m LVOT/AV VTI ratio: 0.94  AORTA Ao Root diam: 3.00 cm MITRAL VALVE                TRICUSPID VALVE MV Area (PHT): 7.90 cm     TR Peak grad:   19.4 mmHg MV Decel Time: 96 msec      TR Vmax:        220.00 cm/s MV E velocity: 54.00 cm/s MV A velocity: 108.00 cm/s  SHUNTS MV E/A ratio:  0.50         Systemic VTI:  0.13 m                             Systemic Diam: 2.00 cm Evalene Lunger MD Electronically signed by Evalene Lunger MD Signature Date/Time: 12/14/2023/10:27:21 AM    Final     ASSESSMENT: 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  Patient's most recent cardiac echo on December 14, 2023 reported an EF of 60 to 65%.  Plan to do 6 treatments and then refer back to surgery.  Return to clinic in 2 days for G-CSF support and then in 3 weeks for further evaluation and consideration of cycle 4. Rash: Resolved with steroids.  Monitor. Diarrhea: Continue Imodium as needed. Tachycardia: Continue follow-up with cardiology as scheduled. Hyponatremia: Chronic and unchanged.  Patient sodium levels 134 today. Anemia: Mild, monitor.   Patient expressed understanding and was in  agreement with this plan. She also understands that She can call clinic at any time with any questions, concerns, or complaints.    Cancer Staging  Invasive carcinoma of breast (HCC) Staging form: Breast, AJCC 8th Edition - Clinical stage from 10/13/2023: Stage IIA (cT2, cN0, cM0, G3, ER-, PR-, HER2+) - Signed by [name], MD on 10/24/2023 Stage prefix: Initial diagnosis Histologic grading system: 3 grade system   Evalene JINNY Reusing, MD   12/28/2023 8:15 AM

## 2024-01-01 ENCOUNTER — Inpatient Hospital Stay

## 2024-01-01 DIAGNOSIS — Z5111 Encounter for antineoplastic chemotherapy: Secondary | ICD-10-CM | POA: Diagnosis not present

## 2024-01-01 DIAGNOSIS — E860 Dehydration: Secondary | ICD-10-CM

## 2024-01-01 MED ORDER — SODIUM CHLORIDE 0.9 % IV SOLN
Freq: Once | INTRAVENOUS | Status: AC
  Filled 2024-01-01: qty 250

## 2024-01-16 ENCOUNTER — Other Ambulatory Visit

## 2024-01-16 ENCOUNTER — Ambulatory Visit: Admitting: Oncology

## 2024-01-16 ENCOUNTER — Ambulatory Visit

## 2024-01-16 ENCOUNTER — Inpatient Hospital Stay: Attending: Oncology

## 2024-01-16 ENCOUNTER — Encounter: Payer: Self-pay | Admitting: Oncology

## 2024-01-16 ENCOUNTER — Inpatient Hospital Stay

## 2024-01-16 ENCOUNTER — Inpatient Hospital Stay: Attending: Oncology | Admitting: Oncology

## 2024-01-16 VITALS — BP 131/91 | HR 120 | Temp 97.8°F | Resp 18 | Ht 67.0 in | Wt 152.0 lb

## 2024-01-16 VITALS — HR 123

## 2024-01-16 DIAGNOSIS — R197 Diarrhea, unspecified: Secondary | ICD-10-CM | POA: Insufficient documentation

## 2024-01-16 DIAGNOSIS — Z1722 Progesterone receptor negative status: Secondary | ICD-10-CM | POA: Diagnosis not present

## 2024-01-16 DIAGNOSIS — Z1731 Human epidermal growth factor receptor 2 positive status: Secondary | ICD-10-CM | POA: Diagnosis not present

## 2024-01-16 DIAGNOSIS — R21 Rash and other nonspecific skin eruption: Secondary | ICD-10-CM | POA: Diagnosis not present

## 2024-01-16 DIAGNOSIS — Z5111 Encounter for antineoplastic chemotherapy: Secondary | ICD-10-CM | POA: Diagnosis present

## 2024-01-16 DIAGNOSIS — C50919 Malignant neoplasm of unspecified site of unspecified female breast: Secondary | ICD-10-CM

## 2024-01-16 DIAGNOSIS — Z801 Family history of malignant neoplasm of trachea, bronchus and lung: Secondary | ICD-10-CM | POA: Insufficient documentation

## 2024-01-16 DIAGNOSIS — Z5189 Encounter for other specified aftercare: Secondary | ICD-10-CM | POA: Insufficient documentation

## 2024-01-16 DIAGNOSIS — K1231 Oral mucositis (ulcerative) due to antineoplastic therapy: Secondary | ICD-10-CM | POA: Diagnosis not present

## 2024-01-16 DIAGNOSIS — R000 Tachycardia, unspecified: Secondary | ICD-10-CM | POA: Insufficient documentation

## 2024-01-16 DIAGNOSIS — Z171 Estrogen receptor negative status [ER-]: Secondary | ICD-10-CM | POA: Insufficient documentation

## 2024-01-16 DIAGNOSIS — Z5112 Encounter for antineoplastic immunotherapy: Secondary | ICD-10-CM | POA: Diagnosis not present

## 2024-01-16 DIAGNOSIS — C50911 Malignant neoplasm of unspecified site of right female breast: Secondary | ICD-10-CM | POA: Insufficient documentation

## 2024-01-16 DIAGNOSIS — Z8051 Family history of malignant neoplasm of kidney: Secondary | ICD-10-CM | POA: Diagnosis not present

## 2024-01-16 DIAGNOSIS — D649 Anemia, unspecified: Secondary | ICD-10-CM | POA: Diagnosis not present

## 2024-01-16 LAB — CMP (CANCER CENTER ONLY)
ALT: 33 U/L (ref 0–44)
AST: 27 U/L (ref 15–41)
Albumin: 3.9 g/dL (ref 3.5–5.0)
Alkaline Phosphatase: 45 U/L (ref 38–126)
Anion gap: 9 (ref 5–15)
BUN: 18 mg/dL (ref 6–20)
CO2: 23 mmol/L (ref 22–32)
Calcium: 9.6 mg/dL (ref 8.9–10.3)
Chloride: 104 mmol/L (ref 98–111)
Creatinine: 0.7 mg/dL (ref 0.44–1.00)
GFR, Estimated: >60 mL/min (ref >60)
Glucose, Bld: 105 mg/dL — ABNORMAL HIGH (ref 70–99)
Potassium: 3.6 mmol/L (ref 3.5–5.1)
Sodium: 136 mmol/L (ref 135–145)
Total Bilirubin: 0.5 mg/dL (ref 0.0–1.2)
Total Protein: 7.4 g/dL (ref 6.5–8.1)

## 2024-01-16 LAB — CBC WITH DIFFERENTIAL (CANCER CENTER ONLY)
Abs Immature Granulocytes: 0.03 K/uL (ref 0.00–0.07)
Basophils Absolute: 0 K/uL (ref 0.0–0.1)
Basophils Relative: 0 %
Eosinophils Absolute: 0 K/uL (ref 0.0–0.5)
Eosinophils Relative: 0 %
HCT: 34 % — ABNORMAL LOW (ref 36.0–46.0)
Hemoglobin: 11.5 g/dL — ABNORMAL LOW (ref 12.0–15.0)
Immature Granulocytes: 0 %
Lymphocytes Relative: 25 %
Lymphs Abs: 2.2 K/uL (ref 0.7–4.0)
MCH: 31.1 pg (ref 26.0–34.0)
MCHC: 33.8 g/dL (ref 30.0–36.0)
MCV: 91.9 fL (ref 80.0–100.0)
Monocytes Absolute: 1.4 K/uL — ABNORMAL HIGH (ref 0.1–1.0)
Monocytes Relative: 16 %
Neutro Abs: 5.1 K/uL (ref 1.7–7.7)
Neutrophils Relative %: 59 %
Platelet Count: 194 K/uL (ref 150–400)
RBC: 3.7 MIL/uL — ABNORMAL LOW (ref 3.87–5.11)
RDW: 16.9 % — ABNORMAL HIGH (ref 11.5–15.5)
WBC Count: 8.8 K/uL (ref 4.0–10.5)
nRBC: 0 % (ref 0.0–0.2)

## 2024-01-16 MED ORDER — APREPITANT 130 MG/18ML IV EMUL
130.0000 mg | Freq: Once | INTRAVENOUS | Status: AC
  Administered 2024-01-16: 130 mg via INTRAVENOUS
  Filled 2024-01-16: qty 18

## 2024-01-16 MED ORDER — SODIUM CHLORIDE 0.9 % IV SOLN
420.0000 mg | Freq: Once | INTRAVENOUS | Status: AC
  Administered 2024-01-16: 420 mg via INTRAVENOUS
  Filled 2024-01-16: qty 14

## 2024-01-16 MED ORDER — SODIUM CHLORIDE 0.9 % IV SOLN
665.4000 mg | Freq: Once | INTRAVENOUS | Status: AC
  Administered 2024-01-16: 670 mg via INTRAVENOUS
  Filled 2024-01-16: qty 67

## 2024-01-16 MED ORDER — ACETAMINOPHEN 325 MG PO TABS
650.0000 mg | ORAL_TABLET | Freq: Once | ORAL | Status: AC
  Administered 2024-01-16: 650 mg via ORAL
  Filled 2024-01-16: qty 2

## 2024-01-16 MED ORDER — PALONOSETRON HCL INJECTION 0.25 MG/5ML
0.2500 mg | Freq: Once | INTRAVENOUS | Status: AC
  Administered 2024-01-16: 0.25 mg via INTRAVENOUS
  Filled 2024-01-16: qty 5

## 2024-01-16 MED ORDER — DIPHENHYDRAMINE HCL 25 MG PO TABS
25.0000 mg | ORAL_TABLET | Freq: Once | ORAL | Status: AC
  Administered 2024-01-16: 25 mg via ORAL
  Filled 2024-01-16: qty 1

## 2024-01-16 MED ORDER — DEXAMETHASONE SOD PHOSPHATE PF 10 MG/ML IJ SOLN
10.0000 mg | Freq: Once | INTRAMUSCULAR | Status: AC
  Administered 2024-01-16: 10 mg via INTRAVENOUS

## 2024-01-16 MED ORDER — TRASTUZUMAB-ANNS CHEMO 150 MG IV SOLR
6.0000 mg/kg | Freq: Once | INTRAVENOUS | Status: AC
  Administered 2024-01-16: 420 mg via INTRAVENOUS
  Filled 2024-01-16: qty 20

## 2024-01-16 MED ORDER — SODIUM CHLORIDE 0.9 % IV SOLN
75.0000 mg/m2 | Freq: Once | INTRAVENOUS | Status: AC
  Administered 2024-01-16: 139 mg via INTRAVENOUS
  Filled 2024-01-16: qty 13.9

## 2024-01-16 MED ORDER — SODIUM CHLORIDE 0.9 % IV SOLN
INTRAVENOUS | Status: DC
  Filled 2024-01-16: qty 250

## 2024-01-16 NOTE — Progress Notes (Unsigned)
 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  INTERVAL HISTORY: Patient returns to clinic today for further evaluation and consideration of cycle 4 of Taxotere , carboplatin , Herceptin , and Perjeta .  She continues to have mild nausea and diarrhea for several days after treatment but otherwise feels well.  She has no neurologic complaints.  She denies any recent fevers or illnesses.  She has a good appetite and denies weight loss.  She denies any chest pain, shortness of breath, cough, or hemoptysis.  She has no urinary complaints.  Patient offers no further specific complaints today.  REVIEW OF SYSTEMS:   Review of Systems  Constitutional:  Positive for malaise/fatigue. Negative for fever and weight loss.  Respiratory: Negative.  Negative for cough, hemoptysis and shortness of breath.   Cardiovascular: Negative.  Negative for chest pain and leg swelling.  Gastrointestinal:  Positive for diarrhea and nausea.  Genitourinary: Negative.  Negative for dysuria.  Musculoskeletal: Negative.  Negative for back pain.  Skin: Negative.  Negative for rash.  Neurological: Negative.  Negative for dizziness, focal weakness, weakness and headaches.  Psychiatric/Behavioral: Negative.  The patient is not nervous/anxious.    As per HPI. Otherwise, a complete review of systems is negative.    PAST MEDICAL HISTORY: Past Medical History:  Diagnosis Date   Allergy    Penicillin, Aspirin   Anemia July 2023   Blood transfusion without reported diagnosis July 2023   Cancer [facility])    Goiter    Hypothyroid    Internal hemorrhoid, bleeding 12/2022   Iron  deficiency anemia     Pre-diabetes    Recurrent cold sores    Sinus tachycardia     PAST SURGICAL HISTORY: Past Surgical History:  Procedure Laterality Date   APPENDECTOMY     BREAST BIOPSY Right 05/21/2009   benign   BREAST BIOPSY Right 09/28/2023   US  RT BREAST BX W LOC DEV 1ST LESION IMG BX SPEC US  GUIDE 09/28/2023 [facility]   BREAST BIOPSY Right 09/28/2023   US  RT BREAST BX W LOC DEV EA ADD LESION IMG BX SPEC US  GUIDE 09/28/2023 [facility]   CHOLECYSTECTOMY N/A 02/28/2019   Procedure: LAPAROSCOPIC CHOLECYSTECTOMY;  Surgeon: Desiderio Schanz, MD;  Location: [facility];  Service: General;  Laterality: N/A;   COLONOSCOPY WITH PROPOFOL  N/A 09/14/2021   Procedure: COLONOSCOPY WITH PROPOFOL ;  Surgeon: Jinny Carmine, MD;  Location: [facility];  Service: Endoscopy;  Laterality: N/A;   DE QUERVAIN'S RELEASE Right 06/2022   ESOPHAGOGASTRODUODENOSCOPY (EGD) WITH PROPOFOL  N/A 02/27/2019   Procedure: ESOPHAGOGASTRODUODENOSCOPY (EGD) WITH PROPOFOL ;  Surgeon: Unk Corinn Skiff, MD;  Location: [facility] ENDOSCOPY;  Service: Gastroenterology;  Laterality: N/A;   ESOPHAGOGASTRODUODENOSCOPY (EGD) WITH PROPOFOL  N/A 09/14/2021   Procedure: ESOPHAGOGASTRODUODENOSCOPY (EGD) WITH PROPOFOL ;  Surgeon: Jinny Carmine, MD;  Location: [facility];  Service: Endoscopy;  Laterality: N/A;   GIVENS CAPSULE STUDY N/A 09/15/2021   Procedure: GIVENS CAPSULE STUDY;  Surgeon: Jinny Carmine, MD;  Location: [facility];  Service: Endoscopy;  Laterality: N/A;   HEMORRHOID BANDING  10/20/2022   HEMORRHOID SURGERY N/A 12/28/2022   Procedure: HEMORRHOIDECTOMY;  Surgeon: Rodolph Romano, MD;  Location: [facility];  Service: General;  Laterality: N/A;   KNEE ARTHROSCOPY Left 01/04/2007   torn meniscus  PORT A CATH REVISION Left 12/01/2023   Procedure: PORT A CATH REVISION;  Surgeon: Rodolph Romano, MD;  Location: [facility];  Service: General;  Laterality: Left;   PORTACATH PLACEMENT N/A 11/08/2023   Procedure: INSERTION,  TUNNELED CENTRAL VENOUS DEVICE, WITH PORT;  Surgeon: Rodolph Romano, MD;  Location: [facility];  Service: General;  Laterality: N/A;   VITRECTOMY Right 09/2022    FAMILY HISTORY: Family History  Problem Relation Age of Onset   Thyroid  disease Mother    Diabetes Mellitus II Mother    Diabetes Mother    Thyroid  disease Father    Coronary artery disease Father 89       CABG   Healthy Sister    Kidney cancer Maternal Grandmother    Lung cancer Maternal Grandfather        smoker   Hypothyroidism Daughter    Diabetes Son    Hypothyroidism Son    Diabetes Mellitus I Son    Colon cancer Neg Hx    Breast cancer Neg Hx     ADVANCED DIRECTIVES (Y/N):  N  HEALTH MAINTENANCE: Social History   Tobacco Use   Smoking status: Never   Smokeless tobacco: Never  Vaping Use   Vaping status: Never Used  Substance Use Topics   Alcohol use: Never   Drug use: Never     Colonoscopy:  PAP:  Bone density:  Lipid panel:  Allergies  Allergen Reactions   Aspirin Other (See Comments)    Childhood Allergy    Penicillin G Other (See Comments)    Childhood Allergy     Current Outpatient Medications  Medication Sig Dispense Refill   acetaminophen  (TYLENOL ) 500 MG tablet Take 1,000 mg by mouth every 6 (six) hours as needed (pain.).     dexamethasone  (DECADRON ) 4 MG tablet Take 2 tabs by mouth 2 times daily starting day before chemo. Then take 2 tabs daily for 2 days starting day after chemo. Take with food. 30 tablet 1   hydrocortisone  1 % ointment Apply sparingly 1-2 times a day for no more than 2 weeks at a time for chemotherapy induced rash. 30 g 0   ibuprofen  (ADVIL ) 200 MG tablet Take 400-600 mg by mouth every 8 (eight) hours as needed (pain.).     levothyroxine  (SYNTHROID ) 88 MCG tablet TAKE 1 TABLET BY MOUTH DAILY BEFORE BREAKFAST. 90 tablet 2   lidocaine -prilocaine  (EMLA ) cream Apply to affected area once 30 g 3   loperamide  (IMODIUM ) 2 MG capsule Take 1 capsule (2 mg total) by  mouth See admin instructions. Initial: 4 mg,the 2 mg every 2 hours (4 mg every 4 hours at night)  maximum: 16 mg/day 60 capsule 2   loratadine (CLARITIN) 10 MG tablet Take 10 mg by mouth daily.     metoprolol  tartrate (LOPRESSOR ) 25 MG tablet Take 0.5 tablets (12.5 mg total) by mouth 2 (two) times daily. 90 tablet 3   Multiple Vitamin (MULTIVITAMIN WITH MINERALS) TABS tablet Take 1 tablet by mouth in the morning.     ondansetron  (ZOFRAN ) 8 MG tablet Take 1 tablet (8 mg total) by mouth every 8 (eight) hours as needed for nausea or vomiting. Start on the third day after chemotherapy. 30 tablet 1   prochlorperazine  (COMPAZINE ) 10 MG tablet Take 1 tablet (10 mg total) by mouth every 6 (six) hours as needed for nausea or vomiting. 30 tablet 1   Psyllium (METAMUCIL PO) Take 3 capsules by mouth in the morning and at bedtime. 2  to  3 capules     No current facility-administered medications for this visit.    OBJECTIVE: Vitals:   01/16/24 0907  BP: (!) 131/91  Pulse: (!) 120  Resp: 18  Temp: 97.8 F (36.6 C)  SpO2: 98%     Body mass index is 23.81 kg/m.    ECOG FS:0 - Asymptomatic  General: Well-developed, well-nourished, no acute distress. Eyes: Pink conjunctiva, anicteric sclera. HEENT: Normocephalic, moist mucous membranes. Lungs: No audible wheezing or coughing. Heart: Regular rate and rhythm. Abdomen: Soft, nontender, no obvious distention. Musculoskeletal: No edema, cyanosis, or clubbing. Neuro: Alert, answering all questions appropriately. Cranial nerves grossly intact. Skin: No rashes or petechiae noted. Psych: Normal affect.  LAB RESULTS:  Lab Results  Component Value Date   NA 134 (L) 12/26/2023   K 3.6 12/26/2023   CL 103 12/26/2023   CO2 21 (L) 12/26/2023   GLUCOSE 185 (H) 12/26/2023   BUN 19 12/26/2023   CREATININE 0.71 12/26/2023   CALCIUM  8.9 12/26/2023   PROT 7.2 12/26/2023   ALBUMIN 3.5 12/26/2023   AST 35 12/26/2023   ALT 29 12/26/2023   ALKPHOS 45  12/26/2023   BILITOT 0.3 12/26/2023   GFRNONAA >60 12/26/2023   GFRAA 114 07/16/2019    Lab Results  Component Value Date   WBC 8.8 01/16/2024   NEUTROABS 5.1 01/16/2024   HGB 11.5 (L) 01/16/2024   HCT 34.0 (L) 01/16/2024   MCV 91.9 01/16/2024   PLT 194 01/16/2024     STUDIES: No results found.   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  Patient's most recent cardiac echo on December 14, 2023 reported an EF of 60 to 65%.  Plan to do 6 treatments and then refer back to surgery.  Return to clinic in 2 days for G-CSF support and then in 3 weeks for further evaluation and consideration of cycle 5. Rash: Resolved with steroids.  Monitor. Diarrhea: Mild.  Continue Imodium  as needed. Tachycardia: Chronic and unchanged.  Continue follow-up with cardiology as scheduled. Hyponatremia: Resolved. Anemia: Chronic and unchanged.  Patient's hemoglobin is 11.5 today.   Patient expressed understanding and was in agreement with this plan. She also understands that She can call clinic at any time with any questions, concerns, or complaints.    Cancer Staging  Invasive carcinoma of breast (HCC) Staging form: Breast, AJCC 8th Edition - Clinical stage from 10/13/2023: Stage IIA (cT2, cN0, cM0, G3, ER-, PR-, HER2+) - Signed by [name], MD on 10/24/2023 Stage prefix: Initial diagnosis Histologic grading system: 3 grade system   Evalene JINNY Reusing, MD   01/16/2024 9:18 AM

## 2024-01-16 NOTE — Progress Notes (Unsigned)
 Patient is having a tingling sensation in her right calf muscle, constat and just started about a week ago. She has the salty taste, making her not want to eat. Some constipation, taking imodium  for it though. Unable to sleep for the past night or two.

## 2024-01-16 NOTE — Patient Instructions (Signed)
 [facility] - A DEPT OF [facility]  Discharge Instructions: Thank you for choosing [facility] to provide your oncology and hematology care.  If you have a lab appointment with the Cancer Center, please go directly to the Cancer Center and check in at the registration area.  Wear comfortable clothing and clothing appropriate for easy access to any Portacath or PICC line.   We strive to give you quality time with your provider. You may need to reschedule your appointment if you arrive late (15 or more minutes).  Arriving late affects you and other patients whose appointments are after yours.  Also, if you miss three or more appointments without notifying the office, you may be dismissed from the clinic at the provider's discretion.      For prescription refill requests, have your pharmacy contact our office and allow 72 hours for refills to be completed.    Today you received the following chemotherapy and/or immunotherapy agents Kanjinti , Perjeta , Taxotere  and Carboplatin        To help prevent nausea and vomiting after your treatment, we encourage you to take your nausea medication as directed.  BELOW ARE SYMPTOMS THAT SHOULD BE REPORTED IMMEDIATELY: *FEVER GREATER THAN 100.4 F (38 C) OR HIGHER *CHILLS OR SWEATING *NAUSEA AND VOMITING THAT IS NOT CONTROLLED WITH YOUR NAUSEA MEDICATION *UNUSUAL SHORTNESS OF BREATH *UNUSUAL BRUISING OR BLEEDING *URINARY PROBLEMS (pain or burning when urinating, or frequent urination) *BOWEL PROBLEMS (unusual diarrhea, constipation, pain near the anus) TENDERNESS IN MOUTH AND THROAT WITH OR WITHOUT PRESENCE OF ULCERS (sore throat, sores in mouth, or a toothache) UNUSUAL RASH, SWELLING OR PAIN  UNUSUAL VAGINAL DISCHARGE OR ITCHING   Items with * indicate a potential emergency and should be followed up as soon as possible or go to the Emergency Department if any problems should occur.  Please show the  CHEMOTHERAPY ALERT CARD or IMMUNOTHERAPY ALERT CARD at check-in to the Emergency Department and triage nurse.  Should you have questions after your visit or need to cancel or reschedule your appointment, please contact [facility] - A DEPT OF [facility]  [phone] and follow the prompts.  Office hours are 8:00 a.m. to 4:30 p.m. Monday - Friday. Please note that voicemails left after 4:00 p.m. may not be returned until the following business day.  We are closed weekends and major holidays. You have access to a nurse at all times for urgent questions. Please call the main number to the clinic [phone] and follow the prompts.  For any non-urgent questions, you may also contact your provider using MyChart. We now offer e-Visits for anyone 67 and older to request care online for non-urgent symptoms. For details visit [email or web address].   Also download the MyChart app! Go to the app store, search MyChart, open the app, select [place], and log in with your MyChart username and password.

## 2024-01-17 ENCOUNTER — Encounter: Payer: Self-pay | Admitting: Oncology

## 2024-01-18 ENCOUNTER — Inpatient Hospital Stay

## 2024-01-18 ENCOUNTER — Ambulatory Visit

## 2024-01-18 DIAGNOSIS — Z5111 Encounter for antineoplastic chemotherapy: Secondary | ICD-10-CM | POA: Diagnosis not present

## 2024-01-18 DIAGNOSIS — C50919 Malignant neoplasm of unspecified site of unspecified female breast: Secondary | ICD-10-CM

## 2024-01-18 MED ORDER — PEGFILGRASTIM-JMDB 6 MG/0.6ML ~~LOC~~ SOSY
6.0000 mg | PREFILLED_SYRINGE | Freq: Once | SUBCUTANEOUS | Status: AC
  Administered 2024-01-18: 6 mg via SUBCUTANEOUS
  Filled 2024-01-18: qty 0.6

## 2024-01-22 ENCOUNTER — Inpatient Hospital Stay

## 2024-01-22 DIAGNOSIS — Z5111 Encounter for antineoplastic chemotherapy: Secondary | ICD-10-CM | POA: Diagnosis not present

## 2024-01-22 DIAGNOSIS — E860 Dehydration: Secondary | ICD-10-CM

## 2024-01-22 MED ORDER — SODIUM CHLORIDE 0.9 % IV SOLN
Freq: Once | INTRAVENOUS | Status: AC
  Filled 2024-01-22: qty 250

## 2024-01-24 ENCOUNTER — Encounter: Payer: Self-pay | Admitting: General Surgery

## 2024-01-25 ENCOUNTER — Telehealth: Payer: Self-pay

## 2024-01-25 MED ORDER — FLUCONAZOLE 100 MG PO TABS: 100.0000 mg | ORAL_TABLET | Freq: Every day | ORAL | 0 refills | Status: DC

## 2024-01-25 NOTE — Telephone Encounter (Signed)
 Dr. Jacobo approves sending Fluconazole today and [facility] tomorrow for the rash.  Patient can't use Magic Mouthwhash, thinks she is allergic.

## 2024-01-25 NOTE — Telephone Encounter (Signed)
 Please advise:  Patient has a horrible taste in mouth with white spots on tongue.  Also a decreased in appetite and the white spots on tongue make it difficult to eat.

## 2024-01-25 NOTE — Telephone Encounter (Signed)
 Patient made aware of Fluconazole rx.  She is now asking for steroid rx.  States that when she had same issue in the past Dr. Babara prescribed her steroid for a rash that she had on her face as well.  I asked if she had a rash, she sates yes on her nose that started this this week and getting worse.  Has been using topical cream with no relief.

## 2024-01-25 NOTE — Progress Notes (Signed)
 Cardiology Office Note    Date:  01/30/2024   ID:  Annette Phillips, DOB [birth year], MRN [id number]  PCP:  Myrla Jon HERO, MD  Cardiologist:  Lonni Hanson, MD  Electrophysiologist:  None   Chief Complaint: Follow up  History of Present Illness:   Annette Phillips is a 58 y.o. female with history of invasive ductal carcinoma of the right breast undergoing neoadjuvant chemotherapy, tachycardia, prediabetes, and anemia in the setting of hemorrhoidal bleeding who presents for follow-up of sinus tachycardia.  Echo in 10/2023 showed an EF of 65 to 70%, no regional wall motion abnormalities, grade 1 diastolic dysfunction, normal RV systolic function and ventricular cavity size, no significant valvular abnormality, and an estimated right atrial pressure of 3 mmHg.   She was referred to our office, and evaluated by Dr. Hanson on 12/13/2023 for elevated heart rates that were first noted around the time that she began chemotherapy in 10/2023.  She reported some increased fatigue over the preceding couple of months as well as rare epistaxis since beginning chemotherapy.  Heart rates were elevated in the office at 160 bpm with EKG felt to be representative of sinus tachycardia.  Given how profound her sinus tachycardia was it was recommended she proceed to the ER for further evaluation, however she refused.  Therefore labs were obtained as outlined below.  It was again recommended that she proceed to the ER.  She again declined.  She followed up with oncology and underwent IV infusion for possible dehydration.  Echo on 12/14/2023 showed an EF of 60 to 65%, no regional wall motion abnormalities, mild LVH, grade 1 diastolic dysfunction, normal RV systolic function, ventricular cavity size, and RVSP, mild mitral regurgitation, and an estimated right atrial pressure of 3 mmHg.  It was subsequently recommended that she start metoprolol  tartrate 12.5 mg twice daily and follow-up today.  She followed up in the office  in 12/2023 and had started metoprolol  12.5 mg the prior evening.  She was without symptoms of angina or cardiac decompensation.  She reported heart rates were typically in the 70s to 80s bpm at rest at home, trending into the 120s bpm with exertion.  She was continued on Lopressor  12.5 mg twice daily.  She comes in today accompanied by her daughter and is doing well from a cardiac perspective, without symptoms of angina or cardiac decompensation.  No dyspnea, palpitations, dizziness, recent MI, or syncope.  Heart rate trend reviewed with K[employer]ia mobile show heart rates ranging from 107 to 153 bpm.  She reports heart rate was not elevated at baseline prior to starting chemotherapy and has 2 more chemotherapy sessions scheduled moving forward followed by surgical resection.  BP stable.  Prior to the initiation of of chemotherapy/port placement she was very active without cardiac limitation.  Does not have any acute cardiac complaints at this time.   DASI: Greater than 4 METs without cardiac limitation RCRI: Low risk for noncardiac surgery   Labs independently reviewed: 01/2024 - Hgb 11.5, PLT 194, potassium 3.6, BUN 18, serum creatinine 0.7, albumin 3.9, AST/ALT normal 11/2023 - BNP 45, D-dimer 0.64, TSH 4.977, free T4 normal 07/2023 - TC 186, TG 448, HDL 35, LDL 79, A1c 5.8  Past Medical History:  Diagnosis Date   Allergy    Penicillin, Aspirin   Anemia July 2023   Blood transfusion without reported diagnosis July 2023   Cancer [facility])    Goiter    Hypothyroid    Internal hemorrhoid, bleeding 12/2022   Iron   deficiency anemia    Pre-diabetes    Recurrent cold sores    Sinus tachycardia     Past Surgical History:  Procedure Laterality Date   APPENDECTOMY     BREAST BIOPSY Right 05/21/2009   benign   BREAST BIOPSY Right 09/28/2023   US  RT BREAST BX W LOC DEV 1ST LESION IMG BX SPEC US  GUIDE 09/28/2023 [facility]   BREAST BIOPSY Right 09/28/2023   US  RT BREAST BX W LOC DEV EA ADD  LESION IMG BX SPEC US  GUIDE 09/28/2023 [facility]   CHOLECYSTECTOMY N/A 02/28/2019   Procedure: LAPAROSCOPIC CHOLECYSTECTOMY;  Surgeon: Desiderio Schanz, MD;  Location: [facility];  Service: General;  Laterality: N/A;   COLONOSCOPY WITH PROPOFOL  N/A 09/14/2021   Procedure: COLONOSCOPY WITH PROPOFOL ;  Surgeon: Jinny Carmine, MD;  Location: [facility]OSCOPY;  Service: Endoscopy;  Laterality: N/A;   DE QUERVAIN'S RELEASE Right 06/2022   ESOPHAGOGASTRODUODENOSCOPY (EGD) WITH PROPOFOL  N/A 02/27/2019   Procedure: ESOPHAGOGASTRODUODENOSCOPY (EGD) WITH PROPOFOL ;  Surgeon: Unk Corinn Skiff, MD;  Location: [facility] ENDOSCOPY;  Service: Gastroenterology;  Laterality: N/A;   ESOPHAGOGASTRODUODENOSCOPY (EGD) WITH PROPOFOL  N/A 09/14/2021   Procedure: ESOPHAGOGASTRODUODENOSCOPY (EGD) WITH PROPOFOL ;  Surgeon: Jinny Carmine, MD;  Location: [facility];  Service: Endoscopy;  Laterality: N/A;   GIVENS CAPSULE STUDY N/A 09/15/2021   Procedure: GIVENS CAPSULE STUDY;  Surgeon: Jinny Carmine, MD;  Location[facility] ENDOSCOPY;  Service: Endoscopy;  Laterality: N/A;   HEMORRHOID BANDING  10/20/2022   HEMORRHOID SURGERY N/A 12/28/2022   Procedure: HEMORRHOIDECTOMY;  Surgeon: Rodolph Romano, MD;  Location: [facility]S;  Service: General;  Laterality: N/A;   KNEE ARTHROSCOPY Left 01/04/2007   torn meniscus   PORT A CATH REVISION Left 12/01/2023   Procedure: PORT A CATH REVISION;  Surgeon: Rodolph Romano, MD;  Location: [facility];  Service: General;  Laterality: Left;   PORTACATH PLACEMENT N/A 11/08/2023   Procedure: INSERTION, TUNNELED CENTRAL VENOUS DEVICE, WITH PORT;  Surgeon: Rodolph Romano, MD;  Location: [facility]S;  Service: General;  Laterality: N/A;   VITRECTOMY Right 09/2022    Current Medications: Active Medications[1]  Allergies:   Aspirin and Penicillin g   Social History   Socioeconomic History   Marital status: Single    Spouse name: Not on file   Number of children: 3   Years of  education: Not on file   Highest education level: Bachelor's degree (e.g., BA, AB, BS)  Occupational History   Not on file  Tobacco Use   Smoking status: Never   Smokeless tobacco: Never  Vaping Use   Vaping status: Never Used  Substance and Sexual Activity   Alcohol use: Never   Drug use: Never   Sexual activity: Not Currently    Birth control/protection: Abstinence, Post-menopausal  Other Topics Concern   Not on file  Social History Narrative   Lives alone   Social Drivers of Health   Tobacco Use: Low Risk (01/30/2024)   Patient History    Smoking Tobacco Use: Never    Smokeless Tobacco Use: Never    Passive Exposure: Not on file  Financial Resource Strain: Low Risk (07/31/2023)   Overall Financial Resource Strain (CARDIA)    Difficulty of Paying Living Expenses: Not hard at all  Food Insecurity: No Food Insecurity (11/22/2023)   Epic    Worried About Radiation Protection Practitioner of Food in the Last Year: Never true    Ran Out of Food in the Last Year: Never true  Transportation Needs: No Transportation Needs (11/22/2023)   Epic  Lack of Transportation (Medical): No    Lack of Transportation (Non-Medical): No  Physical Activity: Sufficiently Active (07/31/2023)   Exercise Vital Sign    Days of Exercise per Week: 4 days    Minutes of Exercise per Session: 50 min  Stress: No Stress Concern Present (07/31/2023)   [employer] of Occupational Health - Occupational Stress Questionnaire    Feeling of Stress: Not at all  Social Connections: Moderately Integrated (11/22/2023)   Social Connection and Isolation Panel    Frequency of Communication with Friends and Family: More than three times a week    Frequency of Social Gatherings with Friends and Family: More than three times a week    Attends Religious Services: More than 4 times per year    Active Member of Clubs or Organizations: Yes    Attends Banker Meetings: More than 4 times per year    Marital Status: Divorced   Depression (PHQ2-9): Low Risk (01/30/2024)   Depression (PHQ2-9)    PHQ-2 Score: 0  Alcohol Screen: Low Risk (05/24/2022)   Alcohol Screen    Last Alcohol Screening Score (AUDIT): 0  Housing: Low Risk (11/22/2023)   Epic    Unable to Pay for Housing in the Last Year: No    Number of Times Moved in the Last Year: 0    Homeless in the Last Year: No  Utilities: Not At Risk (11/22/2023)   Epic    Threatened with loss of utilities: No  Health Literacy: Not on file     Family History:  The patient's family history includes Coronary artery disease (age of onset: 15) in her father; Diabetes in her mother and son; Diabetes Mellitus I in her son; Diabetes Mellitus II in her mother; Healthy in her sister; Hypothyroidism in her daughter and son; Kidney cancer in her maternal grandmother; Lung cancer in her maternal grandfather; Thyroid  disease in her father and mother. There is no history of Colon cancer or Breast cancer.  ROS:   12-point review of systems is negative unless otherwise noted in the HPI.   EKGs/Labs/Other Studies Reviewed:    Studies reviewed were summarized above. The additional studies were reviewed today:  2D echo 10/25/2023: 1. Left ventricular ejection fraction, by estimation, is 65 to 70%. The  left ventricle has normal function. The left ventricle has no regional  wall motion abnormalities. Left ventricular diastolic parameters are  consistent with Grade I diastolic  dysfunction (impaired relaxation). The global longitudinal strain is  normal.   2. Right ventricular systolic function is normal. The right ventricular  size is normal.   3. The mitral valve is normal in structure. No evidence of mitral valve  regurgitation. No evidence of mitral stenosis.   4. The aortic valve is normal in structure. Aortic valve regurgitation is  not visualized. No aortic stenosis is present.   5. The inferior vena cava is normal in size with greater than 50%  respiratory variability,  suggesting right atrial pressure of 3 mmHg.  __________   2D echo 12/14/2023: 1. Left ventricular ejection fraction, by estimation, is 60 to 65%. Left  ventricular ejection fraction by 3D volume is 84 %. Left ventricular  ejection fraction by PLAX is 55 %. The left ventricle has normal function.  The left ventricle has no regional  wall motion abnormalities. There is mild left ventricular hypertrophy.  Left ventricular diastolic parameters are consistent with Grade I  diastolic dysfunction (impaired relaxation). The average left ventricular  global longitudinal strain  is -11.4 %. The  global longitudinal strain is abnormal.   2. Right ventricular systolic function is normal. The right ventricular  size is normal. There is normal pulmonary artery systolic pressure. The  estimated right ventricular systolic pressure is 24.4 mmHg.   3. The mitral valve is normal in structure. Mild mitral valve  regurgitation. No evidence of mitral stenosis.   4. The aortic valve is normal in structure. Aortic valve regurgitation is  not visualized. No aortic stenosis is present.   5. The inferior vena cava is normal in size with greater than 50%  respiratory variability, suggesting right atrial pressure of 3 mmHg.    EKG:  EKG is not ordered today.  Rhythm strips from Kardia mobile device were reviewed showing sinus tachycardia with underlying artifact.  Recent Labs: 12/13/2023: B Natriuretic Peptide 45.6; TSH 4.977 01/16/2024: ALT 33; BUN 18; Creatinine 0.70; Hemoglobin 11.5; Platelet Count 194; Potassium 3.6; Sodium 136  Recent Lipid Panel    Component Value Date/Time   CHOL 186 08/01/2023 0000   TRIG 448 (H) 08/01/2023 0000   HDL 35 (L) 08/01/2023 0000   CHOLHDL 4.0 07/16/2019 0945   LDLCALC 79 08/01/2023 0000    PHYSICAL EXAM:    VS:  BP 120/80 (BP Location: Left Arm, Patient Position: Sitting, Cuff Size: Normal)   Pulse (!) 140   Ht 5' 7 (1.702 m)   Wt 151 lb 3.2 oz (68.6 kg)   SpO2 98%    BMI 23.68 kg/m   BMI: Body mass index is 23.68 kg/m.  Physical Exam Vitals reviewed.  Constitutional:      Appearance: She is well-developed.  HENT:     Head: Normocephalic and atraumatic.  Eyes:     General:        Right eye: No discharge.        Left eye: No discharge.  Cardiovascular:     Rate and Rhythm: Regular rhythm. Tachycardia present.     Heart sounds: Normal heart sounds, S1 normal and S2 normal. Heart sounds not distant. No midsystolic click and no opening snap. No murmur heard.    No friction rub.  Pulmonary:     Effort: Pulmonary effort is normal. No respiratory distress.     Breath sounds: Normal breath sounds. No decreased breath sounds, wheezing, rhonchi or rales.  Musculoskeletal:     Cervical back: Normal range of motion.  Skin:    General: Skin is warm and dry.     Nails: There is no clubbing.  Neurological:     Mental Status: She is alert and oriented to person, place, and time.  Psychiatric:        Speech: Speech normal.        Behavior: Behavior normal.        Thought Content: Thought content normal.        Judgment: Judgment normal.     Wt Readings from Last 3 Encounters:  01/30/24 151 lb 3.2 oz (68.6 kg)  01/30/24 152 lb (68.9 kg)  01/16/24 152 lb (68.9 kg)     ASSESSMENT & PLAN:   Sinus tachycardia: Appears to be exacerbated by/in the setting of chemotherapy.  She is completely asymptomatic.  As previously declined ED evaluation.  Titrate Lopressor  to 25 mg twice daily with close monitoring of heart rates, particularly upon completion of chemotherapy.  Continue to monitor heart rate with Kardia mobile device.  Preoperative cardiac risk stratification: Following completion of chemotherapy she will undergo surgical resection of breast cancer.  Prior to the initiation of chemotherapy she was very active and able to achieve greater than 4 METS without cardiac limitation.  Echo has demonstrated preserved LV systolic function without significant  structural abnormality.  She is low risk for noncardiac surgery and may proceed without further cardiac testing.   Disposition: F/u with Dr. Mady or an APP in 3-4 months.   Medication Adjustments/Labs and Tests Ordered: Current medicines are reviewed at length with the patient today.  Concerns regarding medicines are outlined above. Medication changes, Labs and Tests ordered today are summarized above and listed in the Patient Instructions accessible in Encounters.   Signed, Bernardino Bring, PA-C 01/30/2024 5:51 PM     [facility] [place] [address] [place], [place] [address] [phone]      [1]  Current Meds  Medication Sig   acetaminophen  (TYLENOL ) 500 MG tablet Take 1,000 mg by mouth every 6 (six) hours as needed (pain.).   dexamethasone  (DECADRON ) 4 MG tablet Take 2 tabs by mouth 2 times daily starting day before chemo. Then take 2 tabs daily for 2 days starting day after chemo. Take with food.   doxepin  (SINEQUAN ) 10 MG/ML solution Take 2.5 ml [25mg ] in 5 ml of water ; Oral rinse for 1 minute then spit. Repeat 3-6 times a day.   fluconazole  (DIFLUCAN ) 100 MG tablet Take 1 tablet (100 mg total) by mouth daily.   hydrocortisone  1 % ointment Apply sparingly 1-2 times a day for no more than 2 weeks at a time for chemotherapy induced rash.   ibuprofen  (ADVIL ) 200 MG tablet Take 400-600 mg by mouth every 8 (eight) hours as needed (pain.).   levothyroxine  (SYNTHROID ) 88 MCG tablet TAKE 1 TABLET BY MOUTH DAILY BEFORE BREAKFAST.   lidocaine -prilocaine  (EMLA ) cream Apply to affected area once   loperamide  (IMODIUM ) 2 MG capsule Take 1 capsule (2 mg total) by mouth See admin instructions. Initial: 4 mg,the 2 mg every 2 hours (4 mg every 4 hours at night)  maximum: 16 mg/day   loratadine (CLARITIN) 10 MG tablet Take 10 mg by mouth daily.   Multiple Vitamin (MULTIVITAMIN WITH MINERALS) TABS tablet Take 1 tablet by mouth in the morning.   ondansetron  (ZOFRAN ) 8 MG  tablet Take 1 tablet (8 mg total) by mouth every 8 (eight) hours as needed for nausea or vomiting. Start on the third day after chemotherapy.   prochlorperazine  (COMPAZINE ) 10 MG tablet Take 1 tablet (10 mg total) by mouth every 6 (six) hours as needed for nausea or vomiting.   Psyllium (METAMUCIL PO) Take 3 capsules by mouth in the morning and at bedtime. 2  to 3 capules   triamcinolone  cream (KENALOG ) 0.1 % Apply 1 Application topically 2 (two) times daily.   valACYclovir  (VALTREX ) 500 MG tablet Take 2 tablets (1000 mg total) by mouth 2 times a day at first signs of outbreak and continue for 7 days. Reduce to 2 tablets (1000 mg) by mouth once a day for suppression.   [DISCONTINUED] metoprolol  tartrate (LOPRESSOR ) 25 MG tablet Take 0.5 tablets (12.5 mg total) by mouth 2 (two) times daily.

## 2024-01-26 ENCOUNTER — Inpatient Hospital Stay (HOSPITAL_BASED_OUTPATIENT_CLINIC_OR_DEPARTMENT_OTHER): Admitting: Nurse Practitioner

## 2024-01-26 ENCOUNTER — Encounter: Payer: Self-pay | Admitting: Oncology

## 2024-01-26 DIAGNOSIS — R21 Rash and other nonspecific skin eruption: Secondary | ICD-10-CM

## 2024-01-26 DIAGNOSIS — C50911 Malignant neoplasm of unspecified site of right female breast: Secondary | ICD-10-CM | POA: Diagnosis not present

## 2024-01-26 DIAGNOSIS — Z1731 Human epidermal growth factor receptor 2 positive status: Secondary | ICD-10-CM | POA: Diagnosis not present

## 2024-01-26 DIAGNOSIS — Z1722 Progesterone receptor negative status: Secondary | ICD-10-CM

## 2024-01-26 DIAGNOSIS — B370 Candidal stomatitis: Secondary | ICD-10-CM

## 2024-01-26 DIAGNOSIS — Z171 Estrogen receptor negative status [ER-]: Secondary | ICD-10-CM | POA: Diagnosis not present

## 2024-01-26 MED ORDER — TRIAMCINOLONE ACETONIDE 0.1 % EX CREA: 1.0000 | TOPICAL_CREAM | Freq: Two times a day (BID) | CUTANEOUS | 0 refills | Status: AC

## 2024-01-26 MED ORDER — FLUCONAZOLE 100 MG PO TABS: 100.0000 mg | ORAL_TABLET | Freq: Every day | ORAL | 0 refills | Status: AC

## 2024-01-26 NOTE — Telephone Encounter (Signed)
 Patient will have virtual [facility] [place] today at 2:15.

## 2024-01-26 NOTE — Progress Notes (Signed)
 [facility]  [phone] [phone]  ID: Annette Phillips OB: 12/16/1965  MR#: [id number]  RDW#:[id number]  Patient Care Team: Myrla Jon HERO, MD as PCP - General (Family Medicine) End, Lonni, MD as PCP - Cardiology (Cardiology) Jacobo Evalene PARAS, MD as Consulting Physician (Oncology)  CHIEF COMPLAINT: Virtual [facility] visit with complaints of rash on face and oral thrush      Virtual Visit Progress Note   I connected with Annette Phillips on 01/26/24 at  2:15 PM EDT by video enabled telemedicine visit and verified that I am speaking with the correct person using two identifiers.    I discussed the limitations, risks, security and privacy concerns of performing an evaluation and management service by telemedicine and the availability of in-person appointments. I also discussed with the patient that there may be a patient responsible charge related to this service. The patient expressed understanding and agreed to proceed.    Other persons participating in the visit and their role in the encounter: RN/medical reconciliation   Patients location: home Providers location: office      INTERVAL HISTORY: Patient reports red raised rash on face that started about 3 days ago.  She reports having something similar in the past that responded to steroids.  However patient also currently being treated for oral thrush with fluconazole  as well.  She reports that she has had 1 dose of fluconazole  and she continues to have a sore throat but feels some improvement today.  She reports trying hydrocortisone  cream on her face without relief.  I explained to her that without be seeing her throat, and her hx of thrush steroids could make thrush worse.  We will try triamcinolone  cream on face today for 5 days.  Instructed her to call [facility] clinic back in no improvement over the weekend for an in person visit.   REVIEW OF SYSTEMS:   Review of Systems  Constitutional:   Positive for malaise/fatigue. Negative for fever and weight loss.  HENT:  Positive for sore throat.        Patient reports white patches on back of tongue  Respiratory: Negative.  Negative for cough, hemoptysis and shortness of breath.   Cardiovascular: Negative.  Negative for chest pain and leg swelling.  Gastrointestinal:  Negative for diarrhea and nausea.  Genitourinary: Negative.  Negative for dysuria.  Musculoskeletal: Negative.  Negative for back pain.  Skin:  Positive for rash.       Raised red scattered rash seen virtually on cheeks and forehead  Neurological: Negative.  Negative for dizziness, focal weakness, weakness and headaches.  Psychiatric/Behavioral: Negative.  The patient is not nervous/anxious.    As per HPI. Otherwise, a complete review of systems is negative.    PAST MEDICAL HISTORY: Past Medical History:  Diagnosis Date   Allergy    Penicillin, Aspirin   Anemia July 2023   Blood transfusion without reported diagnosis July 2023   Cancer [facility])    Goiter    Hypothyroid    Internal hemorrhoid, bleeding 12/2022   Iron  deficiency anemia    Pre-diabetes    Recurrent cold sores    Sinus tachycardia     PAST SURGICAL HISTORY: Past Surgical History:  Procedure Laterality Date   APPENDECTOMY     BREAST BIOPSY Right 05/21/2009   benign   BREAST BIOPSY Right 09/28/2023   US  RT BREAST BX W LOC DEV 1ST LESION IMG BX SPEC US  GUIDE 09/28/2023 [facility]   BREAST BIOPSY Right 09/28/2023  US  RT BREAST BX W LOC DEV EA ADD LESION IMG BX SPEC US  GUIDE 09/28/2023 [facility]   CHOLECYSTECTOMY N/A 02/28/2019   Procedure: LAPAROSCOPIC CHOLECYSTECTOMY;  Surgeon: Desiderio Schanz, MD;  Location: [facility];  Service: General;  Laterality: N/A;   COLONOSCOPY WITH PROPOFOL  N/A 09/14/2021   Procedure: COLONOSCOPY WITH PROPOFOL ;  Surgeon: Jinny Carmine, MD;  Location: [facility]OS[facility];  Service: Endoscopy;  Laterality: N/A;   DE QUERVAIN'S RELEASE Right 06/2022    ESOPHAGOGASTRODUODENOSCOPY (EGD) WITH PROPOFOL  N/A 02/27/2019   Procedure: ESOPHAGOGASTRODUODENOSCOPY (EGD) WITH PROPOFOL ;  Surgeon: Unk Corinn Skiff, MD;  Location: [facility] ENDOSCOPY;  Service: Gastroenterology;  Laterality: N/A;   ESOPHAGOGASTRODUODENOSCOPY (EGD) WITH PROPOFOL  N/A 09/14/2021   Procedure: ESOPHAGOGASTRODUODENOSCOPY (EGD) WITH PROPOFOL ;  Surgeon: Jinny Carmine, MD;  Location: [facility] ENDOSCOPY;  Service: Endoscopy;  Laterality: N/A;   GIVENS CAPSULE STUDY N/A 09/15/2021   Procedure: GIVENS CAPSULE STUDY;  Surgeon: Jinny Carmine, MD;  Location: [facility]COPY;  Service: Endoscopy;  Laterality: N/A;   HEMORRHOID BANDING  10/20/2022   HEMORRHOID SURGERY N/A 12/28/2022   Procedure: HEMORRHOIDECTOMY;  Surgeon: Rodolph Romano, MD;  Location: [facility];  Service: General;  Laterality: N/A;   KNEE ARTHROSCOPY Left 01/04/2007   torn meniscus   PORT A CATH REVISION Left 12/01/2023   Procedure: PORT A CATH REVISION;  Surgeon: Rodolph Romano, MD;  Location: [facility];  Service: General;  Laterality: Left;   PORTACATH PLACEMENT N/A 11/08/2023   Procedure: INSERTION, TUNNELED CENTRAL VENOUS DEVICE, WITH PORT;  Surgeon: Rodolph Romano, MD;  Location: [facility];  Service: General;  Laterality: N/A;   VITRECTOMY Right 09/2022    FAMILY HISTORY: Family History  Problem Relation Age of Onset   Thyroid  disease Mother    Diabetes Mellitus II Mother    Diabetes Mother    Thyroid  disease Father    Coronary artery disease Father 23       CABG   Healthy Sister    Kidney cancer Maternal Grandmother    Lung cancer Maternal Grandfather        smoker   Hypothyroidism Daughter    Diabetes Son    Hypothyroidism Son    Diabetes Mellitus I Son    Colon cancer Neg Hx    Breast cancer Neg Hx     ADVANCED DIRECTIVES (Y/N):  N  HEALTH MAINTENANCE: Social History   Tobacco Use   Smoking status: Never   Smokeless tobacco: Never  Vaping Use   Vaping status: Never Used  Substance  Use Topics   Alcohol use: Never   Drug use: Never     Colonoscopy:  PAP:  Bone density:  Lipid panel:  Allergies  Allergen Reactions   Aspirin Other (See Comments)    Childhood Allergy    Penicillin G Other (See Comments)    Childhood Allergy     Current Outpatient Medications  Medication Sig Dispense Refill   fluconazole  (DIFLUCAN ) 100 MG tablet Take 1 tablet (100 mg total) by mouth daily for 2 days. 2 tablet 0   triamcinolone  cream (KENALOG ) 0.1 % Apply 1 Application topically 2 (two) times daily. 30 g 0   acetaminophen  (TYLENOL ) 500 MG tablet Take 1,000 mg by mouth every 6 (six) hours as needed (pain.).     dexamethasone  (DECADRON ) 4 MG tablet Take 2 tabs by mouth 2 times daily starting day before chemo. Then take 2 tabs daily for 2 days starting day after chemo. Take with food. 30 tablet 1   fluconazole  (DIFLUCAN ) 100 MG tablet Take 1 tablet (  100 mg total) by mouth daily. 3 tablet 0   hydrocortisone  1 % ointment Apply sparingly 1-2 times a day for no more than 2 weeks at a time for chemotherapy induced rash. 30 g 0   ibuprofen  (ADVIL ) 200 MG tablet Take 400-600 mg by mouth every 8 (eight) hours as needed (pain.).     levothyroxine  (SYNTHROID ) 88 MCG tablet TAKE 1 TABLET BY MOUTH DAILY BEFORE BREAKFAST. 90 tablet 2   lidocaine -prilocaine  (EMLA ) cream Apply to affected area once 30 g 3   loperamide  (IMODIUM ) 2 MG capsule Take 1 capsule (2 mg total) by mouth See admin instructions. Initial: 4 mg,the 2 mg every 2 hours (4 mg every 4 hours at night)  maximum: 16 mg/day 60 capsule 2   loratadine (CLARITIN) 10 MG tablet Take 10 mg by mouth daily.     metoprolol  tartrate (LOPRESSOR ) 25 MG tablet Take 0.5 tablets (12.5 mg total) by mouth 2 (two) times daily. 90 tablet 3   Multiple Vitamin (MULTIVITAMIN WITH MINERALS) TABS tablet Take 1 tablet by mouth in the morning.     ondansetron  (ZOFRAN ) 8 MG tablet Take 1 tablet (8 mg total) by mouth every 8 (eight) hours as needed for nausea or  vomiting. Start on the third day after chemotherapy. 30 tablet 1   prochlorperazine  (COMPAZINE ) 10 MG tablet Take 1 tablet (10 mg total) by mouth every 6 (six) hours as needed for nausea or vomiting. 30 tablet 1   Psyllium (METAMUCIL PO) Take 3 capsules by mouth in the morning and at bedtime. 2  to 3 capules     No current facility-administered medications for this visit.    OBJECTIVE: There were no vitals filed for this visit.    There is no height or weight on file to calculate BMI.    ECOG FS:0 - Asymptomatic  General: Well-developed, well-nourished, no acute distress. Eyes: Pink conjunctiva, anicteric sclera. HEENT: Normocephalic, moist mucous membranes. Lungs: No audible wheezing or coughing. Heart: Regular rate and rhythm. Abdomen: Soft, nontender, no obvious distention. Musculoskeletal: No edema, cyanosis, or clubbing. Neuro: Alert, answering all questions appropriately. Cranial nerves grossly intact. Skin: No rashes or petechiae noted. Psych: Normal affect.  LAB RESULTS:  Lab Results  Component Value Date   NA 136 01/16/2024   K 3.6 01/16/2024   CL 104 01/16/2024   CO2 23 01/16/2024   GLUCOSE 105 (H) 01/16/2024   BUN 18 01/16/2024   CREATININE 0.70 01/16/2024   CALCIUM  9.6 01/16/2024   PROT 7.4 01/16/2024   ALBUMIN 3.9 01/16/2024   AST 27 01/16/2024   ALT 33 01/16/2024   ALKPHOS 45 01/16/2024   BILITOT 0.5 01/16/2024   GFRNONAA >60 01/16/2024   GFRAA 114 07/16/2019    Lab Results  Component Value Date   WBC 8.8 01/16/2024   NEUTROABS 5.1 01/16/2024   HGB 11.5 (L) 01/16/2024   HCT 34.0 (L) 01/16/2024   MCV 91.9 01/16/2024   PLT 194 01/16/2024     STUDIES: No results found.   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  PLAN:    Oral Thrush: Dr. Jacobo had started her on oral fluconazole  she reports some improvement with fluconazole  today continues to have sore throat extended fluconazole   for 2 more days  Rash on face worse on cheeks and forehead: in past resolved with steroids.  Ordered triamcinolone  cream for 5 days   Follow up plan: As scheduled and as needed LP    Cancer  Staging  Invasive carcinoma of breast (HCC) Staging form: Breast, AJCC 8th Edition - Clinical stage from 10/13/2023: Stage IIA (cT2, cN0, cM0, G3, ER-, PR-, HER2+) - Signed by [name], MD on 10/24/2023 Stage prefix: Initial diagnosis Histologic grading system: 3 grade system   Morna Husband, NP   01/26/2024 4:06 PM

## 2024-01-30 ENCOUNTER — Inpatient Hospital Stay: Admitting: Nurse Practitioner

## 2024-01-30 ENCOUNTER — Encounter: Payer: Self-pay | Admitting: Nurse Practitioner

## 2024-01-30 ENCOUNTER — Telehealth: Payer: Self-pay | Admitting: *Deleted

## 2024-01-30 ENCOUNTER — Encounter: Payer: Self-pay | Admitting: Oncology

## 2024-01-30 ENCOUNTER — Ambulatory Visit: Attending: Physician Assistant | Admitting: Physician Assistant

## 2024-01-30 ENCOUNTER — Encounter: Payer: Self-pay | Admitting: Physician Assistant

## 2024-01-30 VITALS — BP 128/91 | HR 127 | Temp 97.8°F | Resp 16 | Wt 152.0 lb

## 2024-01-30 VITALS — BP 120/80 | HR 140 | Ht 67.0 in | Wt 151.2 lb

## 2024-01-30 DIAGNOSIS — Z5111 Encounter for antineoplastic chemotherapy: Secondary | ICD-10-CM | POA: Diagnosis not present

## 2024-01-30 DIAGNOSIS — Z01810 Encounter for preprocedural cardiovascular examination: Secondary | ICD-10-CM | POA: Diagnosis not present

## 2024-01-30 DIAGNOSIS — K1231 Oral mucositis (ulcerative) due to antineoplastic therapy: Secondary | ICD-10-CM | POA: Diagnosis not present

## 2024-01-30 DIAGNOSIS — R000 Tachycardia, unspecified: Secondary | ICD-10-CM | POA: Diagnosis not present

## 2024-01-30 MED ORDER — DOXEPIN HCL 10 MG/ML PO CONC: ORAL | Status: DC

## 2024-01-30 MED ORDER — VALACYCLOVIR HCL 500 MG PO TABS: ORAL_TABLET | ORAL | 0 refills | Status: AC

## 2024-01-30 MED ORDER — METOPROLOL TARTRATE 25 MG PO TABS: 25.0000 mg | ORAL_TABLET | Freq: Two times a day (BID) | ORAL | 3 refills | Status: AC

## 2024-01-30 NOTE — Patient Instructions (Signed)
 Medication Instructions:  Your physician recommends the following medication changes.  INCREASE: Lopressor  25 mg twice daily   *If you need a refill on your cardiac medications before your next appointment, please call your pharmacy*  Lab Work: None ordered at this time   Follow-Up: At [facility], you and your health needs are our priority.  As part of our continuing mission to provide you with exceptional heart care, our providers are all part of one team.  This team includes your primary Cardiologist (physician) and Advanced Practice Providers or APPs (Physician Assistants and Nurse Practitioners) who all work together to provide you with the care you need, when you need it.  Your next appointment:   3-4 month(s)  Provider:   You may see Lonni Hanson, MD or Bernardino Bring, PA-C

## 2024-01-30 NOTE — Progress Notes (Signed)
 Symptom Management Clinic  [facility] at [facility] A Department of the [place] [address] [place], [place] [address] [phone] (phone) [phone] (fax)  Patient Care Team: Myrla Jon HERO, MD as PCP - General (Family Medicine) End, Lonni, MD as PCP - Cardiology (Cardiology) Jacobo Evalene PARAS, MD as Consulting Physician (Oncology)   Name of the patient: Annette Phillips  [id number]  05-09-1965   Date of visit: 01/30/2024  Diagnosis- Breast Cancer  Chief complaint/ Reason for visit- Mucositis  Heme/Onc history:  Oncology History  Invasive carcinoma of breast (HCC)  09/18/2023 Mammogram   Bilateral screening mammogram  In the right breast, possible asymmetries warrant further evaluation. In the left breast, no findings suspicious for malignancy.   09/22/2023 Mammogram   Right breast diagnostic mammogram   1. There is a highly suspicious 10 mm mass in the RIGHT upper inner breast. Recommend ultrasound-guided biopsy for definitive characterization. 2. There is a sonographically identified additional 6 mm mass in the RIGHT upper inner breast which is indeterminate. Recommend ultrasound-guided biopsy for definitive characterization. 3. No suspicious RIGHT axillary adenopathy.   09/28/2023 Initial Diagnosis   Invasive carcinoma of breast (HCC)  S/p right breast biopsy.   1. Breast, right, needle core biopsy, 2:00 6cmfn 10mm (ribbon clip) - INVASIVE DUCTAL CARCINOMA, SEE NOTE - TUBULE FORMATION: SCORE 3 - NUCLEAR PLEOMORPHISM: SCORE 3 - MITOTIC COUNT: SCORE 3 - TOTAL SCORE: 9 - OVERALL GRADE: 3 - LYMPHOVASCULAR INVASION: NOT IDENTIFIED - CANCER LENGTH: 0.7 CM - CALCIFICATIONS: NOT IDENTIFIED - OTHER FINDINGS: NONE 2. Breast, right, needle core biopsy, 1:00 3cmfn 6mm (venus clip) - INVASIVE DUCTAL CARCINOMA, SEE NOTE - TUBULE FORMATION: SCORE 3 - NUCLEAR PLEOMORPHISM: SCORE 3 - MITOTIC COUNT:  SCORE 3 - TOTAL SCORE: 9 - OVERALL GRADE: 3 - LYMPHOVASCULAR INVASION: NOT IDENTIFIED - CANCER LENGTH: 0.5 CM - CALCIFICATIONS: NOT IDENTIFIED - OTHER FINDINGS: NONE  1. 1) Breast, right, needle core biopsy, 2:00 6 cmfn 10mm ( ribbon clip) PROGNOSTIC INDICATORS Results: IMMUNOHISTOCHEMICAL AND MORPHOMETRIC ANALYSIS PERFORMED MANUALLY The tumor cells are POSITIVE for Her2 (3+). Estrogen Receptor: 0%, NEGATIVE Progesterone Receptor: 0%, NEGATIVE Proliferation Marker Ki67: 50%     10/13/2023 Cancer Staging   Staging form: Breast, AJCC 8th Edition - Clinical stage from 10/13/2023: Stage IIA (cT2, cN0, cM0, G3, ER-, PR-, HER2+) - Signed by [name], MD on 10/24/2023 Stage prefix: Initial diagnosis Histologic grading system: 3 grade system   10/20/2023 Imaging   Bilateral MRI breast with and without contrast showed 1. Two sites of UPPER-OUTER RIGHT breast biopsy-proven malignancy spanning a total distance of 4.2 cm (see above). 2. No other MR evidence of malignancy within either breast. No abnormal appearing lymph nodes.   11/14/2023 -  Chemotherapy   Patient is on Treatment Plan : BREAST  Docetaxel  + Carboplatin  + Trastuzumab  + Pertuzumab   (TCHP) q21d        Interval history-Annette Phillips is a 58 year old female with above history of breast cancer currently status post cycle 4 docetaxel -carboplatin -Perjeta -Herceptin  chemotherapy with Dr. Jacobo again on 01/16/2024.  She presents today for evaluation of mouth sores.  Was evaluated in symptom management previously for similar symptoms.  She was treated with diflucan . Symptoms have persisted. She also has a rash that is pruritic. Treated with triamcinolone  and somewhat improved. Mouth sores are most bothersome. She has a history of cold sores but has not required treatment in the past.   Review of systems- Review of Systems  Constitutional:  Negative for chills, fever, malaise/fatigue and weight loss.  HENT:  Positive for sore throat.  Negative for hearing loss, nosebleeds and tinnitus.   Eyes:  Negative for blurred vision and double vision.  Respiratory:  Negative for cough, hemoptysis, shortness of breath and wheezing.   Cardiovascular:  Negative for chest pain, palpitations and leg swelling.  Gastrointestinal:  Negative for abdominal pain, blood in stool, constipation, diarrhea, melena, nausea and vomiting.  Genitourinary:  Negative for dysuria and urgency.  Musculoskeletal:  Negative for back pain, falls, joint pain and myalgias.  Skin:  Positive for itching and rash.  Neurological:  Negative for dizziness, tingling, sensory change, loss of consciousness, weakness and headaches.  Endo/Heme/Allergies:  Negative for environmental allergies. Does not bruise/bleed easily.  Psychiatric/Behavioral:  Negative for depression. The patient is not nervous/anxious and does not have insomnia.     Allergies[1]  Past Medical History:  Diagnosis Date   Allergy    Penicillin, Aspirin   Anemia July 2023   Blood transfusion without reported diagnosis July 2023   Cancer [facility])    Goiter    Hypothyroid    Internal hemorrhoid, bleeding 12/2022   Iron  deficiency anemia    Pre-diabetes    Recurrent cold sores    Sinus tachycardia     Past Surgical History:  Procedure Laterality Date   APPENDECTOMY     BREAST BIOPSY Right 05/21/2009   benign   BREAST BIOPSY Right 09/28/2023   US  RT BREAST BX W LOC DEV 1ST LESION IMG BX SPEC US  GUIDE 09/28/2023 [facility]   BREAST BIOPSY Right 09/28/2023   US  RT BREAST BX W LOC DEV EA ADD LESION IMG BX SPEC US  GUIDE 09/28/2023 [facility]   CHOLECYSTECTOMY N/A 02/28/2019   Procedure: LAPAROSCOPIC CHOLECYSTECTOMY;  Surgeon: Desiderio Schanz, MD;  Location: [facility];  Service: General;  Laterality: N/A;   COLONOSCOPY WITH PROPOFOL  N/A 09/14/2021   Procedure: COLONOSCOPY WITH PROPOFOL ;  Surgeon: Jinny Carmine, MD;  Location: [facility] ENDOSCOPY;  Service: Endoscopy;  Laterality: N/A;   DE QUERVAIN'S  RELEASE Right 06/2022   ESOPHAGOGASTRODUODENOSCOPY (EGD) WITH PROPOFOL  N/A 02/27/2019   Procedure: ESOPHAGOGASTRODUODENOSCOPY (EGD) WITH PROPOFOL ;  Surgeon: Unk Corinn Skiff, MD;  Location: [facility] ENDOSCOPY;  Service: Gastroenterology;  Laterality: N/A;   ESOPHAGOGASTRODUODENOSCOPY (EGD) WITH PROPOFOL  N/A 09/14/2021   Procedure: ESOPHAGOGASTRODUODENOSCOPY (EGD) WITH PROPOFOL ;  Surgeon: Jinny Carmine, MD;  Location: [facility] ENDOSCOPY;  Service: Endoscopy;  Laterality: N/A;   GIVENS CAPSULE STUDY N/A 09/15/2021   Procedure: GIVENS CAPSULE STUDY;  Surgeon: Jinny Carmine, MD;  Location: [facility] ENDOSCOPY;  Service: Endoscopy;  Laterality: N/A;   HEMORRHOID BANDING  10/20/2022   HEMORRHOID SURGERY N/A 12/28/2022   Procedure: HEMORRHOIDECTOMY;  Surgeon: Rodolph Romano, MD;  Location: [facility] ORS;  Service: General;  Laterality: N/A;   KNEE ARTHROSCOPY Left 01/04/2007   torn meniscus   PORT A CATH REVISION Left 12/01/2023   Procedure: PORT A CATH REVISION;  Surgeon: Rodolph Romano, MD;  Location: [facility] ORS;  Service: General;  Laterality: Left;   PORTACATH PLACEMENT N/A 11/08/2023   Procedure: INSERTION, TUNNELED CENTRAL VENOUS DEVICE, WITH PORT;  Surgeon: Rodolph Romano, MD;  Location: [facility] ORS;  Service: General;  Laterality: N/A;   VITRECTOMY Right 09/2022    Social History   Socioeconomic History   Marital status: Single    Spouse name: Not on file   Number of children: 3   Years of education: Not on file   Highest education level: Bachelor's degree (e.g., BA, AB, BS)  Occupational History  Not on file  Tobacco Use   Smoking status: Never   Smokeless tobacco: Never  Vaping Use   Vaping status: Never Used  Substance and Sexual Activity   Alcohol use: Never   Drug use: Never   Sexual activity: Not Currently    Birth control/protection: Abstinence, Post-menopausal  Other Topics Concern   Not on file  Social History Narrative   Lives alone   Social Drivers of Health    Tobacco Use: Low Risk (01/30/2024)   Patient History    Smoking Tobacco Use: Never    Smokeless Tobacco Use: Never    Passive Exposure: Not on file  Financial Resource Strain: Low Risk (07/31/2023)   Overall Financial Resource Strain (CARDIA)    Difficulty of Paying Living Expenses: Not hard at all  Food Insecurity: No Food Insecurity (11/22/2023)   Epic    Worried About Radiation Protection Practitioner of Food in the Last Year: Never true    Ran Out of Food in the Last Year: Never true  Transportation Needs: No Transportation Needs (11/22/2023)   Epic    Lack of Transportation (Medical): No    Lack of Transportation (Non-Medical): No  Physical Activity: Sufficiently Active (07/31/2023)   Exercise Vital Sign    Days of Exercise per Week: 4 days    Minutes of Exercise per Session: 50 min  Stress: No Stress Concern Present (07/31/2023)   [employer] of Occupational Health - Occupational Stress Questionnaire    Feeling of Stress: Not at all  Social Connections: Moderately Integrated (11/22/2023)   Social Connection and Isolation Panel    Frequency of Communication with Friends and Family: More than three times a week    Frequency of Social Gatherings with Friends and Family: More than three times a week    Attends Religious Services: More than 4 times per year    Active Member of Clubs or Organizations: Yes    Attends Banker Meetings: More than 4 times per year    Marital Status: Divorced  Intimate Partner Violence: Not on file  Depression (PHQ2-9): Low Risk (01/30/2024)   Depression (PHQ2-9)    PHQ-2 Score: 0  Alcohol Screen: Low Risk (05/24/2022)   Alcohol Screen    Last Alcohol Screening Score (AUDIT): 0  Housing: Low Risk (11/22/2023)   Epic    Unable to Pay for Housing in the Last Year: No    Number of Times Moved in the Last Year: 0    Homeless in the Last Year: No  Utilities: Not At Risk (11/22/2023)   Epic    Threatened with loss of utilities: No  Health Literacy: Not on  file    Family History  Problem Relation Age of Onset   Thyroid  disease Mother    Diabetes Mellitus II Mother    Diabetes Mother    Thyroid  disease Father    Coronary artery disease Father 31       CABG   Healthy Sister    Kidney cancer Maternal Grandmother    Lung cancer Maternal Grandfather        smoker   Hypothyroidism Daughter    Diabetes Son    Hypothyroidism Son    Diabetes Mellitus I Son    Colon cancer Neg Hx    Breast cancer Neg Hx    Active Medications[2]   Physical exam:  Vitals:   01/30/24 1256  BP: (!) 128/91  Pulse: (!) 127  Resp: 16  Temp: 97.8 F (36.6 C)  TempSrc: Tympanic  SpO2: 100%  Weight: 152 lb (68.9 kg)   Physical Exam Vitals reviewed.  Constitutional:      Appearance: She is not ill-appearing.  HENT:     Mouth/Throat:     Lips: Lesions present.     Mouth: Mucous membranes are moist. No injury, oral lesions or angioedema.     Tongue: No lesions.     Palate: No lesions.     Pharynx: No pharyngeal swelling or posterior oropharyngeal erythema.  Neurological:     Mental Status: She is alert.     Assessment and plan- Patient is a 58 y.o. female currently receiving chemotherapy for her breast cancer who presents to symptom management clinic for complaints of mucositis.  Symptoms are consistent with chemotherapy-induced mucositis and secondary viral infection.  She has a history of cold sores which appear to have exacerbation of symptoms.  Recommend starting Valtrex , 1000 mg twice a day when symptomatic then reduce to 1000 mg once a day for prophylaxis through chemotherapy.  She is intolerant to Magic mouthwash and nystatin .  Some improvement but no resolution with Diflucan .  Will plan to start doxepin  0.5% oral rinse, swish and spit 3-6 times a day.  Discussed mechanism of action of antihistamine.  She can continue baking soda rinses. In regards to her rash, continue topical triamcinolone  for prophylaxis.  May consider doxycycline in the  future.  Return to clinic as scheduled or if symptoms do not improve or worsen in the interim.   Visit Diagnosis 1. Mucositis due to chemotherapy    Patient expressed understanding and was in agreement with this plan. She also understands that She can call clinic at any time with any questions, concerns, or complaints.   Thank you for allowing me to participate in the care of this very pleasant patient.   Tinnie Dawn, DNP, AGNP-C, AOCNP [facility] [phone]     [1]  Allergies Allergen Reactions   Aspirin Other (See Comments)    Childhood Allergy    Penicillin G Other (See Comments)    Childhood Allergy   [2]  Current Meds  Medication Sig   acetaminophen  (TYLENOL ) 500 MG tablet Take 1,000 mg by mouth every 6 (six) hours as needed (pain.).   dexamethasone  (DECADRON ) 4 MG tablet Take 2 tabs by mouth 2 times daily starting day before chemo. Then take 2 tabs daily for 2 days starting day after chemo. Take with food.   fluconazole  (DIFLUCAN ) 100 MG tablet Take 1 tablet (100 mg total) by mouth daily.   hydrocortisone  1 % ointment Apply sparingly 1-2 times a day for no more than 2 weeks at a time for chemotherapy induced rash.   ibuprofen  (ADVIL ) 200 MG tablet Take 400-600 mg by mouth every 8 (eight) hours as needed (pain.).   levothyroxine  (SYNTHROID ) 88 MCG tablet TAKE 1 TABLET BY MOUTH DAILY BEFORE BREAKFAST.   lidocaine -prilocaine  (EMLA ) cream Apply to affected area once   loperamide  (IMODIUM ) 2 MG capsule Take 1 capsule (2 mg total) by mouth See admin instructions. Initial: 4 mg,the 2 mg every 2 hours (4 mg every 4 hours at night)  maximum: 16 mg/day   loratadine (CLARITIN) 10 MG tablet Take 10 mg by mouth daily.   metoprolol  tartrate (LOPRESSOR ) 25 MG tablet Take 0.5 tablets (12.5 mg total) by mouth 2 (two) times daily.   Multiple Vitamin (MULTIVITAMIN WITH MINERALS) TABS tablet Take 1 tablet by mouth in the morning.   ondansetron  (ZOFRAN ) 8 MG tablet  Take 1 tablet (  8 mg total) by mouth every 8 (eight) hours as needed for nausea or vomiting. Start on the third day after chemotherapy.   prochlorperazine  (COMPAZINE ) 10 MG tablet Take 1 tablet (10 mg total) by mouth every 6 (six) hours as needed for nausea or vomiting.   Psyllium (METAMUCIL PO) Take 3 capsules by mouth in the morning and at bedtime. 2  to 3 capules   triamcinolone  cream (KENALOG ) 0.1 % Apply 1 Application topically 2 (two) times daily.   valACYclovir  (VALTREX ) 500 MG tablet Take 2 tablets (1000 mg total) by mouth 2 times a day at first signs of outbreak and continue for 7 days. Reduce to 2 tablets (1000 mg) by mouth once a day for suppression.

## 2024-01-30 NOTE — Telephone Encounter (Signed)
 Incoming call. Patient wants to be reevaluated for thrush and mucositis. She wants to see [facility] provider around 1pm today if possible.  She has white patches on her tongue. She just finished the diflucan  rx. She declines using the magic mouthwash or nystatin  rinse due to previous reactio/intolerance to mouth rinses. She has only been using baking soda mouth rinse. She notes that the rash is better on her face. She has tolerated the Kenalog  ointment well.  I spoke with Tinnie, NP. She is agreeable to see patient at 1pm. Msg sent to scheduling to add patient.

## 2024-02-06 ENCOUNTER — Inpatient Hospital Stay: Admitting: Nurse Practitioner

## 2024-02-06 ENCOUNTER — Inpatient Hospital Stay

## 2024-02-12 ENCOUNTER — Inpatient Hospital Stay

## 2024-02-12 ENCOUNTER — Inpatient Hospital Stay: Admitting: Oncology

## 2024-02-12 ENCOUNTER — Ambulatory Visit: Admitting: Oncology

## 2024-02-12 ENCOUNTER — Other Ambulatory Visit

## 2024-02-12 ENCOUNTER — Encounter: Payer: Self-pay | Admitting: Oncology

## 2024-02-12 ENCOUNTER — Telehealth: Payer: Self-pay | Admitting: *Deleted

## 2024-02-12 ENCOUNTER — Ambulatory Visit

## 2024-02-12 VITALS — BP 114/87 | HR 110 | Temp 99.4°F | Resp 18 | Ht 67.0 in | Wt 151.0 lb

## 2024-02-12 VITALS — BP 147/96 | HR 86 | Resp 18

## 2024-02-12 DIAGNOSIS — C50919 Malignant neoplasm of unspecified site of unspecified female breast: Secondary | ICD-10-CM | POA: Diagnosis not present

## 2024-02-12 DIAGNOSIS — Z5111 Encounter for antineoplastic chemotherapy: Secondary | ICD-10-CM | POA: Diagnosis not present

## 2024-02-12 LAB — CBC WITH DIFFERENTIAL (CANCER CENTER ONLY)
Abs Immature Granulocytes: 0.09 K/uL — ABNORMAL HIGH (ref 0.00–0.07)
Basophils Absolute: 0 K/uL (ref 0.0–0.1)
Basophils Relative: 0 %
Eosinophils Absolute: 0 K/uL (ref 0.0–0.5)
Eosinophils Relative: 0 %
HCT: 34.2 % — ABNORMAL LOW (ref 36.0–46.0)
Hemoglobin: 11.5 g/dL — ABNORMAL LOW (ref 12.0–15.0)
Immature Granulocytes: 1 %
Lymphocytes Relative: 10 %
Lymphs Abs: 1.2 K/uL (ref 0.7–4.0)
MCH: 31.9 pg (ref 26.0–34.0)
MCHC: 33.6 g/dL (ref 30.0–36.0)
MCV: 94.7 fL (ref 80.0–100.0)
Monocytes Absolute: 0.5 K/uL (ref 0.1–1.0)
Monocytes Relative: 4 %
Neutro Abs: 9.5 K/uL — ABNORMAL HIGH (ref 1.7–7.7)
Neutrophils Relative %: 85 %
Platelet Count: 195 K/uL (ref 150–400)
RBC: 3.61 MIL/uL — ABNORMAL LOW (ref 3.87–5.11)
RDW: 17.6 % — ABNORMAL HIGH (ref 11.5–15.5)
WBC Count: 11.2 K/uL — ABNORMAL HIGH (ref 4.0–10.5)
nRBC: 0 % (ref 0.0–0.2)

## 2024-02-12 LAB — CMP (CANCER CENTER ONLY)
ALT: 39 U/L (ref 0–44)
AST: 28 U/L (ref 15–41)
Albumin: 4.4 g/dL (ref 3.5–5.0)
Alkaline Phosphatase: 54 U/L (ref 38–126)
Anion gap: 13 (ref 5–15)
BUN: 19 mg/dL (ref 6–20)
CO2: 22 mmol/L (ref 22–32)
Calcium: 9.8 mg/dL (ref 8.9–10.3)
Chloride: 104 mmol/L (ref 98–111)
Creatinine: 0.71 mg/dL (ref 0.44–1.00)
GFR, Estimated: >60 mL/min
Glucose, Bld: 191 mg/dL — ABNORMAL HIGH (ref 70–99)
Potassium: 4.1 mmol/L (ref 3.5–5.1)
Sodium: 138 mmol/L (ref 135–145)
Total Bilirubin: 0.3 mg/dL (ref 0.0–1.2)
Total Protein: 7.2 g/dL (ref 6.5–8.1)

## 2024-02-12 MED ORDER — ACETAMINOPHEN 325 MG PO TABS
650.0000 mg | ORAL_TABLET | Freq: Once | ORAL | Status: AC
  Administered 2024-02-12: 650 mg via ORAL
  Filled 2024-02-12: qty 2

## 2024-02-12 MED ORDER — SODIUM CHLORIDE 0.9% FLUSH
10.0000 mL | INTRAVENOUS | Status: DC | PRN
  Filled 2024-02-12: qty 10

## 2024-02-12 MED ORDER — SODIUM CHLORIDE 0.9% FLUSH
3.0000 mL | INTRAVENOUS | Status: DC | PRN
  Filled 2024-02-12: qty 3

## 2024-02-12 MED ORDER — DEXAMETHASONE SOD PHOSPHATE PF 10 MG/ML IJ SOLN
10.0000 mg | Freq: Once | INTRAMUSCULAR | Status: AC
  Administered 2024-02-12: 10 mg via INTRAVENOUS

## 2024-02-12 MED ORDER — SODIUM CHLORIDE 0.9 % IV SOLN
75.0000 mg/m2 | Freq: Once | INTRAVENOUS | Status: AC
  Administered 2024-02-12: 139 mg via INTRAVENOUS
  Filled 2024-02-12: qty 13.9

## 2024-02-12 MED ORDER — SODIUM CHLORIDE 0.9 % IV SOLN
665.4000 mg | Freq: Once | INTRAVENOUS | Status: AC
  Administered 2024-02-12: 670 mg via INTRAVENOUS
  Filled 2024-02-12: qty 67

## 2024-02-12 MED ORDER — APREPITANT 130 MG/18ML IV EMUL
130.0000 mg | Freq: Once | INTRAVENOUS | Status: AC
  Administered 2024-02-12: 130 mg via INTRAVENOUS
  Filled 2024-02-12: qty 18

## 2024-02-12 MED ORDER — SODIUM CHLORIDE 0.9 % IV SOLN
420.0000 mg | Freq: Once | INTRAVENOUS | Status: AC
  Administered 2024-02-12: 420 mg via INTRAVENOUS
  Filled 2024-02-12: qty 14

## 2024-02-12 MED ORDER — HEPARIN SOD (PORK) LOCK FLUSH 100 UNIT/ML IV SOLN
250.0000 [IU] | Freq: Once | INTRAVENOUS | Status: DC | PRN
  Filled 2024-02-12: qty 5

## 2024-02-12 MED ORDER — SODIUM CHLORIDE 0.9 % IV SOLN
INTRAVENOUS | Status: DC
  Filled 2024-02-12: qty 250

## 2024-02-12 MED ORDER — DIPHENHYDRAMINE HCL 25 MG PO TABS
25.0000 mg | ORAL_TABLET | Freq: Once | ORAL | Status: AC
  Administered 2024-02-12: 25 mg via ORAL
  Filled 2024-02-12: qty 1

## 2024-02-12 MED ORDER — TRASTUZUMAB-ANNS CHEMO 150 MG IV SOLR
6.0000 mg/kg | Freq: Once | INTRAVENOUS | Status: AC
  Administered 2024-02-12: 420 mg via INTRAVENOUS
  Filled 2024-02-12: qty 20

## 2024-02-12 MED ORDER — HEPARIN SOD (PORK) LOCK FLUSH 100 UNIT/ML IV SOLN
500.0000 [IU] | Freq: Once | INTRAVENOUS | Status: DC | PRN
  Filled 2024-02-12: qty 5

## 2024-02-12 MED ORDER — ALTEPLASE 2 MG IJ SOLR
2.0000 mg | Freq: Once | INTRAMUSCULAR | Status: DC | PRN
  Filled 2024-02-12: qty 2

## 2024-02-12 MED ORDER — PALONOSETRON HCL INJECTION 0.25 MG/5ML
0.2500 mg | Freq: Once | INTRAVENOUS | Status: AC
  Administered 2024-02-12: 0.25 mg via INTRAVENOUS
  Filled 2024-02-12: qty 5

## 2024-02-12 NOTE — Telephone Encounter (Signed)
 Office notes faxed to Dr. Cesar Coe to update that patient will be completing last cycle of chemotherapy in 3 weeks.

## 2024-02-12 NOTE — Progress Notes (Signed)
 She wants you to remind Dr. Bella that she only has one more treatment to go so she can get on the surgery schedule.

## 2024-02-12 NOTE — Patient Instructions (Signed)
 [facility] - A DEPT OF [facility]  Discharge Instructions: Thank you for choosing [facility] to provide your oncology and hematology care.  If you have a lab appointment with the Cancer Center, please go directly to the Cancer Center and check in at the registration area.  Wear comfortable clothing and clothing appropriate for easy access to any Portacath or PICC line.   We strive to give you quality time with your provider. You may need to reschedule your appointment if you arrive late (15 or more minutes).  Arriving late affects you and other patients whose appointments are after yours.  Also, if you miss three or more appointments without notifying the office, you may be dismissed from the clinic at the providers discretion.      For prescription refill requests, have your pharmacy contact our office and allow 72 hours for refills to be completed.    Today you received the following chemotherapy and/or immunotherapy agents Trastuzumab , Pertuzumab , Docetaxel , Carboplatin .       To help prevent nausea and vomiting after your treatment, we encourage you to take your nausea medication as directed.  BELOW ARE SYMPTOMS THAT SHOULD BE REPORTED IMMEDIATELY: *FEVER GREATER THAN 100.4 F (38 C) OR HIGHER *CHILLS OR SWEATING *NAUSEA AND VOMITING THAT IS NOT CONTROLLED WITH YOUR NAUSEA MEDICATION *UNUSUAL SHORTNESS OF BREATH *UNUSUAL BRUISING OR BLEEDING *URINARY PROBLEMS (pain or burning when urinating, or frequent urination) *BOWEL PROBLEMS (unusual diarrhea, constipation, pain near the anus) TENDERNESS IN MOUTH AND THROAT WITH OR WITHOUT PRESENCE OF ULCERS (sore throat, sores in mouth, or a toothache) UNUSUAL RASH, SWELLING OR PAIN  UNUSUAL VAGINAL DISCHARGE OR ITCHING   Items with * indicate a potential emergency and should be followed up as soon as possible or go to the Emergency Department if any problems should occur.  Please show the  CHEMOTHERAPY ALERT CARD or IMMUNOTHERAPY ALERT CARD at check-in to the Emergency Department and triage nurse.  Should you have questions after your visit or need to cancel or reschedule your appointment, please contact [facility] - A DEPT OF [facility]  [phone] and follow the prompts.  Office hours are 8:00 a.m. to 4:30 p.m. Monday - Friday. Please note that voicemails left after 4:00 p.m. may not be returned until the following business day.  We are closed weekends and major holidays. You have access to a nurse at all times for urgent questions. Please call the main number to the clinic [phone] and follow the prompts.  For any non-urgent questions, you may also contact your provider using MyChart. We now offer e-Visits for anyone 76 and older to request care online for non-urgent symptoms. For details visit [email or web address].   Also download the MyChart app! Go to the app store, search MyChart, open the app, select , and log in with your MyChart username and password.

## 2024-02-12 NOTE — Progress Notes (Signed)
 " 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  INTERVAL HISTORY: Patient returns to clinic today for further evaluation and consideration of cycle 5 of Taxotere , carboplatin , Herceptin , and Perjeta .  She continues to have mild nausea and diarrhea for several days after treatment, but otherwise feels well and is tolerating her treatments without significant side effects.  She has no neurologic complaints.  She denies any recent fevers or illnesses.  She has a good appetite and denies weight loss.  She denies any chest pain, shortness of breath, cough, or hemoptysis.  She has no urinary complaints.  Patient offers no further specific complaints today.  REVIEW OF SYSTEMS:   Review of Systems  Constitutional:  Positive for malaise/fatigue. Negative for fever and weight loss.  Respiratory: Negative.  Negative for cough, hemoptysis and shortness of breath.   Cardiovascular: Negative.  Negative for chest pain and leg swelling.  Gastrointestinal: Negative.  Negative for diarrhea and nausea.  Genitourinary: Negative.  Negative for dysuria.  Musculoskeletal: Negative.  Negative for back pain.  Skin: Negative.  Negative for rash.  Neurological: Negative.  Negative for dizziness, focal weakness, weakness and headaches.  Psychiatric/Behavioral: Negative.  The patient is not nervous/anxious.    As per HPI. Otherwise, a complete review of systems is negative.    PAST MEDICAL HISTORY: Past Medical History:  Diagnosis Date   Allergy    Penicillin, Aspirin   Anemia July 2023   Blood transfusion without  reported diagnosis July 2023   Cancer [facility])    Goiter    Hypothyroid    Internal hemorrhoid, bleeding 12/2022   Iron  deficiency anemia    Pre-diabetes    Recurrent cold sores    Sinus tachycardia     PAST SURGICAL HISTORY: Past Surgical History:  Procedure Laterality Date   APPENDECTOMY     BREAST BIOPSY Right 05/21/2009   benign   BREAST BIOPSY Right 09/28/2023   US  RT BREAST BX W LOC DEV 1ST LESION IMG BX SPEC US  GUIDE 09/28/2023 [facility]   BREAST BIOPSY Right 09/28/2023   US  RT BREAST BX W LOC DEV EA ADD LESION IMG BX SPEC US  GUIDE 09/28/2023 [facility]   CHOLECYSTECTOMY N/A 02/28/2019   Procedure: LAPAROSCOPIC CHOLECYSTECTOMY;  Surgeon: Desiderio Schanz, MD;  Location: [facility];  Service: General;  Laterality: N/A;   COLONOSCOPY WITH PROPOFOL  N/A 09/14/2021   Procedure: COLONOSCOPY WITH PROPOFOL ;  Surgeon: Jinny Carmine, MD;  Location: [facility];  Service: Endoscopy;  Laterality: N/A;   DE QUERVAIN'S RELEASE Right 06/2022   ESOPHAGOGASTRODUODENOSCOPY (EGD) WITH PROPOFOL  N/A 02/27/2019   Procedure: ESOPHAGOGASTRODUODENOSCOPY (EGD) WITH PROPOFOL ;  Surgeon: Unk Corinn Skiff, MD;  Location: [facility] ENDOSCOPY;  Service: Gastroenterology;  Laterality: N/A;   ESOPHAGOGASTRODUODENOSCOPY (EGD) WITH PROPOFOL  N/A 09/14/2021   Procedure: ESOPHAGOGASTRODUODENOSCOPY (EGD) WITH PROPOFOL ;  Surgeon: Jinny Carmine, MD;  Location: [facility]OS[facility];  Service: Endoscopy;  Laterality: N/A;   GIVENS CAPSULE STUDY N/A 09/15/2021   Procedure: GIVENS CAPSULE STUDY;  Surgeon: Jinny Carmine, MD;  Location[facility]COPY;  Service: Endoscopy;  Laterality: N/A;   HEMORRHOID BANDING  10/20/2022   HEMORRHOID SURGERY N/A 12/28/2022   Procedure: HEMORRHOIDECTOMY;  Surgeon: Rodolph Romano, MD;  Location:  [facility]S;  Service: General;  Laterality: N/A;   KNEE ARTHROSCOPY Left 01/04/2007   torn meniscus   PORT A CATH REVISION Left 12/01/2023   Procedure: PORT A CATH REVISION;  Surgeon:  Rodolph Romano, MD;  Location: [facility];  Service: General;  Laterality: Left;   PORTACATH PLACEMENT N/A 11/08/2023   Procedure: INSERTION, TUNNELED CENTRAL VENOUS DEVICE, WITH PORT;  Surgeon: Rodolph Romano, MD;  Location: [facility];  Service: General;  Laterality: N/A;   VITRECTOMY Right 09/2022    FAMILY HISTORY: Family History  Problem Relation Age of Onset   Thyroid  disease Mother    Diabetes Mellitus II Mother    Diabetes Mother    Thyroid  disease Father    Coronary artery disease Father 22       CABG   Healthy Sister    Kidney cancer Maternal Grandmother    Lung cancer Maternal Grandfather        smoker   Hypothyroidism Daughter    Diabetes Son    Hypothyroidism Son    Diabetes Mellitus I Son    Colon cancer Neg Hx    Breast cancer Neg Hx     ADVANCED DIRECTIVES (Y/N):  N  HEALTH MAINTENANCE: Social History   Tobacco Use   Smoking status: Never   Smokeless tobacco: Never  Vaping Use   Vaping status: Never Used  Substance Use Topics   Alcohol use: Never   Drug use: Never     Colonoscopy:  PAP:  Bone density:  Lipid panel:  Allergies  Allergen Reactions   Aspirin Other (See Comments)    Childhood Allergy    Penicillin G Other (See Comments)    Childhood Allergy     Current Outpatient Medications  Medication Sig Dispense Refill   acetaminophen  (TYLENOL ) 500 MG tablet Take 1,000 mg by mouth every 6 (six) hours as needed (pain.).     dexamethasone  (DECADRON ) 4 MG tablet Take 2 tabs by mouth 2 times daily starting day before chemo. Then take 2 tabs daily for 2 days starting day after chemo. Take with food. 30 tablet 1   doxepin  (SINEQUAN ) 10 MG/ML solution Take 2.5 ml [25mg ] in 5 ml of water ; Oral rinse for 1 minute then spit. Repeat 3-6 times a day.     fluconazole  (DIFLUCAN ) 100 MG tablet Take 1 tablet (100 mg total) by mouth daily. 3 tablet 0   hydrocortisone  1 % ointment Apply sparingly 1-2 times a day for no more than 2 weeks at a time  for chemotherapy induced rash. 30 g 0   ibuprofen  (ADVIL ) 200 MG tablet Take 400-600 mg by mouth every 8 (eight) hours as needed (pain.).     levothyroxine  (SYNTHROID ) 88 MCG tablet TAKE 1 TABLET BY MOUTH DAILY BEFORE BREAKFAST. 90 tablet 2   lidocaine -prilocaine  (EMLA ) cream Apply to affected area once 30 g 3   loperamide  (IMODIUM ) 2 MG capsule Take 1 capsule (2 mg total) by mouth See admin instructions. Initial: 4 mg,the 2 mg every 2 hours (4 mg every 4 hours at night)  maximum: 16 mg/day 60 capsule 2   loratadine (CLARITIN) 10 MG tablet Take 10 mg by mouth daily.     metoprolol  tartrate (LOPRESSOR ) 25 MG tablet Take 1 tablet (25 mg total) by mouth 2 (two) times daily. 90 tablet 3   Multiple Vitamin (MULTIVITAMIN WITH MINERALS) TABS tablet Take 1 tablet by mouth in the morning.     ondansetron  (ZOFRAN ) 8 MG tablet Take 1 tablet (8 mg total)  by mouth every 8 (eight) hours as needed for nausea or vomiting. Start on the third day after chemotherapy. 30 tablet 1   prochlorperazine  (COMPAZINE ) 10 MG tablet Take 1 tablet (10 mg total) by mouth every 6 (six) hours as needed for nausea or vomiting. 30 tablet 1   Psyllium (METAMUCIL PO) Take 3 capsules by mouth in the morning and at bedtime. 2  to 3 capules     triamcinolone  cream (KENALOG ) 0.1 % Apply 1 Application topically 2 (two) times daily. 30 g 0   valACYclovir  (VALTREX ) 500 MG tablet Take 2 tablets (1000 mg total) by mouth 2 times a day at first signs of outbreak and continue for 7 days. Reduce to 2 tablets (1000 mg) by mouth once a day for suppression. 180 tablet 0   No current facility-administered medications for this visit.   Facility-Administered Medications Ordered in Other Visits  Medication Dose Route Frequency Provider Last Rate Last Admin   0.9 %  sodium chloride  infusion   Intravenous Continuous Zakhia Seres J, MD 10 mL/hr at 02/12/24 0959 New Bag at 02/12/24 0959   acetaminophen  (TYLENOL ) tablet 650 mg  650 mg Oral Once Ritta Hammes,  Rasean Joos J, MD       alteplase (CATHFLO ACTIVASE) injection 2 mg  2 mg Intracatheter Once PRN Jovannie Ulibarri J, MD       aprepitant  (CINVANTI ) injection 130 mg  130 mg Intravenous Once Dhairya Corales J, MD       CARBOplatin  (PARAPLATIN ) 670 mg in sodium chloride  0.9 % 250 mL chemo infusion  670 mg Intravenous Once Khaylee Mcevoy J, MD       dexamethasone  (DECADRON ) injection 10 mg  10 mg Intravenous Once Geovanie Winnett J, MD       diphenhydrAMINE  (BENADRYL ) tablet 25 mg  25 mg Oral Once Giavana Rooke J, MD       DOCEtaxel  (TAXOTERE ) 139 mg in sodium chloride  0.9 % 250 mL chemo infusion  75 mg/m2 (Treatment Plan Recorded) Intravenous Once Camay Pedigo J, MD       heparin  lock flush 100 unit/mL  500 Units Intracatheter Once PRN Amarisa Wilinski J, MD       heparin  lock flush 100 unit/mL  250 Units Intracatheter Once PRN Everardo Voris J, MD       palonosetron  (ALOXI ) injection 0.25 mg  0.25 mg Intravenous Once Cyndi Montejano J, MD       pertuzumab  (PERJETA ) 420 mg in sodium chloride  0.9 % 250 mL chemo infusion  420 mg Intravenous Once Loan Oguin J, MD       sodium chloride  flush (NS) 0.9 % injection 10 mL  10 mL Intracatheter PRN Jacobo Evalene PARAS, MD       sodium chloride  flush (NS) 0.9 % injection 3 mL  3 mL Intracatheter PRN Jacobo, Evalene PARAS, MD       trastuzumab -anns (KANJINTI ) 420 mg in sodium chloride  0.9 % 250 mL chemo infusion  6 mg/kg (Treatment Plan Recorded) Intravenous Once Dorotea Hand J, MD        OBJECTIVE: Vitals:   02/12/24 0908  BP: 114/87  Pulse: (!) 110  Resp: 18  Temp: 99.4 F (37.4 C)  SpO2: 98%     Body mass index is 23.65 kg/m.    ECOG FS:0 - Asymptomatic  General: Well-developed, well-nourished, no acute distress. Eyes: Pink conjunctiva, anicteric sclera. HEENT: Normocephalic, moist mucous membranes. Lungs: No audible wheezing or coughing. Heart: Regular rate and rhythm. Abdomen: Soft, nontender, no obvious  distention.  Musculoskeletal: No edema, cyanosis, or clubbing. Neuro: Alert, answering all questions appropriately. Cranial nerves grossly intact. Skin: No rashes or petechiae noted. Psych: Normal affect.  LAB RESULTS:  Lab Results  Component Value Date   NA 138 02/12/2024   K 4.1 02/12/2024   CL 104 02/12/2024   CO2 22 02/12/2024   GLUCOSE 191 (H) 02/12/2024   BUN 19 02/12/2024   CREATININE 0.71 02/12/2024   CALCIUM  9.8 02/12/2024   PROT 7.2 02/12/2024   ALBUMIN 4.4 02/12/2024   AST 28 02/12/2024   ALT 39 02/12/2024   ALKPHOS 54 02/12/2024   BILITOT 0.3 02/12/2024   GFRNONAA >60 02/12/2024   GFRAA 114 07/16/2019    Lab Results  Component Value Date   WBC 11.2 (H) 02/12/2024   NEUTROABS 9.5 (H) 02/12/2024   HGB 11.5 (L) 02/12/2024   HCT 34.2 (L) 02/12/2024   MCV 94.7 02/12/2024   PLT 195 02/12/2024     STUDIES: No results found.   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  Patient's most recent cardiac echo on December 14, 2023 reported an EF of 60 to 65%.  Repeat in early February 2026.  Plan to do 6 treatments and then refer back to surgery.  Return to clinic in 2 days for G-CSF support and then in 3 weeks for further evaluation and consideration of cycle 6.  At the conclusion of patient's chemotherapy,will continue maintenance Herceptin  and Perjeta  3 weeks for a total of 1 year. Rash: Resolved with steroids.  Monitor. Diarrhea: Patient does not complain of this today.  Continue Imodium  as needed. Tachycardia: Chronic and unchanged.  Continue follow-up with cardiology as scheduled. Leukocytosis: Likely reactive, monitor. Anemia: Chronic and unchanged.  Patient's hemoglobin is 11.5 today.  Patient expressed understanding and  was in agreement with this plan. She also understands that She can call clinic at any time with any questions, concerns, or complaints.    Cancer Staging  Invasive carcinoma of breast (HCC) Staging form: Breast, AJCC 8th Edition - Clinical stage from 10/13/2023: Stage IIA (cT2, cN0, cM0, G3, ER-, PR-, HER2+) - Signed by [name], MD on 10/24/2023 Stage prefix: Initial diagnosis Histologic grading system: 3 grade system   Evalene JINNY Reusing, MD   02/12/2024 10:03 AM     "

## 2024-02-13 ENCOUNTER — Encounter: Payer: Self-pay | Admitting: *Deleted

## 2024-02-14 ENCOUNTER — Inpatient Hospital Stay

## 2024-02-14 ENCOUNTER — Ambulatory Visit

## 2024-02-14 DIAGNOSIS — Z5111 Encounter for antineoplastic chemotherapy: Secondary | ICD-10-CM | POA: Diagnosis not present

## 2024-02-14 DIAGNOSIS — C50919 Malignant neoplasm of unspecified site of unspecified female breast: Secondary | ICD-10-CM

## 2024-02-14 MED ORDER — PEGFILGRASTIM-JMDB 6 MG/0.6ML ~~LOC~~ SOSY
6.0000 mg | PREFILLED_SYRINGE | Freq: Once | SUBCUTANEOUS | Status: AC
  Administered 2024-02-14: 6 mg via SUBCUTANEOUS
  Filled 2024-02-14: qty 0.6

## 2024-02-19 ENCOUNTER — Inpatient Hospital Stay: Attending: Oncology

## 2024-02-19 VITALS — BP 122/75 | HR 113 | Temp 97.9°F | Resp 16

## 2024-02-19 DIAGNOSIS — D649 Anemia, unspecified: Secondary | ICD-10-CM | POA: Diagnosis not present

## 2024-02-19 DIAGNOSIS — Z1731 Human epidermal growth factor receptor 2 positive status: Secondary | ICD-10-CM | POA: Diagnosis not present

## 2024-02-19 DIAGNOSIS — Z1722 Progesterone receptor negative status: Secondary | ICD-10-CM | POA: Diagnosis not present

## 2024-02-19 DIAGNOSIS — Z171 Estrogen receptor negative status [ER-]: Secondary | ICD-10-CM | POA: Diagnosis not present

## 2024-02-19 DIAGNOSIS — Z5112 Encounter for antineoplastic immunotherapy: Secondary | ICD-10-CM | POA: Insufficient documentation

## 2024-02-19 DIAGNOSIS — Z5189 Encounter for other specified aftercare: Secondary | ICD-10-CM | POA: Diagnosis not present

## 2024-02-19 DIAGNOSIS — C50911 Malignant neoplasm of unspecified site of right female breast: Secondary | ICD-10-CM | POA: Insufficient documentation

## 2024-02-19 DIAGNOSIS — C50919 Malignant neoplasm of unspecified site of unspecified female breast: Secondary | ICD-10-CM

## 2024-02-19 DIAGNOSIS — Z5111 Encounter for antineoplastic chemotherapy: Secondary | ICD-10-CM | POA: Insufficient documentation

## 2024-02-19 DIAGNOSIS — R000 Tachycardia, unspecified: Secondary | ICD-10-CM | POA: Diagnosis not present

## 2024-02-19 MED ORDER — SODIUM CHLORIDE 0.9 % IV SOLN
INTRAVENOUS | Status: DC
  Filled 2024-02-19 (×2): qty 250

## 2024-02-27 ENCOUNTER — Inpatient Hospital Stay

## 2024-02-27 ENCOUNTER — Inpatient Hospital Stay: Admitting: Oncology

## 2024-02-29 ENCOUNTER — Inpatient Hospital Stay

## 2024-03-04 ENCOUNTER — Inpatient Hospital Stay

## 2024-03-04 ENCOUNTER — Inpatient Hospital Stay: Admitting: Oncology

## 2024-03-04 ENCOUNTER — Encounter: Payer: Self-pay | Admitting: *Deleted

## 2024-03-04 ENCOUNTER — Encounter: Payer: Self-pay | Admitting: Oncology

## 2024-03-04 VITALS — BP 127/90 | HR 109 | Temp 98.2°F | Resp 18 | Ht 67.0 in | Wt 150.0 lb

## 2024-03-04 DIAGNOSIS — C50919 Malignant neoplasm of unspecified site of unspecified female breast: Secondary | ICD-10-CM

## 2024-03-04 DIAGNOSIS — Z5111 Encounter for antineoplastic chemotherapy: Secondary | ICD-10-CM | POA: Diagnosis not present

## 2024-03-04 LAB — CMP (CANCER CENTER ONLY)
ALT: 24 U/L (ref 0–44)
AST: 23 U/L (ref 15–41)
Albumin: 4.2 g/dL (ref 3.5–5.0)
Alkaline Phosphatase: 54 U/L (ref 38–126)
Anion gap: 12 (ref 5–15)
BUN: 19 mg/dL (ref 6–20)
CO2: 22 mmol/L (ref 22–32)
Calcium: 9.9 mg/dL (ref 8.9–10.3)
Chloride: 104 mmol/L (ref 98–111)
Creatinine: 0.67 mg/dL (ref 0.44–1.00)
GFR, Estimated: >60 mL/min
Glucose, Bld: 150 mg/dL — ABNORMAL HIGH (ref 70–99)
Potassium: 4.1 mmol/L (ref 3.5–5.1)
Sodium: 137 mmol/L (ref 135–145)
Total Bilirubin: 0.3 mg/dL (ref 0.0–1.2)
Total Protein: 7.2 g/dL (ref 6.5–8.1)

## 2024-03-04 LAB — CBC WITH DIFFERENTIAL (CANCER CENTER ONLY)
Abs Immature Granulocytes: 0.06 K/uL (ref 0.00–0.07)
Basophils Absolute: 0 K/uL (ref 0.0–0.1)
Basophils Relative: 0 %
Eosinophils Absolute: 0 K/uL (ref 0.0–0.5)
Eosinophils Relative: 0 %
HCT: 33.8 % — ABNORMAL LOW (ref 36.0–46.0)
Hemoglobin: 11.4 g/dL — ABNORMAL LOW (ref 12.0–15.0)
Immature Granulocytes: 1 %
Lymphocytes Relative: 14 %
Lymphs Abs: 1.2 K/uL (ref 0.7–4.0)
MCH: 32.9 pg (ref 26.0–34.0)
MCHC: 33.7 g/dL (ref 30.0–36.0)
MCV: 97.7 fL (ref 80.0–100.0)
Monocytes Absolute: 0.5 K/uL (ref 0.1–1.0)
Monocytes Relative: 5 %
Neutro Abs: 6.7 K/uL (ref 1.7–7.7)
Neutrophils Relative %: 80 %
Platelet Count: 200 K/uL (ref 150–400)
RBC: 3.46 MIL/uL — ABNORMAL LOW (ref 3.87–5.11)
RDW: 16.5 % — ABNORMAL HIGH (ref 11.5–15.5)
WBC Count: 8.4 K/uL (ref 4.0–10.5)
nRBC: 0 % (ref 0.0–0.2)

## 2024-03-04 MED ORDER — APREPITANT 130 MG/18ML IV EMUL
130.0000 mg | Freq: Once | INTRAVENOUS | Status: AC
  Administered 2024-03-04: 130 mg via INTRAVENOUS
  Filled 2024-03-04: qty 18

## 2024-03-04 MED ORDER — DIPHENHYDRAMINE HCL 25 MG PO TABS
25.0000 mg | ORAL_TABLET | Freq: Once | ORAL | Status: AC
  Administered 2024-03-04: 25 mg via ORAL
  Filled 2024-03-04: qty 1

## 2024-03-04 MED ORDER — SODIUM CHLORIDE 0.9 % IV SOLN
75.0000 mg/m2 | Freq: Once | INTRAVENOUS | Status: AC
  Administered 2024-03-04: 139 mg via INTRAVENOUS
  Filled 2024-03-04: qty 13.9

## 2024-03-04 MED ORDER — SODIUM CHLORIDE 0.9 % IV SOLN
420.0000 mg | Freq: Once | INTRAVENOUS | Status: AC
  Administered 2024-03-04: 420 mg via INTRAVENOUS
  Filled 2024-03-04: qty 14

## 2024-03-04 MED ORDER — SODIUM CHLORIDE 0.9 % IV SOLN
INTRAVENOUS | Status: DC
  Filled 2024-03-04: qty 250

## 2024-03-04 MED ORDER — DEXAMETHASONE SOD PHOSPHATE PF 10 MG/ML IJ SOLN
10.0000 mg | Freq: Once | INTRAMUSCULAR | Status: AC
  Administered 2024-03-04: 10 mg via INTRAVENOUS
  Filled 2024-03-04: qty 1

## 2024-03-04 MED ORDER — ACETAMINOPHEN 325 MG PO TABS
650.0000 mg | ORAL_TABLET | Freq: Once | ORAL | Status: AC
  Administered 2024-03-04: 650 mg via ORAL
  Filled 2024-03-04: qty 2

## 2024-03-04 MED ORDER — SODIUM CHLORIDE 0.9 % IV SOLN
665.4000 mg | Freq: Once | INTRAVENOUS | Status: AC
  Administered 2024-03-04: 670 mg via INTRAVENOUS
  Filled 2024-03-04: qty 67

## 2024-03-04 MED ORDER — TRASTUZUMAB-ANNS CHEMO 150 MG IV SOLR
6.0000 mg/kg | Freq: Once | INTRAVENOUS | Status: AC
  Administered 2024-03-04: 420 mg via INTRAVENOUS
  Filled 2024-03-04: qty 20

## 2024-03-04 MED ORDER — PALONOSETRON HCL INJECTION 0.25 MG/5ML
0.2500 mg | Freq: Once | INTRAVENOUS | Status: AC
  Administered 2024-03-04: 0.25 mg via INTRAVENOUS
  Filled 2024-03-04: qty 5

## 2024-03-04 NOTE — Patient Instructions (Signed)
 [facility] - A DEPT OF [facility]  Discharge Instructions: Thank you for choosing [facility] to provide your oncology and hematology care.  If you have a lab appointment with the Cancer Center, please go directly to the Cancer Center and check in at the registration area.  Wear comfortable clothing and clothing appropriate for easy access to any Portacath or PICC line.   We strive to give you quality time with your provider. You may need to reschedule your appointment if you arrive late (15 or more minutes).  Arriving late affects you and other patients whose appointments are after yours.  Also, if you miss three or more appointments without notifying the office, you may be dismissed from the clinic at the providers discretion.      For prescription refill requests, have your pharmacy contact our office and allow 72 hours for refills to be completed.    Today you received the following chemotherapy and/or immunotherapy agents: CARBOplatin  / DOCEtaxel  /pertuzumab  /  To help prevent nausea and vomiting after your treatment, we encourage you to take your nausea medication as directed.  BELOW ARE SYMPTOMS THAT SHOULD BE REPORTED IMMEDIATELY: *FEVER GREATER THAN 100.4 F (38 C) OR HIGHER *CHILLS OR SWEATING *NAUSEA AND VOMITING THAT IS NOT CONTROLLED WITH YOUR NAUSEA MEDICATION *UNUSUAL SHORTNESS OF BREATH *UNUSUAL BRUISING OR BLEEDING *URINARY PROBLEMS (pain or burning when urinating, or frequent urination) *BOWEL PROBLEMS (unusual diarrhea, constipation, pain near the anus) TENDERNESS IN MOUTH AND THROAT WITH OR WITHOUT PRESENCE OF ULCERS (sore throat, sores in mouth, or a toothache) UNUSUAL RASH, SWELLING OR PAIN  UNUSUAL VAGINAL DISCHARGE OR ITCHING   Items with * indicate a potential emergency and should be followed up as soon as possible or go to the Emergency Department if any problems should occur.  Please show the CHEMOTHERAPY  ALERT CARD or IMMUNOTHERAPY ALERT CARD at check-in to the Emergency Department and triage nurse.  Should you have questions after your visit or need to cancel or reschedule your appointment, please contact [facility] - A DEPT OF [facility]  [phone] and follow the prompts.  Office hours are 8:00 a.m. to 4:30 p.m. Monday - Friday. Please note that voicemails left after 4:00 p.m. may not be returned until the following business day.  We are closed weekends and major holidays. You have access to a nurse at all times for urgent questions. Please call the main number to the clinic [phone] and follow the prompts.  For any non-urgent questions, you may also contact your provider using MyChart. We now offer e-Visits for anyone 75 and older to request care online for non-urgent symptoms. For details visit [email or web address].   Also download the MyChart app! Go to the app store, search MyChart, open the app, select [place], and log in with your MyChart username and password.

## 2024-03-04 NOTE — Progress Notes (Signed)
 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  INTERVAL HISTORY: Patient returns to clinic today for further evaluation and consideration of cycle 6 of neoadjuvant Taxotere , carboplatin , Herceptin , and Perjeta .  She continues to have mild nausea and diarrhea for several days after treatment.  She otherwise is tolerating her treatments well without significant side effects. She has no neurologic complaints.  She denies any recent fevers or illnesses.  She has a good appetite and denies weight loss.  She denies any chest pain, shortness of breath, cough, or hemoptysis.  She has no urinary complaints.  Patient offers no further specific complaints today.  REVIEW OF SYSTEMS:   Review of Systems  Constitutional:  Positive for malaise/fatigue. Negative for fever and weight loss.  Respiratory: Negative.  Negative for cough, hemoptysis and shortness of breath.   Cardiovascular: Negative.  Negative for chest pain and leg swelling.  Gastrointestinal: Negative.  Negative for diarrhea and nausea.  Genitourinary: Negative.  Negative for dysuria.  Musculoskeletal: Negative.  Negative for back pain.  Skin: Negative.  Negative for rash.  Neurological: Negative.  Negative for dizziness, focal weakness, weakness and headaches.  Psychiatric/Behavioral: Negative.  The patient is not nervous/anxious.    As per HPI. Otherwise, a complete review of systems is negative.    PAST MEDICAL HISTORY: Past Medical History:  Diagnosis Date   Allergy    Penicillin, Aspirin   Anemia July 2023   Blood transfusion without  reported diagnosis July 2023   Cancer [facility])    Goiter    Hypothyroid    Internal hemorrhoid, bleeding 12/2022   Iron  deficiency anemia    Pre-diabetes    Recurrent cold sores    Sinus tachycardia     PAST SURGICAL HISTORY: Past Surgical History:  Procedure Laterality Date   APPENDECTOMY     BREAST BIOPSY Right 05/21/2009   benign   BREAST BIOPSY Right 09/28/2023   US  RT BREAST BX W LOC DEV 1ST LESION IMG BX SPEC US  GUIDE 09/28/2023 [facility]   BREAST BIOPSY Right 09/28/2023   US  RT BREAST BX W LOC DEV EA ADD LESION IMG BX SPEC US  GUIDE 09/28/2023 [facility]   CHOLECYSTECTOMY N/A 02/28/2019   Procedure: LAPAROSCOPIC CHOLECYSTECTOMY;  Surgeon: Desiderio Schanz, MD;  Location: [facility] ORS;  Service: General;  Laterality: N/A;   COLONOSCOPY WITH PROPOFOL  N/A 09/14/2021   Procedure: COLONOSCOPY WITH PROPOFOL ;  Surgeon: Jinny Carmine, MD;  Location: [facility] ENDOSCOPY;  Service: Endoscopy;  Laterality: N/A;   DE QUERVAIN'S RELEASE Right 06/2022   ESOPHAGOGASTRODUODENOSCOPY (EGD) WITH PROPOFOL  N/A 02/27/2019   Procedure: ESOPHAGOGASTRODUODENOSCOPY (EGD) WITH PROPOFOL ;  Surgeon: Unk Corinn Skiff, MD;  Location: [facility] ENDOSCOPY;  Service: Gastroenterology;  Laterality: N/A;   ESOPHAGOGASTRODUODENOSCOPY (EGD) WITH PROPOFOL  N/A 09/14/2021   Procedure: ESOPHAGOGASTRODUODENOSCOPY (EGD) WITH PROPOFOL ;  Surgeon: Jinny Carmine, MD;  Location: [facility] ENDOSCOPY;  Service: Endoscopy;  Laterality: N/A;   GIVENS CAPSULE STUDY N/A 09/15/2021   Procedure: GIVENS CAPSULE STUDY;  Surgeon: Jinny Carmine, MD;  Location: [facility] ENDOSCOPY;  Service: Endoscopy;  Laterality: N/A;   HEMORRHOID BANDING  10/20/2022   HEMORRHOID SURGERY N/A 12/28/2022   Procedure: HEMORRHOIDECTOMY;  Surgeon: Rodolph Romano, MD;  Location: [facility]  ORS;  Service: General;  Laterality: N/A;   KNEE ARTHROSCOPY Left 01/04/2007   torn meniscus   PORT A CATH REVISION Left 12/01/2023   Procedure: PORT A CATH REVISION;  Surgeon:  Rodolph Romano, MD;  Location: [facility] ORS;  Service: General;  Laterality: Left;   PORTACATH PLACEMENT N/A 11/08/2023   Procedure: INSERTION, TUNNELED CENTRAL VENOUS DEVICE, WITH PORT;  Surgeon: Rodolph Romano, MD;  Location: [facility] ORS;  Service: General;  Laterality: N/A;   VITRECTOMY Right 09/2022    FAMILY HISTORY: Family History  Problem Relation Age of Onset   Thyroid  disease Mother    Diabetes Mellitus II Mother    Diabetes Mother    Thyroid  disease Father    Coronary artery disease Father 94       CABG   Healthy Sister    Kidney cancer Maternal Grandmother    Lung cancer Maternal Grandfather        smoker   Hypothyroidism Daughter    Diabetes Son    Hypothyroidism Son    Diabetes Mellitus I Son    Colon cancer Neg Hx    Breast cancer Neg Hx     ADVANCED DIRECTIVES (Y/N):  N  HEALTH MAINTENANCE: Social History   Tobacco Use   Smoking status: Never   Smokeless tobacco: Never  Vaping Use   Vaping status: Never Used  Substance Use Topics   Alcohol use: Never   Drug use: Never     Colonoscopy:  PAP:  Bone density:  Lipid panel:  Allergies  Allergen Reactions   Aspirin Other (See Comments)    Childhood Allergy    Penicillin G Other (See Comments)    Childhood Allergy     Current Outpatient Medications  Medication Sig Dispense Refill   acetaminophen  (TYLENOL ) 500 MG tablet Take 1,000 mg by mouth every 6 (six) hours as needed (pain.).     dexamethasone  (DECADRON ) 4 MG tablet Take 2 tabs by mouth 2 times daily starting day before chemo. Then take 2 tabs daily for 2 days starting day after chemo. Take with food. 30 tablet 1   doxepin  (SINEQUAN ) 10 MG/ML solution Take 2.5 ml [25mg ] in 5 ml of water ; Oral rinse for 1 minute then spit. Repeat 3-6 times a day.     fluconazole  (DIFLUCAN ) 100 MG tablet Take 1 tablet (100 mg total) by mouth daily. 3 tablet 0   hydrocortisone  1 % ointment Apply sparingly 1-2 times a day for no more than 2 weeks at a time  for chemotherapy induced rash. 30 g 0   ibuprofen  (ADVIL ) 200 MG tablet Take 400-600 mg by mouth every 8 (eight) hours as needed (pain.).     levothyroxine  (SYNTHROID ) 88 MCG tablet TAKE 1 TABLET BY MOUTH DAILY BEFORE BREAKFAST. 90 tablet 2   lidocaine -prilocaine  (EMLA ) cream Apply to affected area once 30 g 3   loperamide  (IMODIUM ) 2 MG capsule Take 1 capsule (2 mg total) by mouth See admin instructions. Initial: 4 mg,the 2 mg every 2 hours (4 mg every 4 hours at night)  maximum: 16 mg/day 60 capsule 2   loratadine (CLARITIN) 10 MG tablet Take 10 mg by mouth daily.     metoprolol  tartrate (LOPRESSOR ) 25 MG tablet Take 1 tablet (25 mg total) by mouth 2 (two) times daily. 90 tablet 3   Multiple Vitamin (MULTIVITAMIN WITH MINERALS) TABS tablet Take 1 tablet by mouth in the morning.     ondansetron  (ZOFRAN ) 8 MG tablet Take 1 tablet (8 mg total) by  mouth every 8 (eight) hours as needed for nausea or vomiting. Start on the third day after chemotherapy. 30 tablet 1   prochlorperazine  (COMPAZINE ) 10 MG tablet Take 1 tablet (10 mg total) by mouth every 6 (six) hours as needed for nausea or vomiting. 30 tablet 1   Psyllium (METAMUCIL PO) Take 3 capsules by mouth in the morning and at bedtime. 2  to 3 capules     triamcinolone  cream (KENALOG ) 0.1 % Apply 1 Application topically 2 (two) times daily. 30 g 0   valACYclovir  (VALTREX ) 500 MG tablet Take 2 tablets (1000 mg total) by mouth 2 times a day at first signs of outbreak and continue for 7 days. Reduce to 2 tablets (1000 mg) by mouth once a day for suppression. 180 tablet 0   No current facility-administered medications for this visit.   Facility-Administered Medications Ordered in Other Visits  Medication Dose Route Frequency Provider Last Rate Last Admin   0.9 %  sodium chloride  infusion   Intravenous Continuous Jacobo Annette PARAS, MD 10 mL/hr at 03/04/24 0959 New Bag at 03/04/24 0959   CARBOplatin  (PARAPLATIN ) 670 mg in sodium chloride  0.9 % 250 mL  chemo infusion  670 mg Intravenous Once Kolbey Teichert J, MD       DOCEtaxel  (TAXOTERE ) 139 mg in sodium chloride  0.9 % 250 mL chemo infusion  75 mg/m2 (Treatment Plan Recorded) Intravenous Once Rainier Feuerborn J, MD       pertuzumab  (PERJETA ) 420 mg in sodium chloride  0.9 % 250 mL chemo infusion  420 mg Intravenous Once Nicko Daher J, MD 528 mL/hr at 03/04/24 1130 420 mg at 03/04/24 1130    OBJECTIVE: Vitals:   03/04/24 0924  BP: (!) 127/90  Pulse: (!) 109  Resp: 18  Temp: 98.2 F (36.8 C)  SpO2: 97%     Body mass index is 23.49 kg/m.    ECOG FS:0 - Asymptomatic  General: Well-developed, well-nourished, no acute distress. Eyes: Pink conjunctiva, anicteric sclera. HEENT: Normocephalic, moist mucous membranes. Lungs: No audible wheezing or coughing. Heart: Regular rate and rhythm. Abdomen: Soft, nontender, no obvious distention. Musculoskeletal: No edema, cyanosis, or clubbing. Neuro: Alert, answering all questions appropriately. Cranial nerves grossly intact. Skin: No rashes or petechiae noted. Psych: Normal affect.  LAB RESULTS:  Lab Results  Component Value Date   NA 137 03/04/2024   K 4.1 03/04/2024   CL 104 03/04/2024   CO2 22 03/04/2024   GLUCOSE 150 (H) 03/04/2024   BUN 19 03/04/2024   CREATININE 0.67 03/04/2024   CALCIUM  9.9 03/04/2024   PROT 7.2 03/04/2024   ALBUMIN 4.2 03/04/2024   AST 23 03/04/2024   ALT 24 03/04/2024   ALKPHOS 54 03/04/2024   BILITOT 0.3 03/04/2024   GFRNONAA >60 03/04/2024   GFRAA 114 07/16/2019    Lab Results  Component Value Date   WBC 8.4 03/04/2024   NEUTROABS 6.7 03/04/2024   HGB 11.4 (L) 03/04/2024   HCT 33.8 (L) 03/04/2024   MCV 97.7 03/04/2024   PLT 200 03/04/2024     STUDIES: No results found.   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  Patient's most recent cardiac echo on December 14, 2023 reported an EF of 60 to 65%.  Repeat in early February 2026.  Patient has appointment  with surgery on March 12, 2024 for evaluation and surgical planning.  Currently, her year-long maintenance treatment with Herceptin  and Perjeta  scheduled to start in 3 weeks, but this may be adjusted based on her surgical schedule.  Return to clinic in 2 days for G-CSF support in 3 weeks for further evaluation and continuation of treatment.  Rash: Resolved with steroids.  Monitor. Diarrhea: Patient does not complain of this today.  Continue Imodium  as needed. Tachycardia: Improved.  Continue follow-up with cardiology as scheduled. Leukocytosis: Resolved.   Anemia: Chronic and unchanged.  Patient's hemoglobin is 11.4 today.     Patient expressed understanding and was in agreement with this plan. She also understands that She can call clinic at any time with any questions, concerns, or complaints.    Cancer Staging  Invasive carcinoma of breast [facility]) Staging form: Breast, AJCC 8th Edition - Clinical stage from 10/13/2023: Stage IIA (cT2, cN0, cM0, G3, ER-, PR-, HER2+) - Signed by [name], MD on 10/24/2023 Stage prefix: Initial diagnosis Histologic grading system: 3 grade system   Annette JINNY Reusing, MD   03/04/2024 11:55 AM     "

## 2024-03-06 ENCOUNTER — Inpatient Hospital Stay

## 2024-03-06 ENCOUNTER — Ambulatory Visit: Attending: Oncology | Admitting: Occupational Therapy

## 2024-03-06 ENCOUNTER — Encounter: Payer: Self-pay | Admitting: Occupational Therapy

## 2024-03-06 DIAGNOSIS — Z5111 Encounter for antineoplastic chemotherapy: Secondary | ICD-10-CM | POA: Diagnosis not present

## 2024-03-06 DIAGNOSIS — C50919 Malignant neoplasm of unspecified site of unspecified female breast: Secondary | ICD-10-CM

## 2024-03-06 DIAGNOSIS — R293 Abnormal posture: Secondary | ICD-10-CM | POA: Insufficient documentation

## 2024-03-06 MED ORDER — PEGFILGRASTIM-JMDB 6 MG/0.6ML ~~LOC~~ SOSY
6.0000 mg | PREFILLED_SYRINGE | Freq: Once | SUBCUTANEOUS | Status: AC
  Administered 2024-03-06: 6 mg via SUBCUTANEOUS
  Filled 2024-03-06: qty 0.6

## 2024-03-06 NOTE — Therapy (Signed)
 " OUTPATIENT OCCUPATIONAL THERAPY BREAST CANCER BASELINE EVALUATION   Patient Name: Annette Phillips MRN: [id number] DOB:12-24-1965, 59 y.o., female Today's Date: 03/06/2024  END OF SESSION:  OT End of Session - 03/06/24 1720     Visit Number 1    Number of Visits 6    Date for Recertification  05/29/24    OT Start Time 1301    OT Stop Time 1330    OT Time Calculation (min) 29 min    Activity Tolerance Patient tolerated treatment well    Behavior During Therapy [facility] for tasks assessed/performed          Past Medical History:  Diagnosis Date   Allergy    Penicillin, Aspirin   Anemia July 2023   Blood transfusion without reported diagnosis July 2023   Cancer [facility])    Goiter    Hypothyroid    Internal hemorrhoid, bleeding 12/2022   Iron  deficiency anemia    Pre-diabetes    Recurrent cold sores    Sinus tachycardia    Past Surgical History:  Procedure Laterality Date   APPENDECTOMY     BREAST BIOPSY Right 05/21/2009   benign   BREAST BIOPSY Right 09/28/2023   US  RT BREAST BX W LOC DEV 1ST LESION IMG BX SPEC US  GUIDE 09/28/2023 [facility]   BREAST BIOPSY Right 09/28/2023   US  RT BREAST BX W LOC DEV EA ADD LESION IMG BX SPEC US  GUIDE 09/28/2023 [facility]   CHOLECYSTECTOMY N/A 02/28/2019   Procedure: LAPAROSCOPIC CHOLECYSTECTOMY;  Surgeon: Desiderio Schanz, MD;  Location: [facility] ORS;  Service: General;  Laterality: N/A;   COLONOSCOPY WITH PROPOFOL  N/A 09/14/2021   Procedure: COLONOSCOPY WITH PROPOFOL ;  Surgeon: Jinny Carmine, MD;  Location: [facility] ENDOSCOPY;  Service: Endoscopy;  Laterality: N/A;   DE QUERVAIN'S RELEASE Right 06/2022   ESOPHAGOGASTRODUODENOSCOPY (EGD) WITH PROPOFOL  N/A 02/27/2019   Procedure: ESOPHAGOGASTRODUODENOSCOPY (EGD) WITH PROPOFOL ;  Surgeon: Unk Corinn Skiff, MD;  Location: [facility]OS[facility];  Service: Gastroenterology;  Laterality: N/A;   ESOPHAGOGASTRODUODENOSCOPY (EGD) WITH PROPOFOL  N/A 09/14/2021   Procedure: ESOPHAGOGASTRODUODENOSCOPY (EGD)  WITH PROPOFOL ;  Surgeon: Jinny Carmine, MD;  Location: [facility];  Service: Endoscopy;  Laterality: N/A;   GIVENS CAPSULE STUDY N/A 09/15/2021   Procedure: GIVENS CAPSULE STUDY;  Surgeon: Jinny Carmine, MD;  Location: [facility] ENDOSCOPY;  Service: Endoscopy;  Laterality: N/A;   HEMORRHOID BANDING  10/20/2022   HEMORRHOID SURGERY N/A 12/28/2022   Procedure: HEMORRHOIDECTOMY;  Surgeon: Rodolph Romano, MD;  Location: [facility];  Service: General;  Laterality: N/A;   KNEE ARTHROSCOPY Left 01/04/2007   torn meniscus   PORT A CATH REVISION Left 12/01/2023   Procedure: PORT A CATH REVISION;  Surgeon: Rodolph Romano, MD;  Location: [facility]S;  Service: General;  Laterality: Left;   PORTACATH PLACEMENT N/A 11/08/2023   Procedure: INSERTION, TUNNELED CENTRAL VENOUS DEVICE, WITH PORT;  Surgeon: Rodolph Romano, MD;  Location: [facility] ORS;  Service: General;  Laterality: N/A;   VITRECTOMY Right 09/2022   Patient Active Problem List   Diagnosis Date Noted   Skin rash 12/05/2023   Prediabetes 12/05/2023   Dehydration 11/22/2023   Chemotherapy induced diarrhea 11/20/2023   Mucositis 11/20/2023   Thrush 11/20/2023   Chemotherapy induced neutropenia 11/20/2023   Encounter for antineoplastic chemotherapy 11/13/2023   Port-A-Cath in place 11/13/2023   Invasive carcinoma of breast (HCC) 10/13/2023   Family history of cancer 10/13/2023   IDA (iron  deficiency anemia) 12/06/2021   Symptomatic anemia 09/12/2021   Sinus tachycardia 09/12/2021   Elevated lactic acid level  09/12/2021   Elevated liver enzymes 07/16/2020   Esophagitis 02/26/2019   Family history of diabetes mellitus 01/30/2019   Hypothyroid     PCP: Dr Elin  REFERRING PROVIDER: Dr Jacobo  REFERRING DIAG: R breast Cancer   THERAPY DIAG:  Abnormal posture  Rationale for Evaluation and Treatment: Rehabilitation  ONSET DATE: 12/07/23  SUBJECTIVE:                                                                                                                                                                                            SUBJECTIVE STATEMENT: Patient reports she is here today after being refer by one of her medical team for her newly diagnosed right breast cancer.   PERTINENT HISTORY:  Patient was diagnosed with right  breast cancer - had chemotherapy first and plan to meet with surgeon 03/12/2024 for possible bilateral mastectomy    PATIENT GOALS:   reduce lymphedema risk and learn post op HEP.   PAIN:  Are you having pain?  Denies any pain  PRECAUTIONS: Active CA, right upper extremity lymphedema risk    HAND DOMINANCE: right  WEIGHT BEARING RESTRICTIONS: No  FALLS:  Has patient fallen in last 6 months? 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  Patient worked now at [employer].  Mostly in the computer.  Patient states crafts in her free time and spending time with her grandkids.  3 under the age of 49.  And having twin grandkids in May   OBJECTIVE:  COGNITION: Overall cognitive status: Within functional limits for tasks assessed    POSTURE:   rounded shoulders posture  UPPER EXTREMITY AROM/PROM: Bilateral shoulder active range of motion within normal range CERVICAL AROM: All within normal limits:     UPPER EXTREMITY STRENGTH: Bilateral upper extremity shoulder flexion abduction 5/5.  External or internal rotation 5 -/5  LYMPHEDEMA ASSESSMENTS:    L-DEX LYMPHEDEMA SCREENING:  The patient was assessed using the L-Dex machine today to produce a lymphedema index baseline score. The patient will be reassessed on a regular basis (typically every 3 months) to obtain new L-Dex scores. If the score is > 6.5 points away from his/her baseline score indicating onset of subclinical lymphedema, it will be recommended to wear a compression garment for 4 weeks, 12 hours per day and then be  reassessed. If the score continues to be > 6.5 points from baseline at reassessment, we will initiate lymphedema treatment. Assessing in this manner has a 95% rate of preventing clinically significant lymphedema.   L-DEX FLOWSHEETS - 03/06/24 1700  L-DEX LYMPHEDEMA SCREENING   Measurement Type Unilateral    L-DEX MEASUREMENT EXTREMITY Upper Extremity    POSITION  Standing    DOMINANT SIDE Right    At Risk Side Right    BASELINE SCORE (UNILATERAL) -1.2         Baseline L-Dex score done this date. Home exercise program for postop Supine active assisted range of motion using a wand for shoulder flexion and abduction to 90 degrees while drain sets in.  When okayed by surgeon can go over 90 degrees.  With a slight pull less than a 2/10 pain.  3 times a day 10-12 reps External rotation in supine one-handed time behind head relax elbow with gravity 10 reps Separate for a couple of weeks and then can attempt bilateral at the same time. Slight pull keeping pain under 2/10. Patient was provided handout on lymphedema signs and symptoms as well as precautions and prevention.  Reviewed with patient.  Will be further reviewed after surgery.  PATIENT EDUCATION:  Education details: Lymphedema risk reduction and post op shoulder/posture HEP Person educated: Patient Education method: Explanation, Demonstration, Handout Education comprehension: Patient verbalized understanding and returned demonstration   ASSESSMENT:  CLINICAL IMPRESSION: Her multidisciplinary medical team has met to assess and determine a recommended treatment plan.  She had chemotherapy first.  In meeting with Dr. Rodolph on 03/12/2024.  For possible bilateral mastectomy.  Patient was educated in home program for range of motion as well as educated in signs and symptoms as well as precautions and prevention for lymphedema.. She will benefit from a post op OT reassessment to determine needs and from L-Dex screens every 3  months for 2 years to detect subclinical lymphedema.  Pt will benefit from skilled therapeutic intervention to improve on the following deficits: Decreased knowledge of precautions and lymphedema education, impaired UE functional use, pain, decreased ROM, postural dysfunction.   OT treatment/interventions: ADL/self-care home management, pt/family education, therapeutic exercise,manual therapy  REHAB POTENTIAL: Good  CLINICAL DECISION MAKING: Stable/uncomplicated  EVALUATION COMPLEXITY: Low   GOALS: Goals reviewed with patient? YES  LONG TERM GOALS: (STG=LTG)    Name Target Date Goal status  1 Pt will be able to verbalize understanding of pertinent lymphedema risk reduction practices relevant to her dx specifically related to skin care.  Baseline:  No knowledge 12 weeks Initial  2 Pt will be able to return demo and/or verbalize understanding of the post op HEP related to regaining shoulder ROM. Baseline:  No knowledge Today Achieved at eval         4 Pt will demo she has regained full shoulder ROM and function post operatively compared to baselines.  Baseline: See objective measurements taken today. 12 weeks Initial    PLAN:  OT FREQUENCY/DURATION: EVAL and 5 follow up appointment. - 12 wks   PLAN FOR NEXT SESSION: will reassess 3 weeks post op to determine needs.   Pati T Occupational Therapy Information for After Breast Cancer Surgery/Treatment:  Lymphedema is a swelling condition that you may be at risk for in your arm if you have lymph nodes removed from the armpit area.  After a sentinel node biopsy, the risk is approximately 5-9% and is higher after an axillary node dissection.  There is treatment available for this condition and it is not life-threatening.  Contact your physician or occupational therapist with concerns. You may begin the 4 shoulder/posture exercises (see additional sheet) when permitted by your physician (typically a week after surgery).  If you have  drains, you may need to wait until those are removed before beginning range of motion exercises.  A general recommendation is to not lift your arms above shoulder height until drains are removed.  These exercises should be done to your tolerance and gently.  This is not a no pain/no gain type of recovery so listen to your body and stretch into the range of motion that you can tolerate, stopping if you have pain.  If you are having immediate reconstruction, ask your plastic surgeon about doing exercises as he or she may want you to wait. .  While undergoing any medical procedure or treatment, try to avoid blood pressure being taken or needle sticks from occurring on the arm on the side of cancer.   This recommendation begins after surgery and continues for the rest of your life.  This may help reduce your risk of getting lymphedema (swelling in your arm). An excellent resource for those seeking information on lymphedema is the National Lymphedema Network's web site. It can be accessed at www.lymphnet.org If you notice swelling in your hand, arm or breast at any time following surgery (even if it is many years from now), please contact your doctor or occupational therapist to discuss this.  Lymphedema can be treated at any time but it is easier for you if it is treated early on.  If you feel like your shoulder motion is not returning to normal in a reasonable amount of time, please contact your surgeon or occupational therapist.  [facility] [phone]. [address], [place], [place] [address]        Ancel Peters, OTR/L,CLT 03/06/2024, 5:23 PM   "

## 2024-03-08 ENCOUNTER — Ambulatory Visit

## 2024-03-11 ENCOUNTER — Inpatient Hospital Stay

## 2024-03-12 ENCOUNTER — Inpatient Hospital Stay

## 2024-03-12 ENCOUNTER — Telehealth: Payer: Self-pay | Admitting: Oncology

## 2024-03-12 DIAGNOSIS — Z5111 Encounter for antineoplastic chemotherapy: Secondary | ICD-10-CM | POA: Diagnosis not present

## 2024-03-12 DIAGNOSIS — E860 Dehydration: Secondary | ICD-10-CM

## 2024-03-12 MED ORDER — SODIUM CHLORIDE 0.9 % IV SOLN
Freq: Once | INTRAVENOUS | Status: AC
  Filled 2024-03-12: qty 250

## 2024-03-12 NOTE — Telephone Encounter (Signed)
 Pt was scheduled at 1pm and is in parking lot, wants to be seen at 10am. I spoke with fluid clinic RN and was okayed to move pt to 10am. Confirmed with pt.

## 2024-03-12 NOTE — Addendum Note (Signed)
 Addended byBETHA KLUVER, Tanga Gloor L on: 03/12/2024 01:13 PM   Modules accepted: Orders

## 2024-03-15 ENCOUNTER — Inpatient Hospital Stay: Admitting: Nurse Practitioner

## 2024-03-15 ENCOUNTER — Telehealth: Payer: Self-pay | Admitting: *Deleted

## 2024-03-15 ENCOUNTER — Other Ambulatory Visit: Payer: Self-pay | Admitting: Oncology

## 2024-03-15 ENCOUNTER — Encounter: Payer: Self-pay | Admitting: Nurse Practitioner

## 2024-03-15 VITALS — BP 121/86 | HR 155 | Temp 98.1°F | Resp 16 | Wt 150.0 lb

## 2024-03-15 DIAGNOSIS — C50919 Malignant neoplasm of unspecified site of unspecified female breast: Secondary | ICD-10-CM

## 2024-03-15 DIAGNOSIS — L0212 Furuncle of neck: Secondary | ICD-10-CM | POA: Diagnosis not present

## 2024-03-15 DIAGNOSIS — Z5111 Encounter for antineoplastic chemotherapy: Secondary | ICD-10-CM | POA: Diagnosis not present

## 2024-03-15 MED ORDER — SULFAMETHOXAZOLE-TRIMETHOPRIM 800-160 MG PO TABS: 1.0000 | ORAL_TABLET | Freq: Two times a day (BID) | ORAL | 0 refills | Status: AC

## 2024-03-15 NOTE — Progress Notes (Signed)
 DISCONTINUE ON PATHWAY REGIMEN - Breast     Cycle 1: A cycle is 21 days:     Pertuzumab       Trastuzumab -xxxx      Docetaxel       Carboplatin     Cycles 2 through 6: A cycle is every 21 days:     Pertuzumab       Trastuzumab -xxxx      Docetaxel       Carboplatin    **Always confirm dose/schedule in your pharmacy ordering system**  PRIOR TREATMENT: [id number]: Docetaxel  + Carboplatin  + Trastuzumab  IV + Pertuzumab  IV (TCHP IV) q21 Days x 6 Cycles  START ON PATHWAY REGIMEN - Breast     A cycle is every 21 days:     Trastuzumab -xxxx      Pertuzumab -xxxx   **Always confirm dose/schedule in your pharmacy ordering system**  Patient Characteristics: Post-Neoadjuvant Therapy and Resection, M0, HER2 Positive, ER Negative, No Residual Disease, Adjuvant Targeted Therapy After Neoadjuvant Chemo/Targeted Therapy Therapeutic Status: Post-Neoadjuvant Therapy and Resection, M0 ER Status: Negative (-) PR Status: Negative (-) HER2 Status: Positive (+) Residual Invasive Disease Post-Neoadjuvant Therapy<= No Intent of Therapy: Curative Intent, Discussed with Patient

## 2024-03-15 NOTE — Progress Notes (Signed)
 "  Symptom Management Clinic  [facility] A Department of the [place]. [facility] [address] [place], [place] [address] [phone] (phone) [phone] (fax)  Patient Care Team: Myrla Jon HERO, MD as PCP - General (Family Medicine) End, Lonni, MD as PCP - Cardiology (Cardiology) Jacobo Evalene PARAS, MD as Consulting Physician (Oncology)   Name of the patient: Annette Phillips  [id number]  24-Dec-1965   Date of visit: 03/15/24  Diagnosis-breast cancer  Chief complaint/ Reason for visit-skin lesion  Heme/Onc history:  Oncology History  Invasive carcinoma of breast (HCC)  09/18/2023 Mammogram   Bilateral screening mammogram  In the right breast, possible asymmetries warrant further evaluation. In the left breast, no findings suspicious for malignancy.   09/22/2023 Mammogram   Right breast diagnostic mammogram   1. There is a highly suspicious 10 mm mass in the RIGHT upper inner breast. Recommend ultrasound-guided biopsy for definitive characterization. 2. There is a sonographically identified additional 6 mm mass in the RIGHT upper inner breast which is indeterminate. Recommend ultrasound-guided biopsy for definitive characterization. 3. No suspicious RIGHT axillary adenopathy.   09/28/2023 Initial Diagnosis   Invasive carcinoma of breast (HCC)  S/p right breast biopsy.   1. Breast, right, needle core biopsy, 2:00 6cmfn 10mm (ribbon clip) - INVASIVE DUCTAL CARCINOMA, SEE NOTE - TUBULE FORMATION: SCORE 3 - NUCLEAR PLEOMORPHISM: SCORE 3 - MITOTIC COUNT: SCORE 3 - TOTAL SCORE: 9 - OVERALL GRADE: 3 - LYMPHOVASCULAR INVASION: NOT IDENTIFIED - CANCER LENGTH: 0.7 CM - CALCIFICATIONS: NOT IDENTIFIED - OTHER FINDINGS: NONE 2. Breast, right, needle core biopsy, 1:00 3cmfn 6mm (venus clip) - INVASIVE DUCTAL CARCINOMA, SEE NOTE - TUBULE FORMATION: SCORE 3 - NUCLEAR PLEOMORPHISM: SCORE 3 - MITOTIC COUNT:  SCORE 3 - TOTAL SCORE: 9 - OVERALL GRADE: 3 - LYMPHOVASCULAR INVASION: NOT IDENTIFIED - CANCER LENGTH: 0.5 CM - CALCIFICATIONS: NOT IDENTIFIED - OTHER FINDINGS: NONE  1. 1) Breast, right, needle core biopsy, 2:00 6 cmfn 10mm ( ribbon clip) PROGNOSTIC INDICATORS Results: IMMUNOHISTOCHEMICAL AND MORPHOMETRIC ANALYSIS PERFORMED MANUALLY The tumor cells are POSITIVE for Her2 (3+). Estrogen Receptor: 0%, NEGATIVE Progesterone Receptor: 0%, NEGATIVE Proliferation Marker Ki67: 50%     10/13/2023 Cancer Staging   Staging form: Breast, AJCC 8th Edition - Clinical stage from 10/13/2023: Stage IIA (cT2, cN0, cM0, G3, ER-, PR-, HER2+) - Signed by [name], MD on 10/24/2023 Stage prefix: Initial diagnosis Histologic grading system: 3 grade system   10/20/2023 Imaging   Bilateral MRI breast with and without contrast showed 1. Two sites of UPPER-OUTER RIGHT breast biopsy-proven malignancy spanning a total distance of 4.2 cm (see above). 2. No other MR evidence of malignancy within either breast. No abnormal appearing lymph nodes.   11/14/2023 - 03/06/2024 Chemotherapy   Patient is on Treatment Plan : BREAST  Docetaxel  + Carboplatin  + Trastuzumab  + Pertuzumab   (TCHP) q21d      05/06/2024 -  Chemotherapy   Patient is on Treatment Plan : BREAST Trastuzumab   + Pertuzumab  q21d x 13 cycles       Interval history- Annette Phillips is a 59 year old female with above history of breast cancer status post TCHP chemotherapy awaiting surgery next month, scheduled for February 23 with Dr. Rodolph, who presents to symptom management clinic for complaints of a wound on her neck.  She has been previously seen for a rash on her neck but developed a lesion on Tuesday which has enlarged.  It is painful.  She has been applying  Vaseline to it and using warm compresses but feels that it is growing.  No fevers or chills and she otherwise feels well.  She expresses a preference to hold off on starting maintenance therapy  until 4 weeks after her surgery.  She feels that this will allow her body to rest before surgery and potentially her postsurgery recovery.  Review of systems- Review of Systems  Constitutional:  Negative for chills, fever, malaise/fatigue and weight loss.  Respiratory:  Negative for shortness of breath and wheezing.   Genitourinary:  Negative for dysuria and urgency.  Skin:  Negative for itching and rash.  Neurological:  Negative for weakness and headaches.  Psychiatric/Behavioral:  Negative for depression. The patient is not nervous/anxious and does not have insomnia.      Allergies[1]  Past Medical History:  Diagnosis Date   Allergy    Penicillin, Aspirin   Anemia July 2023   Blood transfusion without reported diagnosis July 2023   Cancer [facility])    Goiter    Hypothyroid    Internal hemorrhoid, bleeding 12/2022   Iron  deficiency anemia    Pre-diabetes    Recurrent cold sores    Sinus tachycardia     Past Surgical History:  Procedure Laterality Date   APPENDECTOMY     BREAST BIOPSY Right 05/21/2009   benign   BREAST BIOPSY Right 09/28/2023   US  RT BREAST BX W LOC DEV 1ST LESION IMG BX SPEC US  GUIDE 09/28/2023 [facility]   BREAST BIOPSY Right 09/28/2023   US  RT BREAST BX W LOC DEV EA ADD LESION IMG BX SPEC US  GUIDE 09/28/2023 [facility]   CHOLECYSTECTOMY N/A 02/28/2019   Procedure: LAPAROSCOPIC CHOLECYSTECTOMY;  Surgeon: Desiderio Schanz, MD;  Location: [facility] OR[facility];  Service: General;  Laterality: N/A;   COLONOSCOPY WITH PROPOFOL  N/A 09/14/2021   Procedure: COLONOSCOPY WITH PROPOFOL ;  Surgeon: Jinny Carmine, MD;  Location: [facility] ENDOSCOPY;  Service: Endoscopy;  Laterality: N/A;   DE QUERVAIN'S RELEASE Right 06/2022   ESOPHAGOGASTRODUODENOSCOPY (EGD) WITH PROPOFOL  N/A 02/27/2019   Procedure: ESOPHAGOGASTRODUODENOSCOPY (EGD) WITH PROPOFOL ;  Surgeon: Unk Corinn Skiff, MD;  Location: [facility] ENDOSCOPY;  Service: Gastroenterology;  Laterality: N/A;   ESOPHAGOGASTRODUODENOSCOPY  (EGD) WITH PROPOFOL  N/A 09/14/2021   Procedure: ESOPHAGOGASTRODUODENOSCOPY (EGD) WITH PROPOFOL ;  Surgeon: Jinny Carmine, MD;  Location: [facility] ENDOSCOPY;  Service: Endoscopy;  Laterality: N/A;   GIVENS CAPSULE STUDY N/A 09/15/2021   Procedure: GIVENS CAPSULE STUDY;  Surgeon: Jinny Carmine, MD;  Location[facility] ENDOS[facility];  Service: Endoscopy;  Laterality: N/A;   HEMORRHOID BANDING  10/20/2022   HEMORRHOID SURGERY N/A 12/28/2022   Procedure: HEMORRHOIDECTOMY;  Surgeon: Rodolph Romano, MD;  Location: [facility] ORS;  Service: General;  Laterality: N/A;   KNEE ARTHROSCOPY Left 01/04/2007   torn meniscus   PORT A CATH REVISION Left 12/01/2023   Procedure: PORT A CATH REVISION;  Surgeon: Rodolph Romano, MD;  Location: [facility] ORS;  Service: General;  Laterality: Left;   PORTACATH PLACEMENT N/A 11/08/2023   Procedure: INSERTION, TUNNELED CENTRAL VENOUS DEVICE, WITH PORT;  Surgeon: Rodolph Romano, MD;  Location: [facility] ORS;  Service: General;  Laterality: N/A;   VITRECTOMY Right 09/2022    Social History   Socioeconomic History   Marital status: Single    Spouse name: Not on file   Number of children: 3   Years of education: Not on file   Highest education level: Bachelor's degree (e.g., BA, AB, BS)  Occupational History   Not on file  Tobacco Use   Smoking status: Never   Smokeless  tobacco: Never  Vaping Use   Vaping status: Never Used  Substance and Sexual Activity   Alcohol use: Never   Drug use: Never   Sexual activity: Not Currently    Birth control/protection: Abstinence, Post-menopausal  Other Topics Concern   Not on file  Social History Narrative   Lives alone   Social Drivers of Health   Tobacco Use: Low Risk (03/15/2024)   Patient History    Smoking Tobacco Use: Never    Smokeless Tobacco Use: Never    Passive Exposure: Not on file  Financial Resource Strain: Low Risk (07/31/2023)   Overall Financial Resource Strain (CARDIA)    Difficulty of Paying Living  Expenses: Not hard at all  Food Insecurity: No Food Insecurity (11/22/2023)   Epic    Worried About Programme Researcher, Broadcasting/film/video in the Last Year: Never true    Ran Out of Food in the Last Year: Never true  Transportation Needs: No Transportation Needs (11/22/2023)   Epic    Lack of Transportation (Medical): No    Lack of Transportation (Non-Medical): No  Physical Activity: Sufficiently Active (07/31/2023)   Exercise Vital Sign    Days of Exercise per Week: 4 days    Minutes of Exercise per Session: 50 min  Stress: No Stress Concern Present (07/31/2023)   [employer] of Occupational Health - Occupational Stress Questionnaire    Feeling of Stress: Not at all  Social Connections: Moderately Integrated (11/22/2023)   Social Connection and Isolation Panel    Frequency of Communication with Friends and Family: More than three times a week    Frequency of Social Gatherings with Friends and Family: More than three times a week    Attends Religious Services: More than 4 times per year    Active Member of Clubs or Organizations: Yes    Attends Banker Meetings: More than 4 times per year    Marital Status: Divorced  Intimate Partner Violence: Not on file  Depression (PHQ2-9): Low Risk (03/15/2024)   Depression (PHQ2-9)    PHQ-2 Score: 0  Alcohol Screen: Low Risk (05/24/2022)   Alcohol Screen    Last Alcohol Screening Score (AUDIT): 0  Housing: Unknown (03/05/2024)   Received from [facility]   Epic    Unable to Pay for Housing in the Last Year: Not on file    Number of Times Moved in the Last Year: Not on file    At any time in the past 12 months, were you homeless or living in a shelter (including now)?: No  Utilities: Not At Risk (11/22/2023)   Epic    Threatened with loss of utilities: No  Health Literacy: Not on file    Family History  Problem Relation Age of Onset   Thyroid  disease Mother    Diabetes Mellitus II Mother    Diabetes Mother    Thyroid   disease Father    Coronary artery disease Father 63       CABG   Healthy Sister    Kidney cancer Maternal Grandmother    Lung cancer Maternal Grandfather        smoker   Hypothyroidism Daughter    Diabetes Son    Hypothyroidism Son    Diabetes Mellitus I Son    Colon cancer Neg Hx    Breast cancer Neg Hx     Current Medications[2]  Physical exam:  Vitals:   03/15/24 1101  BP: 121/86  Pulse: (!) 155  Resp: 16  Temp: 98.1 F (36.7 C)  TempSrc: Tympanic  SpO2: 98%  Weight: 150 lb (68 kg)   Physical Exam Vitals reviewed.  Constitutional:      Appearance: She is not ill-appearing.  Skin:    Coloration: Skin is not pale.     Findings: Lesion present. No bruising.  Neurological:     Mental Status: She is alert and oriented to person, place, and time.         No results found.  Assessment and plan- Patient is a 59 y.o. female diagnosed with breast cancer, s/p neoadjuvant TCHP chemotherapy completed on 03/04/2024.  Presents to symptom management clinic for complaints of  1.  Skin abscess-approximately 1 cm uncomplicated skin abscess.  The lesion is not warm.  No fever or systemic symptoms. Penicillin allergic.  Given her recent chemotherapy and for potentially impaired host defenses antibiotics are reasonable.  Recommend Bactrim  1 tablet twice a day for 7 days to cover for MRSA which is the most common pathogen in skin abscesses.  I have also reached out to general surgery to see if she might be a candidate for I&D.   2.  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 Jacobo.  She has a posttreatment echo scheduled for February.  Plan is for year-long maintenance therapy with Herceptin  and Perjeta .  Previously plan was to start in February however, her preference is to delay starting maintenance treatment until 4 weeks after her surgery which is scheduled  for February 23.  I spoke with Dr. Jacobo and he is okay with this change.  Treatment plan updated.  Disposition: Return to clinic if symptoms worsen or fail to improve. Updated treatment plan and follow-up with Dr. Jacobo as above.   Visit Diagnosis 1. Boil of neck   2. Invasive carcinoma of breast [facility])     Patient expressed understanding and was in agreement with this plan. She also understands that She can call clinic at any time with any questions, concerns, or complaints.   Thank you for allowing me to participate in the care of this very pleasant patient.   Tinnie Dawn, DNP, AGNP-C, AOCNP [facility] [phone]     [1]  Allergies Allergen Reactions   Aspirin Other (See Comments)    Childhood Allergy    Penicillin G Other (See Comments)    Childhood Allergy   [2]  Current Outpatient Medications:    acetaminophen  (TYLENOL ) 500 MG tablet, Take 1,000 mg by mouth every 6 (six) hours as needed (pain.)., Disp: , Rfl:    doxepin  (SINEQUAN ) 10 MG/ML solution, Take 2.5 ml [25mg ] in 5 ml of water ; Oral rinse for 1 minute then spit. Repeat 3-6 times a day., Disp: , Rfl:    levothyroxine  (SYNTHROID ) 88 MCG tablet, TAKE 1 TABLET BY MOUTH DAILY BEFORE BREAKFAST., Disp: 90 tablet, Rfl: 2   loratadine (CLARITIN) 10 MG tablet, Take 10 mg by mouth daily., Disp: , Rfl:    metoprolol  tartrate (LOPRESSOR ) 25 MG tablet, Take 1 tablet (25 mg total) by mouth 2 (two) times daily., Disp: 90 tablet, Rfl: 3   Multiple Vitamin (MULTIVITAMIN WITH MINERALS) TABS tablet, Take 1 tablet by mouth in the morning., Disp: , Rfl:    Psyllium (METAMUCIL PO), Take 3 capsules by mouth in the morning and at bedtime. 2  to 3 capules, Disp: , Rfl:    sulfamethoxazole -trimethoprim  (BACTRIM  DS) 800-160 MG tablet, Take 1  tablet by mouth 2 (two) times daily for 7 days., Disp: 14 tablet, Rfl: 0   triamcinolone  cream (KENALOG ) 0.1 %, Apply 1 Application topically 2 (two) times daily., Disp: 30  g, Rfl: 0   valACYclovir  (VALTREX ) 500 MG tablet, Take 2 tablets (1000 mg total) by mouth 2 times a day at first signs of outbreak and continue for 7 days. Reduce to 2 tablets (1000 mg) by mouth once a day for suppression., Disp: 180 tablet, Rfl: 0   fluconazole  (DIFLUCAN ) 100 MG tablet, Take 1 tablet (100 mg total) by mouth daily. (Patient not taking: Reported on 03/15/2024), Disp: 3 tablet, Rfl: 0   hydrocortisone  1 % ointment, Apply sparingly 1-2 times a day for no more than 2 weeks at a time for chemotherapy induced rash. (Patient not taking: Reported on 03/15/2024), Disp: 30 g, Rfl: 0   ibuprofen  (ADVIL ) 200 MG tablet, Take 400-600 mg by mouth every 8 (eight) hours as needed (pain.). (Patient not taking: Reported on 03/15/2024), Disp: , Rfl:    loperamide  (IMODIUM ) 2 MG capsule, Take 1 capsule (2 mg total) by mouth See admin instructions. Initial: 4 mg,the 2 mg every 2 hours (4 mg every 4 hours at night)  maximum: 16 mg/day (Patient not taking: Reported on 03/15/2024), Disp: 60 capsule, Rfl: 2  "

## 2024-03-15 NOTE — Telephone Encounter (Signed)
 Patient accepted apt today at 11 am with Tinnie Dawn, NP.

## 2024-03-15 NOTE — Telephone Encounter (Signed)
" °  Caller verified using pt's full name and dob prior to discussing PHI     NURSING SYMPTOM TRIAGE ASSESSMENT & DISPOSITION  Mode of interaction:  Telephone Date of symptom triage interaction:  03/15/24 Time of symptom triage interaction:  [id number]  Cancer diagnosis:  Invasive carcinoma of breast (HCC) Patient is on Treatment Plan :  BREAST  Docetaxel  + Carboplatin  + Trastuzumab  + Pertuzumab   (TCHP) q21d   Last treatment date:  03/04/24 Oral chemotherapy:  No  Symptom Triage Protocol:  Boil on the back of her neck  Patient reports multiple boils on the back of her neck and near her R ear. Sizes of boils range from a nickel to a quarter in size. The areas are red, hard and hot to touch and constant burning pain. She noticed the area of concern on Tuesday evening this week.   Sore on the back of her neck and head aera. Hot to touch. Hard to touch and painful.  Denies any trauma or friction or irritation to the area. Denies an insect bites. No h/o MRSA. She stated that she has not even been wearing a wig lately to contribute to wounds/boils.  She has not had a boil or similar skin infection before. Denies any drainage from boils, but she stated that the boils look like the could pop. Denies any chills, fever or unusual fatigue. Denies any nausea. For home care attempts, she has tried warm compresses and used vaseline on the boils in hopes to help with discomfort. She doesn't have any relief. She would like to be evaluated if possible to [facility]. No other rashes noted. She is prediabetic. Last treatment    Provider Consulted  Provider name and credentials: Msg sent to Dr. Jacobo and APPs.  Provider instruction: smc    Nurse Triage Priority:  Urgent (obtain medical orders as indicated with instruction for 8-24 hour or sooner follow-up)  Barriers to Care:  None identified  Nurse Triage Disposition:  In-person follow-up visit:  today at 11 am with Tinnie Dawn, NP  Protocol Source:  Ruthine HERO., &  Eldonna CANDIE Pee.). (2019). Telephone triage for oncology nurses (3rd ed.). Oncology Nursing Society.     "

## 2024-03-21 ENCOUNTER — Ambulatory Visit
Admission: RE | Admit: 2024-03-21 | Discharge: 2024-03-21 | Disposition: A | Source: Ambulatory Visit | Attending: Oncology | Admitting: Oncology

## 2024-03-21 DIAGNOSIS — Z0189 Encounter for other specified special examinations: Secondary | ICD-10-CM | POA: Diagnosis not present

## 2024-03-21 DIAGNOSIS — C50919 Malignant neoplasm of unspecified site of unspecified female breast: Secondary | ICD-10-CM

## 2024-03-21 LAB — ECHOCARDIOGRAM COMPLETE
AR max vel: 2.87 cm2
AV Area VTI: 2.62 cm2
AV Area mean vel: 2.86 cm2
AV Mean grad: 3 mmHg
AV Peak grad: 5 mmHg
Ao pk vel: 1.12 m/s
S' Lateral: 2.9 cm

## 2024-03-25 ENCOUNTER — Inpatient Hospital Stay

## 2024-03-25 ENCOUNTER — Inpatient Hospital Stay: Admitting: Oncology

## 2024-03-26 ENCOUNTER — Inpatient Hospital Stay: Admission: RE | Admit: 2024-03-26

## 2024-04-08 ENCOUNTER — Ambulatory Visit: Admit: 2024-04-08 | Admitting: General Surgery

## 2024-04-15 ENCOUNTER — Inpatient Hospital Stay

## 2024-04-15 ENCOUNTER — Inpatient Hospital Stay: Admitting: Oncology

## 2024-05-01 ENCOUNTER — Inpatient Hospital Stay: Admitting: Occupational Therapy

## 2024-05-03 ENCOUNTER — Ambulatory Visit: Admitting: Physician Assistant

## 2024-05-06 ENCOUNTER — Inpatient Hospital Stay

## 2024-05-06 ENCOUNTER — Inpatient Hospital Stay: Admitting: Oncology

## 2024-05-27 ENCOUNTER — Inpatient Hospital Stay

## 2024-05-27 ENCOUNTER — Inpatient Hospital Stay: Admitting: Oncology

## 2024-06-17 ENCOUNTER — Inpatient Hospital Stay

## 2024-06-17 ENCOUNTER — Inpatient Hospital Stay: Admitting: Oncology

## 2024-08-08 ENCOUNTER — Encounter: Admitting: Family Medicine
